# Patient Record
Sex: Female | Born: 1950
Health system: Southern US, Community
[De-identification: ages and names within clinical notes are randomized; demographics above are authoritative.]

## PROBLEM LIST (undated history)

## (undated) DIAGNOSIS — E039 Hypothyroidism, unspecified: Secondary | ICD-10-CM

## (undated) DIAGNOSIS — B9681 Helicobacter pylori [H. pylori] as the cause of diseases classified elsewhere: Secondary | ICD-10-CM

## (undated) DIAGNOSIS — E079 Disorder of thyroid, unspecified: Secondary | ICD-10-CM

## (undated) DIAGNOSIS — E785 Hyperlipidemia, unspecified: Secondary | ICD-10-CM

## (undated) DIAGNOSIS — K219 Gastro-esophageal reflux disease without esophagitis: Secondary | ICD-10-CM

## (undated) DIAGNOSIS — E119 Type 2 diabetes mellitus without complications: Secondary | ICD-10-CM

## (undated) DIAGNOSIS — K279 Peptic ulcer, site unspecified, unspecified as acute or chronic, without hemorrhage or perforation: Secondary | ICD-10-CM

## (undated) DIAGNOSIS — J449 Chronic obstructive pulmonary disease, unspecified: Secondary | ICD-10-CM

## (undated) DIAGNOSIS — G4730 Sleep apnea, unspecified: Secondary | ICD-10-CM

## (undated) DIAGNOSIS — M1990 Unspecified osteoarthritis, unspecified site: Secondary | ICD-10-CM

## (undated) DIAGNOSIS — I10 Essential (primary) hypertension: Secondary | ICD-10-CM

## (undated) HISTORY — PX: REPLACEMENT TOTAL KNEE BILATERAL: SUR1225

## (undated) HISTORY — DX: Type 2 diabetes mellitus without complications: E11.9

## (undated) HISTORY — DX: Disorder of thyroid, unspecified: E07.9

## (undated) HISTORY — DX: Helicobacter pylori (H. pylori) as the cause of diseases classified elsewhere: B96.81

## (undated) HISTORY — DX: Essential (primary) hypertension: I10

## (undated) HISTORY — DX: Peptic ulcer, site unspecified, unspecified as acute or chronic, without hemorrhage or perforation: K27.9

## (undated) HISTORY — DX: Chronic obstructive pulmonary disease, unspecified: J44.9

## (undated) HISTORY — PX: TUBAL LIGATION: SHX77

## (undated) HISTORY — DX: Hyperlipidemia, unspecified: E78.5

## (undated) HISTORY — DX: Unspecified osteoarthritis, unspecified site: M19.90

## (undated) HISTORY — PX: LAPAROSCOPIC GASTRIC BANDING: SHX1100

---

## 2004-02-04 ENCOUNTER — Other Ambulatory Visit: Admission: RE | Admit: 2004-02-04 | Discharge: 2004-02-04 | Payer: Self-pay | Admitting: Dermatology

## 2005-10-06 ENCOUNTER — Ambulatory Visit: Payer: Self-pay | Admitting: Cardiology

## 2005-10-06 ENCOUNTER — Observation Stay (HOSPITAL_COMMUNITY): Admission: EM | Admit: 2005-10-06 | Discharge: 2005-10-07 | Payer: Self-pay | Admitting: Emergency Medicine

## 2005-10-07 ENCOUNTER — Encounter: Payer: Self-pay | Admitting: Cardiology

## 2008-04-22 ENCOUNTER — Ambulatory Visit: Payer: Self-pay | Admitting: Orthopedic Surgery

## 2008-04-22 DIAGNOSIS — M1990 Unspecified osteoarthritis, unspecified site: Secondary | ICD-10-CM

## 2008-04-22 DIAGNOSIS — M545 Low back pain, unspecified: Secondary | ICD-10-CM | POA: Insufficient documentation

## 2008-04-25 ENCOUNTER — Telehealth: Payer: Self-pay | Admitting: Orthopedic Surgery

## 2008-07-22 ENCOUNTER — Encounter (INDEPENDENT_AMBULATORY_CARE_PROVIDER_SITE_OTHER): Payer: Self-pay | Admitting: *Deleted

## 2008-08-01 ENCOUNTER — Ambulatory Visit (HOSPITAL_COMMUNITY): Admission: RE | Admit: 2008-08-01 | Discharge: 2008-08-01 | Payer: Self-pay | Admitting: *Deleted

## 2008-08-11 ENCOUNTER — Ambulatory Visit (HOSPITAL_COMMUNITY): Admission: RE | Admit: 2008-08-11 | Discharge: 2008-08-11 | Payer: Self-pay | Admitting: *Deleted

## 2008-08-11 ENCOUNTER — Encounter: Admission: RE | Admit: 2008-08-11 | Discharge: 2008-08-11 | Payer: Self-pay | Admitting: *Deleted

## 2008-09-23 ENCOUNTER — Encounter: Admission: RE | Admit: 2008-09-23 | Discharge: 2008-09-23 | Payer: Self-pay | Admitting: *Deleted

## 2008-10-29 ENCOUNTER — Ambulatory Visit (HOSPITAL_COMMUNITY): Admission: RE | Admit: 2008-10-29 | Discharge: 2008-10-29 | Payer: Self-pay | Admitting: *Deleted

## 2009-02-26 ENCOUNTER — Encounter: Admission: RE | Admit: 2009-02-26 | Discharge: 2009-05-27 | Payer: Self-pay | Admitting: Surgery

## 2009-03-30 ENCOUNTER — Ambulatory Visit (HOSPITAL_COMMUNITY): Admission: RE | Admit: 2009-03-30 | Discharge: 2009-03-31 | Payer: Self-pay | Admitting: Surgery

## 2009-05-15 ENCOUNTER — Encounter: Admission: RE | Admit: 2009-05-15 | Discharge: 2009-05-15 | Payer: Self-pay | Admitting: Surgery

## 2009-06-24 ENCOUNTER — Encounter: Admission: RE | Admit: 2009-06-24 | Discharge: 2009-07-15 | Payer: Self-pay | Admitting: Surgery

## 2009-09-15 ENCOUNTER — Ambulatory Visit: Payer: Self-pay | Admitting: Vascular Surgery

## 2009-10-05 ENCOUNTER — Ambulatory Visit: Payer: Self-pay | Admitting: Vascular Surgery

## 2009-10-14 ENCOUNTER — Ambulatory Visit: Payer: Self-pay | Admitting: Vascular Surgery

## 2009-11-09 ENCOUNTER — Ambulatory Visit: Payer: Self-pay | Admitting: Vascular Surgery

## 2009-11-17 ENCOUNTER — Ambulatory Visit: Payer: Self-pay | Admitting: Vascular Surgery

## 2010-10-22 LAB — DIFFERENTIAL
Basophils Relative: 0 % (ref 0–1)
Basophils Relative: 0 % (ref 0–1)
Eosinophils Absolute: 0.1 10*3/uL (ref 0.0–0.7)
Eosinophils Absolute: 0.1 10*3/uL (ref 0.0–0.7)
Eosinophils Relative: 1 % (ref 0–5)
Eosinophils Relative: 1 % (ref 0–5)
Lymphs Abs: 2 10*3/uL (ref 0.7–4.0)
Monocytes Absolute: 0.7 10*3/uL (ref 0.1–1.0)
Monocytes Relative: 7 % (ref 3–12)
Neutro Abs: 5.3 10*3/uL (ref 1.7–7.7)
Neutro Abs: 6 10*3/uL (ref 1.7–7.7)

## 2010-10-22 LAB — GLUCOSE, CAPILLARY
Glucose-Capillary: 106 mg/dL — ABNORMAL HIGH (ref 70–99)
Glucose-Capillary: 85 mg/dL (ref 70–99)
Glucose-Capillary: 96 mg/dL (ref 70–99)

## 2010-10-22 LAB — COMPREHENSIVE METABOLIC PANEL
ALT: 23 U/L (ref 0–35)
AST: 22 U/L (ref 0–37)
Albumin: 3.7 g/dL (ref 3.5–5.2)
BUN: 28 mg/dL — ABNORMAL HIGH (ref 6–23)
CO2: 27 mEq/L (ref 19–32)
Calcium: 9.8 mg/dL (ref 8.4–10.5)
Creatinine, Ser: 0.94 mg/dL (ref 0.4–1.2)
GFR calc Af Amer: >60 mL/min (ref >60)
Glucose, Bld: 90 mg/dL (ref 70–99)
Potassium: 4.8 mEq/L (ref 3.5–5.1)
Total Bilirubin: 0.5 mg/dL (ref 0.3–1.2)
Total Protein: 7.4 g/dL (ref 6.0–8.3)

## 2010-10-22 LAB — CBC
Hemoglobin: 12.4 g/dL (ref 12.0–15.0)
MCHC: 32.6 g/dL (ref 30.0–36.0)
MCHC: 33.2 g/dL (ref 30.0–36.0)
MCV: 84.8 fL (ref 78.0–100.0)
RBC: 4.39 MIL/uL (ref 3.87–5.11)
RDW: 14 % (ref 11.5–15.5)

## 2010-10-22 LAB — HEMOGLOBIN AND HEMATOCRIT, BLOOD: Hemoglobin: 12.7 g/dL (ref 12.0–15.0)

## 2010-11-30 NOTE — Assessment & Plan Note (Signed)
OFFICE VISIT   CIIN, BRAZZEL  DOB:  [birth year]                                       11/09/2009  [id number]#:[id number]   This patient had laser ablation of her left great saphenous vein today  for severe venous hypertension with swelling, hyperpigmentation and  history of ulceration in the contralateral right leg which she has  already had treated.  Procedure was done without difficulty today and  she tolerated it well.  We ablated from mid calf to saphenofemoral  junction.  She will return in 1 week for venous duplex exam to confirm  closure.     Quita Skye Hart Rochester, M.D.  Electronically Signed   JDL/MEDQ  D:  11/09/2009  T:  11/10/2009  Job:  [id number]

## 2010-11-30 NOTE — Assessment & Plan Note (Signed)
OFFICE VISIT   TAMBI, Jennifer Rasmussen  DOB:  1951/05/21                                       10/05/2009  [id number]#:[id number]   Patient had laser ablation of her right great saphenous vein performed  today for severe venous insufficiency of the right leg with recurrent  stasis ulcers and severe hyperpigmentation of both legs.  No stab  phlebectomies were performed.  The stasis ulcer today measures about 5 x  2 cm in the pretibial area on the right.   She will return in 1 week for follow-up venous duplex exam and will have  the contralateral left leg done in the near future.     Quita Skye Hart Rochester, M.D.  Electronically Signed   JDL/MEDQ  D:  10/05/2009  T:  10/06/2009  Job:  [id number]

## 2010-11-30 NOTE — Assessment & Plan Note (Signed)
OFFICE VISIT   Jennifer, Rasmussen  DOB:  [birth year]                                       10/14/2009  [id number]#:[id number]   Here for followup today of her laser ablation of right great saphenous  vein by Dr. Hart Rochester on 10/05/2009.  She had severe venous hypertension  bilaterally.  She had the usual amount of soreness in her medial thigh.   On physical exam she has a nice healing of her pretibial wound on the  right ankle.  She has minimal soreness over her medial thigh.  She  underwent venous duplex today and this reveals closure of her great  saphenous vein and no evidence of DVT.  She will undergo left laser  ablation with Dr. Hart Rochester on 04/25 as planned.     Larina Earthly, M.D.  Electronically Signed   TFE/MEDQ  D:  10/14/2009  T:  10/15/2009  Job:  [id number]   cc:   Quita Skye. Hart Rochester, M.D.

## 2010-11-30 NOTE — Assessment & Plan Note (Signed)
OFFICE VISIT   Jennifer, Rasmussen  DOB:  [birth year]                                       11/17/2009  [id number]#:[id number]   The patient had laser ablation of her left great saphenous vein  performed on April 25, one week ago.  She had previously had the right  great saphenous vein closed on March 21.  She has a long history of  recurrent stasis ulcer in the right pretibial region and has had severe  changes of venous insufficiency in the lower third of the left leg.  The  stasis ulcer on the right leg is continuing to improve, is almost  healed, and a new Unna boot was applied today.  The ulcer measured about  0.5 x 1 cm in a triangular pattern.  The left leg is free of stasis  ulcers currently.  She continues to have chronic edema in the left leg.  She has had some mild to moderate tenderness where the laser ablation  was performed.   Venous duplex exam performed today reveals no evidence of deep venous  obstruction with total closure of the great saphenous vein from the knee  to the saphenofemoral junction.  She was reassured regarding these  findings.  She will have her Unna boots changed weekly in [place].  Blood pressure today is 133/78, heart rate 77, respirations 24.   She will return to see [place] on a p.r.n. basis.     Quita Skye Hart Rochester, M.D.  Electronically Signed   JDL/MEDQ  D:  11/17/2009  T:  11/18/2009  Job:  [id number]

## 2010-11-30 NOTE — Procedures (Signed)
LOWER EXTREMITY VENOUS REFLUX EXAM   INDICATION:  Bilateral lower extremity swelling, redness, right lower  extremity ulcer, and discoloration.   EXAM:  Using color-flow imaging and pulse Doppler spectral analysis, the  bilateral common femoral, superficial femoral, popliteal, posterior  tibial, greater and lesser saphenous veins are evaluated.  There is no  evidence suggesting deep venous insufficiency in the bilateral lower  extremities.   The bilateral saphenofemoral junction is not competent with Reflux of  >555milliseconds. The bilateral GSV is not competent with Reflux of  >523milliseconds with the caliber as described below.)   The bilateral proximal short saphenous vein demonstrates competency.   GSV Diameter (used if found to be incompetent only)                                            Right    Left  Proximal Greater Saphenous Vein           1.24 cm  1.39 cm  Proximal-to-mid-thigh                     1.12 cm  0.91 cm  Mid thigh                                 0.87 cm  0.71 cm  Mid-distal thigh                          cm       cm  Distal thigh                              0.93 cm  0.73 cm  Knee                                      0.74 cm  0.77 cm   IMPRESSION:  1. Bilateral greater saphenous vein Reflux with >526milliseconds is      identified with the caliber ranging from 0.74 cm to 1.24 cm knee to      groin on the right and 0.77 to 1.39 cm on the left.  2. The bilateral greater saphenous veins are not aneurysmal.  3. The bilateral greater saphenous veins are not tortuous.  4. The deep venous system is competent.  5. The bilateral lesser saphenous veins are competent.  6. Anterior accessory saphenous veins are identified with reflux >500      milliseconds and measures 0.72 cm at the saphenofemoral junction      and 0.92 cm proximal thigh on the right and 0.67 cm saphenofemoral      junction and 0.34 proximal thigh on the left.         ___________________________________________  Quita Skye. Hart Rochester, M.D.   CJ/MEDQ  D:  09/15/2009  T:  09/15/2009  Job:  [id number]

## 2010-11-30 NOTE — Consult Note (Signed)
NEW PATIENT CONSULTATION   Jennifer Rasmussen, Jennifer Rasmussen  DOB:  1950-07-22                                       09/15/2009  [id number]#:[id number]   The patient is a 60 year old female with severe venous insufficiency of  both lower extremities with a history of recurrent stasis ulcers and  bleeding despite the use of elastic compression stockings.  This  patient, who states that she has had severe swelling and progressive  thickening of the skin of the lower third of both legs as well as  recurrent stasis ulcers over the last 15 years, describes 4 ulcers in  the last 7 months, most recent of which has bled 3 weeks ago.  Right  side has been more affected than the left but she has severe symptoms in  both lower extremities consisting of throbbing, aching and burning  discomfort as well as progressive swelling as the day begins and it  worsens as the day proceeds.  She had no history thrombophlebitis or  deep veinous venous thrombosis but has had recurrent stasis ulcers as  noted.  Wore elastic compression stockings with some improvement as long  as she could but she now has severe degenerative joint disease and  because of her left hip problem, she is unable to flex the hip enough to  get her stockings on.  She does not take pain medications but does  elevate her legs frequently and continues to have problems despite this.   CHRONIC MEDICAL PROBLEMS:  1. Diabetes mellitus.  2. Hypertension.  3. Hyperlipidemia.  4. COPD.  5. Degenerative joint disease, in need of the left hip replacement.  6. She has no history of coronary artery disease or stroke.   FAMILY HISTORY:  Positive for coronary artery disease in her father who  died of myocardial infarction in 2003, positive for diabetes in sister.  Negative for stroke.   SOCIAL HISTORY:  She is married, is disabled and retired.  She smokes  about a pack of cigarettes per week but does not use alcohol.   Other current medical  problem is morbid obesity.  She underwent a  laparoscopic gastric banding procedure several months ago and has lost  46.  Has orthopnea and dyspnea on exertion, is on home oxygen at night,  productive cough, uses CPAP in the evenings; chronic constipation.  Leg  discomfort with walking, history of arthritis, joint pain.  All other  systems review in the review of systems are negative.   PHYSICAL EXAMINATION:  Blood pressure 135/78, heart rate 64,  respirations 14, temperature is 98.  General:  She is an obese female  who is in no apparent distress, alert and oriented x3.  HEENT:  EOMs  intact.  Conjunctivae normal.  Lungs:  Clear with some mild rhonchi.  Cardiovascular:  Regular rhythm.  No murmurs.  Carotid pulses 3+, no  audible bruits.  Abdomen:  Soft, nontender with no masses.  Musculoskeletal:  Exam reveals decreased range of motion left hip; no  other abnormalities noted.  Neurologic:  Normal.  Skin:  Exam of the  lower extremities reveals severe thickening, hyperpigmentation and  dermatosclerosis involving the lower third of the legs in a  circumferential fashion, worse on the right than the left, with an  active ulcer on the right side measuring about 5 mm in diameter.  There  is 2-3+  bilaterally with chronic stigmata of venous insufficiency.   Today I ordered a venous duplex exam of both lower extremities which I  have reviewed and interpreted.  She has no evidence of deep venous  obstruction but has gross reflux in both great saphenous veins from the  mid calf to the saphenofemoral junction including the lateral branches  bilaterally.   This patient has severe venous insufficiency with multiple ulcerations  and severe symptoms and is unable to wear elastic compression stockings  because of her hip pathology and the size of her legs.  She badly needs  laser ablation of both great saphenous veins to be performed in the near  future.  We will proceed with precertification to  hopefully heal the  ulcerations and relieve her symptoms.     Quita Skye Hart Rochester, M.D.  Electronically Signed   JDL/MEDQ  D:  09/15/2009  T:  09/16/2009  Job:  [id number]   cc:   Ernestina Penna, M.D.  Belva Agee, NP

## 2010-12-03 ENCOUNTER — Encounter: Payer: Self-pay | Admitting: Nurse Practitioner

## 2010-12-03 NOTE — H&P (Signed)
NAMESHIZUE, KASEMAN              ACCOUNT NO.:  [id number]   MEDICAL RECORD NO.:  [id number]          PATIENT TYPE:  INP   LOCATION:  2013                         FACILITY:  [facility]   PHYSICIAN:  Rollene Rotunda, M.D.   DATE OF BIRTH:  03/29/1951   DATE OF ADMISSION:  10/06/2005  DATE OF DISCHARGE:                                HISTORY & PHYSICAL   SUMMARY OF HISTORY:  Jennifer Rasmussen is a 60 year old white female who was  referred from her primary MD's office secondary to shortness of breath. Ms.  Beazer states that, since this past Tuesday, she has noticed an increased  dry cough, low grade temperature of approximately 99, chills, shortness of  breath, increased heart rate, and superior and posterior sharp pains with  coughing. She states that on Wednesday all her symptoms gradually became  worse, and she made an appointment to see Paulita Cradle in the office  today. When she got up this morning, her symptoms were particularly worse.  She was seen in the office by Paulita Cradle, nurse practitioner, and  referred to the ER for cardiology evaluation to delineate between  reoccurring bronchitis for pulmonary edema.   Ms. Valls describes chronic orthopnea. This has not changed. She also  describes chronic pedal edema for which she wears compression hose on her  feet. She states that this is not changed. She denies any weight gain, PND.  She does describe some hand swelling which she states is new for her.   She also describes a wellness visit approximately 2-3 weeks ago with Paulita Cradle. Blood work and chest x-ray were obtained at that time. Her  hydrochlorothiazide was changed to Lasix for the swelling, and this is  felt may be due to [facility] which was discontinued, and Glucophage was  started; however, the patient states that she was converted back to  [facility] because she did not like taking the Glucophage. It did not make  her feel good.   PAST MEDICAL HISTORY:  Is  notable for no known drug allergies.   MEDICATIONS:  1.  Aspirin 81 mg daily.  2.  Zocor, unknown dose at bedtime.  3.  Lasix 20 mg daily which started 2 weeks ago.  4.  Zestril 10 mg daily.  5.  Advair 258/50 twice daily.  6.  Mobic 15 mg daily.  7.  Avandaryl pill 4 daily.   She has a history of COPD. PFTs were performed on May 05, 2005, that  showed FEV1 of 70% predicted. She has been reported in the past to see Dr.  Egbert Garibaldi for remote pulmonary evaluation; history of hypertension. She does not  check her blood pressures at home.  Hyperlipidemia, last check was on September 17, 2005, showed total cholesterol 183, triglycerides 173, HDL 48, LDL 100,  obesity, type 2 diabetes diagnosed in September 2006, hemoglobin A1c in  October was 7.2; on September 20, 2005, it was 5.78. States glucose has been  less than 100 at home. She has also been diagnosed with obstructive sleep  apnea for which she is on CPAP and home oxygen at night.  Status post  bilateral knee replacement. She had peripheral vascular evaluation with Dr.  Edilia Bo, with [facility], and it sounds like she has had ABIs in the past  secondary to lower extremity ulcers. Information is currently not available.  However, based on history, it sounds like she has a diagnosis of venous  insufficiency.   SOCIAL HISTORY:  She resides in [place] with her husband. She does not have  any children. She is on disability and retired as a Curator from [employer]  She has not smoked in approximately 1 week; prior to that she smoked 1-1/2  packs per day for 20 years. She denies any alcohol, drugs, herbal  medications; maintains a low-carbohydrate, low-sugar diet. She attends [employer]  three times a week.   FAMILY HISTORY:  Her mother is deceased at the age of 42 from MS, her father  at the age of 59 after several myocardial infarctions and a history of  hypertension. She has one brother alive and well, and she has two sisters,  one of which has  diabetes.   REVIEW OF SYSTEMS:  In addition to above is notable for a headache since the  onset of symptoms, nasal discharge, and sinus drainage, glasses, upper  dental plate with loose teeth on the lower jaw. Chronic dyspnea on exertion  which has been worse lately, chronic orthopnea, negative change in baseline  wheezing, postmenopausal, numbness in the feet and hands, arthralgias in the  bilateral ankles, wrists, diarrhea this morning, some dysphagia with  liquids, and her husband states solids.   PHYSICAL EXAMINATION:  GENERAL:  Well-nourished, well-developed pleasant  white female in no apparent distress.  VITAL SIGNS: Temperature is 99.9, blood pressure is 127/64, pulse 102,  respirations 22, 93% saturation on room air. Her voice sounds very nasally  on interview.  HEENT: Is essentially unremarkable.  NECK: Supple without thyromegaly, adenopathy, carotid bruits. Due to her  obesity, it is difficult to assess JVD.  HEART:  PMI is not displaced, distant heart sounds, regular rate and rhythm.  Normal S1-S2. I do not appreciate S3-S4, murmurs, rubs, clicks or gallops.  All pulses are symmetrical and intact without abdominal bruits.  CHEST: Symmetrical excursion. She has diminished breath sounds throughout,  especially the bases; right is worse than left. She also has dry crackles in  the bases, right worse than the left. She has upper anterior expiratory  wheezing.  SKIN:  Integument is intact. However, she has significant venous stasis  changes on her lower extremities, right worse than the left.  ABDOMEN: Morbidly obese. Bowel sounds present without organomegaly, masses,  tenderness.  EXTREMITIES: No cyanosis, clubbing. She has trace to 1+ chronic nonpitting  edema, right worse than the left, which is not changed for the patient  (compression hose removed).  MUSCULOSKELETAL: Unremarkable.  NEUROLOGIC: Unremarkable.  CHEST X-RAY:  Obtained from [facility] shows  cardiomegaly  and vascular congestion in bilateral bases, right worse than the left;  question of bronchitis versus pulmonary edema.   EKG shows sinus tachycardia, nonspecific ST-T wave changes, diffuse  artifact, PVCs.   Labs are pending.   IMPRESSION:  1.  Shortness of breath of uncertain etiology. Could be acute on top of      chronic lung disease versus pulmonary edema.  2.  Morbid obesity.  3.  Chronic obstructive pulmonary disease with recent tobacco cessation,      history as noted per past medical history.   DISPOSITION:  Dr. Antoine Poche reviewed the patient's history, spoke with  and  examined the patient, and agrees with the above. We will admit the patient  for observation, begin IV antibiotics, her home medications, as well as  Mucinex and albuterol breathing treatments. Dr. Antoine Poche feels that her  shortness of breath and cough are primary lung issues rather than cardiac  issues. We will check a BNP and echocardiogram and gentle IV diuresis. If  she does not improve, will consider a pulmonary consultation. If warranted,  we will pursue ischemic evaluation.      Joellyn Rued, P.A. [facility]    ______________________________  Rollene Rotunda, M.D.    EW/MEDQ  D:  10/06/2005  T:  10/07/2005  Job:  [id number]   cc:   Ernestina Penna, M.D.  Fax: [phone]   Di Kindle. Edilia Bo, M.D.  [address]  [place]  [place] [address]

## 2010-12-03 NOTE — Procedures (Signed)
DUPLEX DEEP VENOUS EXAM - LOWER EXTREMITY   INDICATION:  Status post right greater saphenous vein laser ablation.   HISTORY:  Edema:  Yes.  Trauma/Surgery:  Right greater saphenous vein laser ablation on  10/05/2009 by Dr. Hart Rochester.  Pain:  Right thigh tenderness.  PE:  No.  Previous DVT:  No.  Anticoagulants:  Other:   DUPLEX EXAM:                CFV   SFV   PopV  PTV    GSV                R  L  R  L  R  L  R   L  R  L  Thrombosis    o  o  o     o            +  Spontaneous   +  +  +     +            o  Phasic        +  +  +     +            o  Augmentation  +  +  +     +            o  Compressible  +  +  +     +            o  Competent     o  o  o     o            o   Legend:  + - yes  o - no  p - partial  D - decreased   IMPRESSION:  1. No evidence of right lower extremity deep vein thrombosis.  2. Total occlusion of the right greater saphenous vein extending from      the distal insertion site to the lateral accessory saphenous vein.  3. Reflux is noted in the right saphenofemoral junction and the      lateral accessory saphenous vein.  4. Deep venous reflux is noted, as described above.  5. Unable to evaluate the right calf veins due to bandaging.    _____________________________  Larina Earthly, M.D.   CH/MEDQ  D:  10/15/2009  T:  10/15/2009  Job:  [id number]

## 2010-12-03 NOTE — Discharge Summary (Signed)
NAMEAARTI, MANKOWSKI              ACCOUNT NO.:  [id number]   MEDICAL RECORD NO.:  [id number]          PATIENT TYPE:  INP   LOCATION:  2013                         FACILITY:  [facility]   PHYSICIAN:  Rene Paci, M.D. LHCDATE OF BIRTH:  [birth year]   DATE OF ADMISSION:  10/06/2005  DATE OF DISCHARGE:  10/07/2005                           DISCHARGE SUMMARY - REFERRING   HISTORY OF PRESENT ILLNESS:  Jennifer Rasmussen is a 60 year old white female who  is transferred from her primary care physician's office secondary to  shortness of breath. The patient describes since the preceding Tuesday, she  has had a dry cough, low-grade fever of approximately 99, chills, increased  shortness of breath, increased heart rate, and sharp chest pains in her  chest and back with coughing. She states that on Wednesday and especially  today, her symptoms have progressively worsened. On the day prior to  admission, she had called to arrange an appointment with her physician.  After being seen in the office by Birdena Jubilee, it was felt that she should  be evaluated by cardiology for possible heart failure.   Ms. Bardwell describes a chronic history of orthopnea, which has not changed  recently. Her chronic dyspnea on exertion has worsened recently. She also  states that approximately 2 weeks ago, her Avandaril was discontinued  secondary to swelling, to Glucophage. However, she did not like the  Glucophage and this was changed back to the Avandaril. She denied any edema  or weight changes otherwise.   PAST MEDICAL HISTORY:  1.  Notable for COPD with PFT's on May 05, 2005.  2.  Hypertension, which she does not check at home.  3.  Hyperlipidemia. Last checked on September 17, 2005 showed a total cholesterol      of 183, triglycerides 173, HDL 48, LDL 100.  4.  Morbid obesity.  5.  Type 2 diabetes, diagnosed in September 2006. Hemoglobin A1C has      significantly improved from October, at which time it was 7.2  and in      March, it was 5.7.  6.  Obstructive sleep apnea, for which she is on CPAP and oxygen in the      evening.   LABORATORY DATA:  Chest x-ray provided by her primary care physician's  office showed changes in both bases, right worse than the left. Not specific  pulmonary edema pattern.   EKG showed normal sinus rhythm, normal axis, and was unremarkable. Her  initial EKG showed some PVC's.   CK MB's and troponin's were negative x3. BNP was 36. TSH was 1.728.  Admission sodium was 138, potassium 3.8, BUN 11, creatinine 1.0, glucose 50,  normal LFT's. Hemoglobin and hematocrit was 11.6 and 34.2. Normal indices.  Platelets 201,000. White blood cell count 3.8. PTT 12.9, PT 31. D-dimer was  slightly elevated at 0.82.   HOSPITAL COURSE:  Ms. Griffin was admitted to the Unit 2000 on p.o.  Zithromax, albuterol nebulizers. She did receive IV Lasix in the emergency  room. Overnight, her breathing has improved. Her lungs did show a few  scattered crackles, but her wheezing had  resolved. She remained febrile with  a temperature of 101.3. Enzymes and EKG's were negative for myocardial  infarction. BNP and again, physical exam, did not show any active evidence  of congestive heart failure. Echocardiogram was performed and showed an  ejection fraction of approximately 65% without wall motion abnormality. She  had a trivial pericardial effusion. After review, it was felt that she could  be discharged home with further outpatient followup with her primary care  physician.   DISCHARGE DIAGNOSES:  1.  Dyspnea without any active evidence of congestive heart failure,      probable bronchitis.  2.  History of morbid obesity.  3.  Hypertension.  4.  Diabetes.  5.  Hyperlipidemia.   DISPOSITION:  She is discharged home.   DIET:  Asked to maintain a low-salt, low-fat, low-cholesterol, ADA diet.   ACTIVITY:  Not restricted.   SPECIAL INSTRUCTIONS:  She was asked to bring all medications to  all  appointments. She was advised NO smoking or tobacco products.   MEDICATIONS:  New prescriptions include:  1.  Zithromax 250 mg daily for 4 days.  2.  Mucinex 600 mg b.i.d. for the next 10 days.  She was asked to continue her home medications, which include:  1.  Aspirin 81 mg daily.  2.  Zocor, unknown dosage, q.h.s.  3.  Lasix 20 mg daily.  4.  Zestril 10 mg daily.  5.  Advair 250/50 b.i.d.  6.  Mobic 15 mg daily.  7.  Avandaril 4/4 daily.   FOLLOW UP:  She was asked to call [facility] for a  followup appointment next week.   After reviewing with Dr. Antoine Poche, given her pulmonary symptoms and her  elevated D-dimer, spiral CT scan will be performed prior to her leaving the  hospital. If the CT scan does not show any evidence of pulmonary embolism,  she will be discharged as described above.      Joellyn Rued, P.A. [facility]      Rene Paci, M.D. [facility]  Electronically Signed    EW/MEDQ  D:  10/07/2005  T:  10/09/2005  Job:  [id number]   cc:   [facility]

## 2010-12-03 NOTE — Procedures (Signed)
DUPLEX DEEP VENOUS EXAM - LOWER EXTREMITY   INDICATION:  Left greater saphenous vein ablation.   HISTORY:  Edema:  Yes  Trauma/Surgery:  Left greater saphenous vein ablation.  Pain:  Yes  PE:  No  Previous DVT:  No  Anticoagulants:  No  Other:  No   DUPLEX EXAM:                CFV   SFV   PopV  PTV         GSV                R  L  R  L  R  L  R   L       R  L  Thrombosis    0  0     0     0      Not visualized          +  Spontaneous   +  +     +     +      Not visualized          0  Phasic        +  +     +     +      Not visualized          0  Augmentation  +  +     +     +      Not visualized          0  Compressible  +  +     +     +      Not visualized          0  Competent     +  +     +     +      Not visualized          0   Legend:  + - yes  o - no  p - partial  D - decreased   IMPRESSION:  There does not appear to be any deep venous thrombus noted  in the left leg.  The left greater saphenous vein appears thrombosed  proximally to the level of the knee.  The left posterior tibial vein was  not visualized adequately.    _____________________________  Quita Skye Hart [place], M.D.   CB/MEDQ  D:  11/18/2009  T:  11/18/2009  Job:  [id number]

## 2011-09-20 ENCOUNTER — Telehealth (INDEPENDENT_AMBULATORY_CARE_PROVIDER_SITE_OTHER): Payer: Self-pay | Admitting: Surgery

## 2011-09-20 NOTE — Telephone Encounter (Signed)
09/20/11 mailed recall letter for bariatric surgery follow-up. Advised the patient to call [facility] @ [phone] to schedule an appointment...cef °

## 2011-10-18 DIAGNOSIS — I10 Essential (primary) hypertension: Secondary | ICD-10-CM | POA: Diagnosis not present

## 2011-10-18 DIAGNOSIS — E119 Type 2 diabetes mellitus without complications: Secondary | ICD-10-CM | POA: Diagnosis not present

## 2011-12-29 DIAGNOSIS — I872 Venous insufficiency (chronic) (peripheral): Secondary | ICD-10-CM | POA: Diagnosis not present

## 2012-01-03 DIAGNOSIS — I872 Venous insufficiency (chronic) (peripheral): Secondary | ICD-10-CM | POA: Diagnosis not present

## 2012-01-09 DIAGNOSIS — I872 Venous insufficiency (chronic) (peripheral): Secondary | ICD-10-CM | POA: Diagnosis not present

## 2012-01-13 DIAGNOSIS — I872 Venous insufficiency (chronic) (peripheral): Secondary | ICD-10-CM | POA: Diagnosis not present

## 2012-01-18 DIAGNOSIS — J441 Chronic obstructive pulmonary disease with (acute) exacerbation: Secondary | ICD-10-CM | POA: Diagnosis not present

## 2012-01-18 DIAGNOSIS — I10 Essential (primary) hypertension: Secondary | ICD-10-CM | POA: Diagnosis not present

## 2012-01-23 DIAGNOSIS — I872 Venous insufficiency (chronic) (peripheral): Secondary | ICD-10-CM | POA: Diagnosis not present

## 2012-04-17 DIAGNOSIS — E119 Type 2 diabetes mellitus without complications: Secondary | ICD-10-CM | POA: Diagnosis not present

## 2012-04-17 DIAGNOSIS — I10 Essential (primary) hypertension: Secondary | ICD-10-CM | POA: Diagnosis not present

## 2012-07-23 DIAGNOSIS — J441 Chronic obstructive pulmonary disease with (acute) exacerbation: Secondary | ICD-10-CM | POA: Diagnosis not present

## 2012-07-23 DIAGNOSIS — E119 Type 2 diabetes mellitus without complications: Secondary | ICD-10-CM | POA: Diagnosis not present

## 2012-10-18 ENCOUNTER — Other Ambulatory Visit: Payer: Self-pay | Admitting: *Deleted

## 2012-10-18 NOTE — Telephone Encounter (Signed)
Needs to be seen

## 2012-10-18 NOTE — Telephone Encounter (Signed)
Last seen by [facility] 07/23/12, last filled 08/31/12. You saw her 04/13. If approved have nurse call into [facility] pharmacy

## 2012-10-18 NOTE — Telephone Encounter (Signed)
Pt.notified

## 2012-10-19 ENCOUNTER — Other Ambulatory Visit: Payer: Self-pay | Admitting: Nurse Practitioner

## 2012-10-19 ENCOUNTER — Telehealth: Payer: Self-pay | Admitting: Nurse Practitioner

## 2012-10-19 MED ORDER — TRAMADOL HCL 50 MG PO TABS: 50.0000 mg | ORAL_TABLET | Freq: Two times a day (BID) | ORAL | Status: DC

## 2012-10-19 NOTE — Telephone Encounter (Signed)
Patient aware rx sent  

## 2012-10-19 NOTE — Telephone Encounter (Signed)
RX for tramadol sent to [facility]

## 2012-10-19 NOTE — Telephone Encounter (Signed)
Please advise 

## 2012-10-24 ENCOUNTER — Encounter: Payer: Self-pay | Admitting: Nurse Practitioner

## 2012-10-24 ENCOUNTER — Ambulatory Visit (INDEPENDENT_AMBULATORY_CARE_PROVIDER_SITE_OTHER): Payer: BC Managed Care – PPO | Admitting: Nurse Practitioner

## 2012-10-24 ENCOUNTER — Other Ambulatory Visit: Payer: Self-pay

## 2012-10-24 VITALS — BP 146/72 | HR 79 | Temp 98.2°F | Ht 64.0 in | Wt 330.0 lb

## 2012-10-24 DIAGNOSIS — E119 Type 2 diabetes mellitus without complications: Secondary | ICD-10-CM | POA: Diagnosis not present

## 2012-10-24 DIAGNOSIS — E1165 Type 2 diabetes mellitus with hyperglycemia: Secondary | ICD-10-CM

## 2012-10-24 DIAGNOSIS — I10 Essential (primary) hypertension: Secondary | ICD-10-CM

## 2012-10-24 DIAGNOSIS — R609 Edema, unspecified: Secondary | ICD-10-CM | POA: Insufficient documentation

## 2012-10-24 DIAGNOSIS — E118 Type 2 diabetes mellitus with unspecified complications: Secondary | ICD-10-CM

## 2012-10-24 DIAGNOSIS — E039 Hypothyroidism, unspecified: Secondary | ICD-10-CM | POA: Insufficient documentation

## 2012-10-24 DIAGNOSIS — E1159 Type 2 diabetes mellitus with other circulatory complications: Secondary | ICD-10-CM | POA: Insufficient documentation

## 2012-10-24 DIAGNOSIS — J441 Chronic obstructive pulmonary disease with (acute) exacerbation: Secondary | ICD-10-CM

## 2012-10-24 LAB — BASIC METABOLIC PANEL WITH GFR
Chloride: 102 mEq/L (ref 96–112)
Creat: 0.82 mg/dL (ref 0.50–1.10)
GFR, Est African American: 88 mL/min
GFR, Est Non African American: 76 mL/min

## 2012-10-24 LAB — THYROID PANEL WITH TSH: T4, Total: 11.4 ug/dL (ref 5.0–12.5)

## 2012-10-24 LAB — HEPATIC FUNCTION PANEL
Albumin: 3.7 g/dL (ref 3.5–5.2)
Alkaline Phosphatase: 132 U/L — ABNORMAL HIGH (ref 39–117)
Bilirubin, Direct: 0.1 mg/dL (ref 0.0–0.3)
Total Bilirubin: 0.4 mg/dL (ref 0.3–1.2)

## 2012-10-24 LAB — POCT GLYCOSYLATED HEMOGLOBIN (HGB A1C): Hemoglobin A1C: 7.9

## 2012-10-24 MED ORDER — TRAMADOL HCL 50 MG PO TABS: ORAL_TABLET | ORAL | Status: DC

## 2012-10-24 NOTE — Patient Instructions (Signed)
Diets for Diabetes, Food Labeling Look at food labels to help you decide how much of a product you can eat. You will want to check the amount of total carbohydrate in a serving to see how the food fits into your meal plan. In the list of ingredients, the ingredient present in the largest amount by weight must be listed first, followed by the other ingredients in descending order. STANDARD OF IDENTITY Most products have a list of ingredients. However, foods that the Food and Drug Administration (FDA) has given a standard of identity do not need a list of ingredients. A standard of identity means that a food must contain certain ingredients if it is called a particular name. Examples are mayonnaise, peanut butter, ketchup, jelly, and cheese. LABELING TERMS There are many terms found on food labels. Some of these terms have specific definitions. Some terms are regulated by the FDA, and the FDA has clearly specified how they can be used. Others are not regulated or well-defined and can be misleading and confusing. SPECIFICALLY DEFINED TERMS Nutritive Sweetener.  A sweetener that contains calories,such as table sugar or honey. Nonnutritive Sweetener.  A sweetener with few or no calories,such as saccharin, aspartame, sucralose, and cyclamate. LABELING TERMS REGULATED BY THE FDA Free.  The product contains only a tiny or small amount of fat, cholesterol, sodium, sugar, or calories. For example, a "fat-free" product will contain less than 0.5 g of fat per serving. Low.  A food described as "low" in fat, saturated fat, cholesterol, sodium, or calories could be eaten fairly often without exceeding dietary guidelines. For example, "low in fat" means no more than 3 g of fat per serving. Lean.  "Lean" and "extra lean" are U.S. Department of Agriculture (USDA) terms for use on meat and poultry products. "Lean" means the product contains less than 10 g of fat, 4 g of saturated fat, and 95 mg of cholesterol  per serving. "Lean" is not as low in fat as a product labeled "low." Extra Lean.  "Extra lean" means the product contains less than 5 g of fat, 2 g of saturated fat, and 95 mg of cholesterol per serving. While "extra lean" has less fat than "lean," it is still higher in fat than a product labeled "low." Reduced, Less, Fewer.  A diet product that contains 25% less of a nutrient or calories than the regular version. For example, hot dogs might be labeled "25% less fat than our regular hot dogs." Light/Lite.  A diet product that contains  fewer calories or  the fat of the original. For example, "light in sodium" means a product with  the usual sodium. More.  One serving contains at least 10% more of the daily value of a vitamin, mineral, or fiber than usual. Good Source Of.  One serving contains 10% to 19% of the daily value for a particular vitamin, mineral, or fiber. Excellent Source Of.  One serving contains 20% or more of the daily value for a particular nutrient. Other terms used might be "high in" or "rich in." Enriched or Fortified.  The product contains added vitamins, minerals, or protein. Nutrition labeling must be used on enriched or fortified foods. Imitation.  The product has been altered so that it is lower in protein, vitamins, or minerals than the usual food,such as imitation peanut butter. Total Fat.  The number listed is the total of all fat found in a serving of the product. Under total fat, food labels must list saturated fat and   trans fat, which are associated with raising bad cholesterol and an increased risk of heart blood vessel disease. Saturated Fat.  Mainly fats from animal-based sources. Some examples are red meat, cheese, cream, whole milk, and coconut oil. Trans Fat.  Found in some fried snack foods, packaged foods, and fried restaurant foods. It is recommended you eat as close to 0 g of trans fat as possible, since it raises bad cholesterol and lowers  good cholesterol. Polyunsaturated and Monounsaturated Fats.  More healthful fats. These fats are from plant sources. Total Carbohydrate.  The number of carbohydrate grams in a serving of the product. Under total carbohydrate are listed the other carbohydrate sources, such as dietary fiber and sugars. Dietary Fiber.  A carbohydrate from plant sources. Sugars.  Sugars listed on the label contain all naturally occurring sugars as well as added sugars. LABELING TERMS NOT REGULATED BY THE FDA Sugarless.  Table sugar (sucrose) has not been added. However, the manufacturer may use another form of sugar in place of sucrose to sweeten the product. For example, sugar alcohols are used to sweeten foods. Sugar alcohols are a form of sugar but are not table sugar. If a product contains sugar alcohols in place of sucrose, it can still be labeled "sugarless." Low Salt, Salt-Free, Unsalted, No Salt, No Salt Added, Without Added Salt.  Food that is usually processed with salt has been made without salt. However, the food may contain sodium-containing additives, such as preservatives, leavening agents, or flavorings. Natural.  This term has no legal meaning. Organic.  Foods that are certified as organic have been inspected and approved by the USDA to ensure they are produced without pesticides, fertilizers containing synthetic ingredients, bioengineering, or ionizing radiation. Document Released: 07/07/2003 Document Revised: 09/26/2011 Document Reviewed: 01/22/2009 ExitCare Patient Information 2013 [employer].  

## 2012-10-24 NOTE — Progress Notes (Signed)
Subjective:    Patient ID: Jennifer Rasmussen, female    DOB: 04/12/1951, 62 y.o.   MRN: [id number]  Diabetes She presents for her follow-up diabetic visit. She has type 2 diabetes mellitus. No MedicAlert identification noted. Her disease course has been stable. Hypoglycemia symptoms include sweats. Pertinent negatives for hypoglycemia include no dizziness, headaches, hunger, mood changes or seizures. Pertinent negatives for diabetes include no blurred vision, no chest pain, no foot paresthesias, no visual change and no weakness. Pertinent negatives for hypoglycemia complications include no blackouts and no hospitalization. Symptoms are stable. Pertinent negatives for diabetic complications include no CVA. Risk factors for coronary artery disease include diabetes mellitus, dyslipidemia, family history, obesity, hypertension, post-menopausal and tobacco exposure. Current diabetic treatment includes oral agent (monotherapy). She is compliant with treatment all of the time. Her weight is stable. She has not had a previous visit with a dietician. She rarely participates in exercise. There is no change in her home blood glucose trend. Her breakfast blood glucose is taken between 8-9 am. Her breakfast blood glucose range is generally 90-110 mg/dl. Her dinner blood glucose range is generally 110-130 mg/dl. An ACE inhibitor/angiotensin II receptor blocker is being taken. She sees a podiatrist (Febuary 2014).Eye exam is current [phone]).  Hypertension This is a chronic problem. The current episode started more than 1 year ago. The problem is unchanged. The problem is controlled. Associated symptoms include shortness of breath and sweats. Pertinent negatives include no anxiety, blurred vision, chest pain or headaches. Agents associated with hypertension include thyroid hormones. Risk factors for coronary artery disease include diabetes mellitus, dyslipidemia, family history, obesity, post-menopausal state and sedentary  lifestyle. Past treatments include ACE inhibitors, beta blockers and diuretics. The current treatment provides mild improvement. Hypertensive end-organ damage includes a thyroid problem. There is no history of CVA or heart failure. Identifiable causes of hypertension include sleep apnea.  Hyperlipidemia This is a chronic problem. The current episode started more than 1 year ago. The problem is controlled. Recent lipid tests were reviewed and are high. Exacerbating diseases include diabetes, hypothyroidism and obesity. Factors aggravating her hyperlipidemia include fatty foods. Associated symptoms include shortness of breath. Pertinent negatives include no chest pain. Current antihyperlipidemic treatment includes statins. The current treatment provides mild improvement of lipids. Compliance problems include adherence to diet and adherence to exercise.  Risk factors for coronary artery disease include diabetes mellitus, dyslipidemia, family history, hypertension, obesity and post-menopausal.  COPD Chronic medical problem. Doing good. Gets SOB with exertion but no worse than usual.   - Patient here today for Follow-up of chronic medical problems. Her chronic medical problem list is very long and she is on multiple medications.     Review of Systems  Eyes: Negative for blurred vision.  Respiratory: Positive for shortness of breath.   Cardiovascular: Negative for chest pain.  Musculoskeletal: Positive for joint swelling and gait problem.  Neurological: Negative for dizziness, seizures, weakness and headaches.  Psychiatric/Behavioral: Negative.   All other systems reviewed and are negative.       Objective:   Physical Exam  Constitutional: She appears well-developed and well-nourished.  HENT:  Head: Normocephalic.  Eyes: EOM are normal. Pupils are equal, round, and reactive to light.  Neck: Normal range of motion. Neck supple. No JVD present. Carotid bruit is not present.  Cardiovascular:  Normal rate.   Murmur (2/6 systolic) heard. 1-2(+) pedal pulses bil. Compression hose in place  Pulmonary/Chest: Effort normal and breath sounds normal.  Abdominal: Soft. Bowel sounds are normal.  Musculoskeletal: Normal range of motion.  Skin: Skin is dry.   BP 146/72  Pulse 79  Temp(Src) 98.2 F (36.8 C) (Oral)  Ht 5\' 4"  (1.626 m)  Wt 330 lb (149.687 kg)  BMI 56.62 kg/m2  LMP 10/24/2001 Results for orders placed in visit on 10/24/12  POCT GLYCOSYLATED HEMOGLOBIN (HGB A1C)      Result Value Range   Hemoglobin A1C 7.9            Assessment & Plan:  Diabetes - Plan: POCT glycosylated hemoglobin (Hb A1C)  Unspecified hypothyroidism - Plan: Thyroid Panel With TSH  HTN (hypertension) - Plan: BASIC METABOLIC PANEL WITH GFR, Hepatic function panel, NMR Lipoprofile with Lipids  COPD exacerbation  Essential hypertension, benign  Type II or unspecified type diabetes mellitus with unspecified complication, uncontrolled  Morbid obesity  Peripheral edema  Patient refuses anther diabetic med- Wants to try diet for now Keep diary of blood sugars Continue all current meds Labs pending Mary-Margaret Daphine Deutscher, FNP

## 2012-10-25 LAB — NMR LIPOPROFILE WITH LIPIDS
Cholesterol, Total: 161 mg/dL (ref <200)
HDL Size: 8.8 nm — ABNORMAL LOW (ref >=9.2)
HDL-C: 46 mg/dL (ref >=40)
LDL Particle Number: 1116 nmol/L — ABNORMAL HIGH (ref <1000)
Triglycerides: 97 mg/dL (ref <150)
VLDL Size: 54 nm — ABNORMAL HIGH (ref <=46.6)

## 2012-10-26 ENCOUNTER — Telehealth (INDEPENDENT_AMBULATORY_CARE_PROVIDER_SITE_OTHER): Payer: Self-pay | Admitting: Surgery

## 2012-10-26 NOTE — Telephone Encounter (Signed)
10/26/12 lm and mailed recall letter for pt to schedule a bariatric follow-up appt. Dr. Daphine Deutscher did lap band surgery 03/30/09.

## 2012-10-30 ENCOUNTER — Other Ambulatory Visit: Payer: Self-pay | Admitting: Nurse Practitioner

## 2012-11-06 NOTE — Telephone Encounter (Signed)
Patient called today stating that the script that was sent in the dosage was changed from 2 pills 3 times a day to 2 pills twice daily. The patient states that this does not work to manage her pain. She states that without the dose in the middle of the day her pain is unbareable by 6pm and she is now starting to have difficulty sleeping because of it. Patient would like the dose to be put back up to where it was.

## 2012-11-06 NOTE — Telephone Encounter (Signed)
PLEASE SEE RECENT NOTE

## 2012-11-06 NOTE — Telephone Encounter (Signed)
COrrect rx was sent stating 2 pils BID but she was given 180 pills which will allowher to take 2 TID. SHe should not need anymore pills

## 2012-11-06 NOTE — Telephone Encounter (Signed)
Tramadol was filled 10/24/12

## 2012-11-06 NOTE — Telephone Encounter (Signed)
PT STATES SHE WAS TAKING 2 PILLS 3XDAY AND THE RX SENT ON THE 9TH WAS ONLY FOR 2PILLS TWICE DAILY

## 2012-11-07 NOTE — Telephone Encounter (Signed)
rx sent in 

## 2012-11-08 ENCOUNTER — Telehealth: Payer: Self-pay | Admitting: Nurse Practitioner

## 2012-11-08 NOTE — Telephone Encounter (Signed)
tramodal was lowered to 4 a day was taking 6 a day so this taking 4 a day is not working she wants to go back on 6 a day everything is in more pain can she please go back to what she was on. She uses [facility]

## 2012-11-08 NOTE — Telephone Encounter (Signed)
Rx written incorrectly. Patient takes 2 TID and RX was sent in for 2BID. Patient not out right now. But will run aout early and would liked changed back when runs out.

## 2012-11-29 ENCOUNTER — Other Ambulatory Visit: Payer: Self-pay | Admitting: *Deleted

## 2012-11-29 ENCOUNTER — Telehealth: Payer: Self-pay | Admitting: *Deleted

## 2012-11-29 MED ORDER — TRAMADOL HCL 50 MG PO TABS: ORAL_TABLET | ORAL | Status: DC

## 2012-11-29 NOTE — Telephone Encounter (Signed)
rx printed per mmm- pt aware that it is up front and husband is coming to pick it up

## 2012-12-06 ENCOUNTER — Other Ambulatory Visit: Payer: Self-pay

## 2012-12-06 MED ORDER — CARVEDILOL 12.5 MG PO TABS: 12.5000 mg | ORAL_TABLET | Freq: Every day | ORAL | Status: DC

## 2012-12-06 MED ORDER — GLIMEPIRIDE 4 MG PO TABS: 4.0000 mg | ORAL_TABLET | Freq: Every day | ORAL | Status: DC

## 2012-12-06 MED ORDER — MELOXICAM 15 MG PO TABS: 15.0000 mg | ORAL_TABLET | Freq: Every day | ORAL | Status: DC

## 2012-12-06 MED ORDER — LISINOPRIL 10 MG PO TABS: 10.0000 mg | ORAL_TABLET | Freq: Every day | ORAL | Status: DC

## 2012-12-14 ENCOUNTER — Telehealth: Payer: Self-pay | Admitting: Family Medicine

## 2012-12-14 NOTE — Telephone Encounter (Signed)
Samples of symbicort given- pt aware

## 2012-12-28 ENCOUNTER — Other Ambulatory Visit: Payer: Self-pay | Admitting: Nurse Practitioner

## 2012-12-31 NOTE — Telephone Encounter (Signed)
Last filled for #180 11/29/12, last seen 10/24/12.

## 2013-01-14 ENCOUNTER — Other Ambulatory Visit: Payer: Self-pay | Admitting: *Deleted

## 2013-01-14 MED ORDER — LEVOTHYROXINE SODIUM 50 MCG PO TABS: 50.0000 ug | ORAL_TABLET | Freq: Every day | ORAL | Status: DC

## 2013-01-23 ENCOUNTER — Ambulatory Visit: Payer: BC Managed Care – PPO | Admitting: Nurse Practitioner

## 2013-01-24 ENCOUNTER — Encounter: Payer: Self-pay | Admitting: Family Medicine

## 2013-01-24 ENCOUNTER — Ambulatory Visit (INDEPENDENT_AMBULATORY_CARE_PROVIDER_SITE_OTHER): Payer: BC Managed Care – PPO | Admitting: Family Medicine

## 2013-01-24 VITALS — BP 168/85 | HR 93 | Temp 98.1°F | Ht 64.0 in | Wt 356.0 lb

## 2013-01-24 DIAGNOSIS — I10 Essential (primary) hypertension: Secondary | ICD-10-CM

## 2013-01-24 DIAGNOSIS — E785 Hyperlipidemia, unspecified: Secondary | ICD-10-CM

## 2013-01-24 DIAGNOSIS — G894 Chronic pain syndrome: Secondary | ICD-10-CM | POA: Diagnosis not present

## 2013-01-24 DIAGNOSIS — E1165 Type 2 diabetes mellitus with hyperglycemia: Secondary | ICD-10-CM

## 2013-01-24 LAB — LIPID PANEL
Cholesterol: 166 mg/dL (ref 0–200)
HDL: 48 mg/dL (ref >39)
LDL Cholesterol: 95 mg/dL (ref 0–99)
Total CHOL/HDL Ratio: 3.5 Ratio
Triglycerides: 114 mg/dL (ref <150)
VLDL: 23 mg/dL (ref 0–40)

## 2013-01-24 LAB — POCT GLYCOSYLATED HEMOGLOBIN (HGB A1C): Hemoglobin A1C: 8.2

## 2013-01-24 MED ORDER — GLIMEPIRIDE 4 MG PO TABS: 4.0000 mg | ORAL_TABLET | Freq: Every day | ORAL | Status: DC

## 2013-01-24 MED ORDER — TRAMADOL HCL 50 MG PO TABS: 50.0000 mg | ORAL_TABLET | Freq: Four times a day (QID) | ORAL | Status: DC | PRN

## 2013-01-24 MED ORDER — SIMVASTATIN 20 MG PO TABS: 40.0000 mg | ORAL_TABLET | Freq: Every day | ORAL | Status: DC

## 2013-01-24 NOTE — Patient Instructions (Addendum)

## 2013-01-24 NOTE — Progress Notes (Signed)
  Subjective:    Patient ID: Jennifer Rasmussen, female    DOB: Jan 31, 1951, 62 y.o.   MRN: [id number]  HPI  This 62 y.o. female presents for evaluation of follow up on diabetes, hypertension, hyperlipidemia, and chronic Pain DJD and arthritis.  She states she has not been following her diabetic diet like she should and has been Eating too much cookies.  She has been having some back pain, knee pain, and bilateral hip pain and takes 6  Tramadol a day for pain control.  She has seen orthopedics and states she was offered hip surgery but she is afraid Of having this.  Her mobility is limited from this.  She has hx of Copd and denies any exacerbation.  She is using symbicort and this is helping.  Review of Systems No chest pain, SOB, HA, dizziness, vision change, N/V, diarrhea, constipation, dysuria, urinary urgency or frequency, myalgias, arthralgias or rash.     Objective:   Physical Exam  Vital signs noted  Obese Well developed well nourished female.  HEENT - Head atraumatic Normocephalic                Eyes - PERRLA, Conjuctiva - clear Sclera- Clear EOMI                Throat - oropharanx wnl Respiratory - Lungs bilateral wheezes  Cardiac - RRR S1 and S2 without murmur GI - Abdomen soft Nontender and bowel sounds active x 4 Extremities - Pre tibial edema bilat. Neuro - Grossly intact. MS- TTP bilateral hips and knees.      Assessment & Plan:  Diabetes type 2, uncontrolled - Plan: POCT glycosylated hemoglobin (Hb A1C), glimepiride (AMARYL) 4 MG tablet.   Hypertension - Plan: COMPLETE METABOLIC PANEL WITH GFR, BP is 130/80 bilateral.  Hyperlipidemia - Plan: Lipid panel, simvastatin (ZOCOR) 40mg   Chronic pain syndrome - Plan: traMADol (ULTRAM) 50 MG tablet.  Follow up prn

## 2013-01-25 ENCOUNTER — Telehealth: Payer: Self-pay | Admitting: Family Medicine

## 2013-01-25 LAB — COMPLETE METABOLIC PANEL WITH GFR
ALT: 12 U/L (ref 0–35)
AST: 10 U/L (ref 0–37)
Albumin: 3.7 g/dL (ref 3.5–5.2)
Alkaline Phosphatase: 125 U/L — ABNORMAL HIGH (ref 39–117)
BUN: 13 mg/dL (ref 6–23)
CO2: 29 mEq/L (ref 19–32)
Calcium: 9.5 mg/dL (ref 8.4–10.5)
Chloride: 101 mEq/L (ref 96–112)
Creat: 0.86 mg/dL (ref 0.50–1.10)
GFR, Est African American: 83 mL/min
GFR, Est Non African American: 72 mL/min
Glucose, Bld: 136 mg/dL — ABNORMAL HIGH (ref 70–99)
Potassium: 5 mEq/L (ref 3.5–5.3)
Sodium: 139 mEq/L (ref 135–145)
Total Bilirubin: 0.5 mg/dL (ref 0.3–1.2)
Total Protein: 7.2 g/dL (ref 6.0–8.3)

## 2013-01-28 ENCOUNTER — Telehealth: Payer: Self-pay | Admitting: Family Medicine

## 2013-01-28 NOTE — Telephone Encounter (Signed)
TRAMODAL NUMBER 180 NEEDS TO SAY 1 TO 2 PILLS EVERY 4TO 6 AS NEED FOR PAIN NEEDS TO BEEN SENT TO [facility]

## 2013-01-29 ENCOUNTER — Other Ambulatory Visit: Payer: Self-pay | Admitting: Family Medicine

## 2013-01-29 DIAGNOSIS — G894 Chronic pain syndrome: Secondary | ICD-10-CM

## 2013-01-29 MED ORDER — TRAMADOL HCL 50 MG PO TABS: ORAL_TABLET | ORAL | Status: DC

## 2013-01-29 NOTE — Telephone Encounter (Signed)
Patient was rx'd tramadol 180/3rf so this would be the correct amount.

## 2013-01-29 NOTE — Telephone Encounter (Signed)
Patient is upset the pharmacy is saying that in needs to say this that the one sent in is wrong

## 2013-01-29 NOTE — Telephone Encounter (Signed)
The tramadol rx was changed to 1-2 po q6hours prn #180/3rf

## 2013-02-15 ENCOUNTER — Other Ambulatory Visit: Payer: Self-pay | Admitting: Nurse Practitioner

## 2013-03-01 ENCOUNTER — Ambulatory Visit: Payer: Self-pay | Admitting: Nurse Practitioner

## 2013-03-11 ENCOUNTER — Other Ambulatory Visit: Payer: Self-pay | Admitting: Nurse Practitioner

## 2013-03-14 ENCOUNTER — Other Ambulatory Visit: Payer: Self-pay | Admitting: Nurse Practitioner

## 2013-04-15 ENCOUNTER — Other Ambulatory Visit: Payer: Self-pay

## 2013-04-15 DIAGNOSIS — E785 Hyperlipidemia, unspecified: Secondary | ICD-10-CM

## 2013-04-15 MED ORDER — MELOXICAM 15 MG PO TABS: 15.0000 mg | ORAL_TABLET | Freq: Every day | ORAL | Status: DC

## 2013-04-15 MED ORDER — FUROSEMIDE 20 MG PO TABS: 20.0000 mg | ORAL_TABLET | Freq: Every day | ORAL | Status: DC

## 2013-04-15 MED ORDER — SIMVASTATIN 20 MG PO TABS: 40.0000 mg | ORAL_TABLET | Freq: Every day | ORAL | Status: DC

## 2013-04-15 MED ORDER — LEVOTHYROXINE SODIUM 50 MCG PO TABS: 50.0000 ug | ORAL_TABLET | Freq: Every day | ORAL | Status: DC

## 2013-04-16 ENCOUNTER — Encounter (INDEPENDENT_AMBULATORY_CARE_PROVIDER_SITE_OTHER): Payer: Self-pay

## 2013-04-29 ENCOUNTER — Encounter: Payer: Self-pay | Admitting: Family Medicine

## 2013-04-29 ENCOUNTER — Encounter (INDEPENDENT_AMBULATORY_CARE_PROVIDER_SITE_OTHER): Payer: Self-pay

## 2013-04-29 ENCOUNTER — Ambulatory Visit (INDEPENDENT_AMBULATORY_CARE_PROVIDER_SITE_OTHER): Payer: BC Managed Care – PPO | Admitting: Family Medicine

## 2013-04-29 VITALS — BP 124/74 | HR 84 | Temp 98.3°F

## 2013-04-29 DIAGNOSIS — I10 Essential (primary) hypertension: Secondary | ICD-10-CM | POA: Diagnosis not present

## 2013-04-29 DIAGNOSIS — E1165 Type 2 diabetes mellitus with hyperglycemia: Secondary | ICD-10-CM

## 2013-04-29 DIAGNOSIS — E785 Hyperlipidemia, unspecified: Secondary | ICD-10-CM

## 2013-04-29 LAB — POCT GLYCOSYLATED HEMOGLOBIN (HGB A1C): Hemoglobin A1C: 7.8

## 2013-04-29 NOTE — Patient Instructions (Signed)
Calorie Counting Diet A calorie counting diet requires you to eat the number of calories that are right for you in a day. Calories are the measurement of how much energy you get from the food you eat. Eating the right amount of calories is important for staying at a healthy weight. If you eat too many calories, your body will store them as fat and you may gain weight. If you eat too few calories, you may lose weight. Counting the number of calories you eat during a day will help you know if you are eating the right amount. A Registered Dietitian can determine how many calories you need in a day. The amount of calories needed varies from person to person. If your goal is to lose weight, you will need to eat fewer calories. Losing weight can benefit you if you are overweight or have health problems such as heart disease, high blood pressure, or diabetes. If your goal is to gain weight, you will need to eat more calories. Gaining weight may be necessary if you have a certain health problem that causes your body to need more energy. TIPS Whether you are increasing or decreasing the number of calories you eat during a day, it may be hard to get used to changes in what you eat and drink. The following are tips to help you keep track of the number of calories you eat.  Measure foods at home with measuring cups. This helps you know the amount of food and number of calories you are eating.  Restaurants often serve food in amounts that are larger than 1 serving. While eating out, estimate how many servings of a food you are given. For example, a serving of cooked rice is  cup or about the size of half of a fist. Knowing serving sizes will help you be aware of how much food you are eating at restaurants.  Ask for smaller portion sizes or child-size portions at restaurants.  Plan to eat half of a meal at a restaurant. Take the rest home or share the other half with a friend.  Read the Nutrition Facts panel on  food labels for calorie content and serving size. You can find out how many servings are in a package, the size of a serving, and the number of calories each serving has.  For example, a package might contain 3 cookies. The Nutrition Facts panel on that package says that 1 serving is 1 cookie. Below that, it will say there are 3 servings in the container. The calories section of the Nutrition Facts label says there are 90 calories. This means there are 90 calories in 1 cookie (1 serving). If you eat 1 cookie you have eaten 90 calories. If you eat all 3 cookies, you have eaten 270 calories (3 servings x 90 calories = 270 calories). The list below tells you how big or small some common portion sizes are.  1 oz.........4 stacked dice.  3 oz.........Deck of cards.  1 tsp........Tip of little finger.  1 tbs........Thumb.  2 tbs........Golf ball.   cup.......Half of a fist.  1 cup........A fist. KEEP A FOOD LOG Write down every food item you eat, the amount you eat, and the number of calories in each food you eat during the day. At the end of the day, you can add up the total number of calories you have eaten. It may help to keep a list like the one below. Find out the calorie information by reading the   Nutrition Facts panel on food labels. Breakfast  Bran cereal (1 cup, 110 calories).  Fat-free milk ( cup, 45 calories). Snack  Apple (1 medium, 80 calories). Lunch  Spinach (1 cup, 20 calories).  Tomato ( medium, 20 calories).  Chicken breast strips (3 oz, 165 calories).  Shredded cheddar cheese ( cup, 110 calories).  Light Italian dressing (2 tbs, 60 calories).  Whole-wheat bread (1 slice, 80 calories).  Tub margarine (1 tsp, 35 calories).  Vegetable soup (1 cup, 160 calories). Dinner  Pork chop (3 oz, 190 calories).  Brown rice (1 cup, 215 calories).  Steamed broccoli ( cup, 20 calories).  Strawberries (1  cup, 65 calories).  Whipped cream (1 tbs, 50  calories). Daily Calorie Total: 1425 Document Released: 07/04/2005 Document Revised: 09/26/2011 Document Reviewed: 12/29/2006 ExitCare Patient Information 2014 [employer].  

## 2013-04-29 NOTE — Progress Notes (Signed)
  Subjective:    Patient ID: Jennifer Rasmussen, female    DOB: 1950/09/23, 62 y.o.   MRN: [id number]  HPI  This 62 y.o. female presents for evaluation of follow up on diabetes.  She has hx Of diabetes and COPD.  She denies any recent exacerbation.  She does have severe Pain in her right hip and she sleeps in her recliner.  She has OSA and is not using CPAP. She has problems with morbid obesity and she has mobility problems.  Review of Systems C/o arthralgias.    No chest pain, SOB, HA, dizziness, vision change, N/V, diarrhea, constipation, dysuria, urinary urgency or frequency, or rash.  Objective:   Physical Exam  Vital signs noted  Well developed well nourished morbidly obese female.  HEENT - Head atraumatic Normocephalic                Eyes - PERRLA, Conjuctiva - clear Sclera- Clear EOMI                Ears - EAC's Wnl TM's Wnl Gross Hearing WNL                Nose - Nares patent                 Throat - oropharanx wnl Respiratory - Lungs CTA bilateral Cardiac - RRR S1 and S2 without murmur GI - Abdomen obese soft Nontender and bowel sounds active x 4 Extremities - No edema. Neuro - Grossly intact.  Results for orders placed in visit on 04/29/13  POCT GLYCOSYLATED HEMOGLOBIN (HGB A1C)      Result Value Range   Hemoglobin A1C 7.8        Assessment & Plan:  Essential hypertension, benign - Plan: CMP14+EGFR.  Her repeat bp with large cuff shows normal bp reading so continue current  Type II or unspecified type diabetes mellitus with unspecified complication, uncontrolled - Plan: POCT glycosylated hemoglobin (Hb A1C)  Hyperlipidemia - Plan: Lipid panel  Deatra Canter FNP

## 2013-04-30 ENCOUNTER — Other Ambulatory Visit: Payer: Self-pay | Admitting: Family Medicine

## 2013-04-30 DIAGNOSIS — E119 Type 2 diabetes mellitus without complications: Secondary | ICD-10-CM

## 2013-04-30 LAB — CMP14+EGFR
ALT: 9 IU/L (ref 0–32)
AST: 9 IU/L (ref 0–40)
Albumin/Globulin Ratio: 1.4 (ref 1.1–2.5)
Albumin: 4 g/dL (ref 3.6–4.8)
Alkaline Phosphatase: 146 IU/L — ABNORMAL HIGH (ref 39–117)
BUN/Creatinine Ratio: 20 (ref 11–26)
BUN: 15 mg/dL (ref 8–27)
CO2: 28 mmol/L (ref 18–29)
Calcium: 9.6 mg/dL (ref 8.6–10.2)
Chloride: 98 mmol/L (ref 97–108)
Creatinine, Ser: 0.75 mg/dL (ref 0.57–1.00)
GFR calc Af Amer: 98 mL/min/{1.73_m2} (ref >59)
GFR calc non Af Amer: 85 mL/min/{1.73_m2} (ref >59)
Globulin, Total: 2.8 g/dL (ref 1.5–4.5)
Glucose: 129 mg/dL — ABNORMAL HIGH (ref 65–99)
Potassium: 4.8 mmol/L (ref 3.5–5.2)
Sodium: 142 mmol/L (ref 134–144)
Total Bilirubin: 0.4 mg/dL (ref 0.0–1.2)
Total Protein: 6.8 g/dL (ref 6.0–8.5)

## 2013-04-30 LAB — LIPID PANEL
Chol/HDL Ratio: 2.9 ratio units (ref 0.0–4.4)
Cholesterol, Total: 153 mg/dL (ref 100–199)
HDL: 52 mg/dL (ref >39)
LDL Calculated: 84 mg/dL (ref 0–99)
Triglycerides: 86 mg/dL (ref 0–149)
VLDL Cholesterol Cal: 17 mg/dL (ref 5–40)

## 2013-04-30 MED ORDER — METFORMIN HCL 500 MG PO TABS: 500.0000 mg | ORAL_TABLET | Freq: Two times a day (BID) | ORAL | Status: DC

## 2013-05-08 ENCOUNTER — Other Ambulatory Visit: Payer: Self-pay | Admitting: Family Medicine

## 2013-05-08 DIAGNOSIS — E119 Type 2 diabetes mellitus without complications: Secondary | ICD-10-CM

## 2013-05-08 MED ORDER — SITAGLIPTIN PHOSPHATE 100 MG PO TABS: 100.0000 mg | ORAL_TABLET | Freq: Every day | ORAL | Status: DC

## 2013-06-06 ENCOUNTER — Other Ambulatory Visit: Payer: Self-pay | Admitting: Family Medicine

## 2013-06-07 ENCOUNTER — Telehealth: Payer: Self-pay

## 2013-06-07 NOTE — Telephone Encounter (Signed)
Pt aware to pick up rx tradamol . rx at front desk.

## 2013-06-07 NOTE — Telephone Encounter (Signed)
Patient last seen in office on 04-19-13. Rx last filled on 7-15 for #180 with 3 rfs. Please advise.  If approved please print and route to Pool A so nurse can call patient to pick up

## 2013-06-11 NOTE — Telephone Encounter (Signed)
CALLED INTO [facility]

## 2013-07-03 ENCOUNTER — Other Ambulatory Visit: Payer: Self-pay | Admitting: Family Medicine

## 2013-07-04 NOTE — Telephone Encounter (Signed)
Last seen 04/29/13  [facility] [place]  If approved print for mail order and route to nurse

## 2013-07-05 ENCOUNTER — Telehealth: Payer: Self-pay | Admitting: *Deleted

## 2013-07-05 NOTE — Telephone Encounter (Signed)
Pt notified RX for Tramadol ready for pt pick up Rx to front

## 2013-07-23 ENCOUNTER — Other Ambulatory Visit: Payer: Self-pay | Admitting: Family Medicine

## 2013-07-25 NOTE — Telephone Encounter (Signed)
Last seen and last glucose 04/29/13  Patient requesting a 90 day supply

## 2013-08-05 ENCOUNTER — Other Ambulatory Visit: Payer: Self-pay | Admitting: *Deleted

## 2013-08-05 ENCOUNTER — Other Ambulatory Visit: Payer: Self-pay | Admitting: Family Medicine

## 2013-08-05 DIAGNOSIS — E119 Type 2 diabetes mellitus without complications: Secondary | ICD-10-CM

## 2013-08-05 MED ORDER — SITAGLIPTIN PHOSPHATE 100 MG PO TABS: 100.0000 mg | ORAL_TABLET | Freq: Every day | ORAL | Status: DC

## 2013-08-05 NOTE — Telephone Encounter (Signed)
Patietn aware

## 2013-08-05 NOTE — Telephone Encounter (Signed)
Called to inform patient that Tramadol prescription is ready for pickup. She reported that Januvia is over $300 and would like to switch to something else. She has an appt mid February. Gave her enough samples to last until appt and a prescription savings card. She will discuss changing meds at the appt.

## 2013-08-21 ENCOUNTER — Other Ambulatory Visit: Payer: Self-pay | Admitting: *Deleted

## 2013-08-21 MED ORDER — BUDESONIDE-FORMOTEROL FUMARATE 160-4.5 MCG/ACT IN AERO: 2.0000 | INHALATION_SPRAY | Freq: Two times a day (BID) | RESPIRATORY_TRACT | Status: DC

## 2013-08-30 ENCOUNTER — Ambulatory Visit (INDEPENDENT_AMBULATORY_CARE_PROVIDER_SITE_OTHER): Payer: BC Managed Care – PPO | Admitting: Family Medicine

## 2013-08-30 ENCOUNTER — Encounter: Payer: Self-pay | Admitting: Family Medicine

## 2013-08-30 VITALS — BP 124/74 | HR 84 | Temp 97.8°F | Ht 64.0 in | Wt 319.0 lb

## 2013-08-30 DIAGNOSIS — E119 Type 2 diabetes mellitus without complications: Secondary | ICD-10-CM | POA: Diagnosis not present

## 2013-08-30 MED ORDER — GLIMEPIRIDE 4 MG PO TABS: 8.0000 mg | ORAL_TABLET | Freq: Every day | ORAL | Status: DC

## 2013-08-30 MED ORDER — CARVEDILOL 12.5 MG PO TABS: ORAL_TABLET | ORAL | Status: DC

## 2013-08-30 NOTE — Progress Notes (Signed)
   Subjective:    Patient ID: Jennifer Rasmussen, female    DOB: 1950/10/27, 63 y.o.   MRN: [id number]  HPI  This 63 y.o. female presents for evaluation of diabetes.  She cannot afford her [place] and  Wants another medicine.  She has hx of hypertension.  She needs refills on her coreg.  Review of Systems    No chest pain, SOB, HA, dizziness, vision change, N/V, diarrhea, constipation, dysuria, urinary urgency or frequency, myalgias, arthralgias or rash.  Objective:   Physical Exam  Vital signs noted  Well developed well nourished female.  HEENT - Head atraumatic Normocephalic                Eyes - PERRLA, Conjuctiva - clear Sclera- Clear EOMI                Ears - EAC's Wnl TM's Wnl Gross Hearing WN                Throat - oropharanx wnl Respiratory - Lungs CTA bilateral Cardiac - RRR S1 and S2 without murmur GI - Abdomen soft Nontender and bowel sounds active x 4       Assessment & Plan:  Diabetes - Plan: carvedilol (COREG) 12.5 MG tablet, glimepiride (AMARYL) 4 MG tablet DC januvia and follow up in 3-4 months  Deatra CanterWilliam J Marja Adderley FNP

## 2013-09-12 ENCOUNTER — Other Ambulatory Visit: Payer: Self-pay | Admitting: Family Medicine

## 2013-09-23 ENCOUNTER — Other Ambulatory Visit: Payer: Self-pay

## 2013-09-23 NOTE — Telephone Encounter (Signed)
[facility] 08[facility]30/13  [facility]  Last thyroid 10/24/12  This is mail order for 90 day supply

## 2013-09-24 MED ORDER — LEVOTHYROXINE SODIUM 50 MCG PO TABS: 50.0000 ug | ORAL_TABLET | Freq: Every day | ORAL | Status: DC

## 2013-09-25 ENCOUNTER — Other Ambulatory Visit: Payer: Self-pay | Admitting: Family Medicine

## 2013-10-17 ENCOUNTER — Telehealth: Payer: Self-pay | Admitting: Family Medicine

## 2013-10-21 ENCOUNTER — Other Ambulatory Visit: Payer: Self-pay | Admitting: Family Medicine

## 2013-10-22 ENCOUNTER — Other Ambulatory Visit: Payer: Self-pay | Admitting: Family Medicine

## 2013-10-22 NOTE — Telephone Encounter (Signed)
Joyce GrossKay do you know what she needs?

## 2013-11-21 ENCOUNTER — Other Ambulatory Visit: Payer: Self-pay | Admitting: Family Medicine

## 2013-11-22 NOTE — Telephone Encounter (Signed)
Last seen 08/30/13  B Oxford  Last glucose 04/29/13

## 2013-12-02 ENCOUNTER — Other Ambulatory Visit: Payer: Self-pay | Admitting: Family Medicine

## 2013-12-04 ENCOUNTER — Ambulatory Visit (INDEPENDENT_AMBULATORY_CARE_PROVIDER_SITE_OTHER): Payer: BC Managed Care – PPO | Admitting: Family Medicine

## 2013-12-04 ENCOUNTER — Other Ambulatory Visit: Payer: Self-pay | Admitting: *Deleted

## 2013-12-04 ENCOUNTER — Encounter: Payer: Self-pay | Admitting: Family Medicine

## 2013-12-04 VITALS — BP 114/68 | HR 81 | Temp 98.4°F | Wt 314.4 lb

## 2013-12-04 DIAGNOSIS — I10 Essential (primary) hypertension: Secondary | ICD-10-CM

## 2013-12-04 DIAGNOSIS — E119 Type 2 diabetes mellitus without complications: Secondary | ICD-10-CM

## 2013-12-04 DIAGNOSIS — R5383 Other fatigue: Secondary | ICD-10-CM

## 2013-12-04 DIAGNOSIS — E039 Hypothyroidism, unspecified: Secondary | ICD-10-CM | POA: Diagnosis not present

## 2013-12-04 DIAGNOSIS — R5381 Other malaise: Secondary | ICD-10-CM | POA: Diagnosis not present

## 2013-12-04 DIAGNOSIS — E785 Hyperlipidemia, unspecified: Secondary | ICD-10-CM | POA: Diagnosis not present

## 2013-12-04 LAB — POCT CBC
Granulocyte percent: 73 %G (ref 37–80)
HCT, POC: 41.2 % (ref 37.7–47.9)
Hemoglobin: 13 g/dL (ref 12.2–16.2)
Lymph, poc: 2.1 (ref 0.6–3.4)
MCH, POC: 24.8 pg — AB (ref 27–31.2)
MCHC: 31.5 g/dL — AB (ref 31.8–35.4)
MCV: 78.9 fL — AB (ref 80–97)
MPV: 8.9 fL (ref 0–99.8)
POC Granulocyte: 6.6 (ref 2–6.9)
POC LYMPH PERCENT: 22.8 %L (ref 10–50)
Platelet Count, POC: 271 10*3/uL (ref 142–424)
RBC: 5.2 M/uL (ref 4.04–5.48)
RDW, POC: 15.2 %
WBC: 9.1 10*3/uL (ref 4.6–10.2)

## 2013-12-04 LAB — POCT GLYCOSYLATED HEMOGLOBIN (HGB A1C): Hemoglobin A1C: 7.8

## 2013-12-04 MED ORDER — TRAMADOL HCL 50 MG PO TABS: 50.0000 mg | ORAL_TABLET | Freq: Four times a day (QID) | ORAL | Status: DC | PRN

## 2013-12-04 MED ORDER — TRAMADOL HCL 50 MG PO TABS: 100.0000 mg | ORAL_TABLET | Freq: Four times a day (QID) | ORAL | Status: DC | PRN

## 2013-12-04 MED ORDER — LEVOTHYROXINE SODIUM 50 MCG PO TABS: 50.0000 ug | ORAL_TABLET | Freq: Every day | ORAL | Status: DC

## 2013-12-04 NOTE — Progress Notes (Signed)
   Subjective:    Patient ID: Jennifer Rasmussen, female    DOB: [birth year], 63 y.o.   MRN: [id number]  HPI This 63 y.o. female presents for evaluation of routine follow up.  She has hx of hypertension, DM, hypothyroidism, and hyperlipidemia.   Review of Systems    No chest pain, SOB, HA, dizziness, vision change, N/V, diarrhea, constipation, dysuria, urinary urgency or frequency, myalgias, arthralgias or rash.  Objective:   Physical Exam  Vital signs noted  Well developed well nourished obese female.  HEENT - Head atraumatic Normocephalic                Eyes - PERRLA, Conjuctiva - clear Sclera- Clear EOMI                Ears - EAC's Wnl TM's Wnl Gross Hearing WNL                Nose - Nares patent                 Throat - oropharanx wnl Respiratory - Lungs CTA bilateral Cardiac - RRR S1 and S2 without murmur GI - Abdomen soft Nontender and bowel sounds active x 4 Extremities - No edema. Neuro - Grossly intact.      Assessment & Plan:  Hyperlipemia - Plan: Lipid panel, CMP14+EGFR  Hypertension - Plan: CMP14+EGFR  Hypothyroid - Plan: levothyroxine (SYNTHROID, LEVOTHROID) 50 MCG tablet, Thyroid Panel With TSH  Fatigue - Plan: POCT CBC, Thyroid Panel With TSH, Vit D  25 hydroxy (rtn osteoporosis monitoring)  Diabetes mellitus, type 2 - Plan: POCT glycosylated hemoglobin (Hb A1C), Lipid panel, CMP14+EGFR  Follow up in 3 months  Lysbeth Penner FNP

## 2013-12-05 LAB — CMP14+EGFR
ALT: 8 IU/L (ref 0–32)
AST: 12 IU/L (ref 0–40)
Albumin/Globulin Ratio: 1.3 (ref 1.1–2.5)
Albumin: 4 g/dL (ref 3.6–4.8)
Alkaline Phosphatase: 154 IU/L — ABNORMAL HIGH (ref 39–117)
BUN/Creatinine Ratio: 15 (ref 11–26)
BUN: 12 mg/dL (ref 8–27)
CO2: 24 mmol/L (ref 18–29)
Calcium: 9.3 mg/dL (ref 8.7–10.3)
Chloride: 96 mmol/L — ABNORMAL LOW (ref 97–108)
Creatinine, Ser: 0.82 mg/dL (ref 0.57–1.00)
GFR calc Af Amer: 87 mL/min/{1.73_m2} (ref >59)
GFR calc non Af Amer: 76 mL/min/{1.73_m2} (ref >59)
Globulin, Total: 3 g/dL (ref 1.5–4.5)
Glucose: 154 mg/dL — ABNORMAL HIGH (ref 65–99)
Potassium: 4.6 mmol/L (ref 3.5–5.2)
Sodium: 137 mmol/L (ref 134–144)
Total Bilirubin: 0.3 mg/dL (ref 0.0–1.2)
Total Protein: 7 g/dL (ref 6.0–8.5)

## 2013-12-05 LAB — THYROID PANEL WITH TSH
Free Thyroxine Index: 2.6 (ref 1.2–4.9)
T3 Uptake Ratio: 25 % (ref 24–39)
T4, Total: 10.2 ug/dL (ref 4.5–12.0)
TSH: 2.65 u[IU]/mL (ref 0.450–4.500)

## 2013-12-05 LAB — LIPID PANEL
Chol/HDL Ratio: 3.2 ratio units (ref 0.0–4.4)
Cholesterol, Total: 161 mg/dL (ref 100–199)
HDL: 51 mg/dL (ref >39)
LDL Calculated: 85 mg/dL (ref 0–99)
Triglycerides: 126 mg/dL (ref 0–149)
VLDL Cholesterol Cal: 25 mg/dL (ref 5–40)

## 2013-12-05 LAB — VITAMIN D 25 HYDROXY (VIT D DEFICIENCY, FRACTURES): Vit D, 25-Hydroxy: 27 ng/mL — ABNORMAL LOW (ref 30.0–100.0)

## 2013-12-10 ENCOUNTER — Other Ambulatory Visit: Payer: Self-pay | Admitting: Family Medicine

## 2013-12-10 MED ORDER — VITAMIN D (ERGOCALCIFEROL) 1.25 MG (50000 UNIT) PO CAPS: 50000.0000 [IU] | ORAL_CAPSULE | ORAL | Status: DC

## 2014-01-09 ENCOUNTER — Other Ambulatory Visit: Payer: Self-pay | Admitting: *Deleted

## 2014-01-09 MED ORDER — LEVOTHYROXINE SODIUM 50 MCG PO TABS: 50.0000 ug | ORAL_TABLET | Freq: Every day | ORAL | Status: DC

## 2014-01-20 ENCOUNTER — Other Ambulatory Visit: Payer: Self-pay | Admitting: Family Medicine

## 2014-01-25 ENCOUNTER — Other Ambulatory Visit: Payer: Self-pay | Admitting: Family Medicine

## 2014-03-11 ENCOUNTER — Encounter: Payer: Self-pay | Admitting: Family Medicine

## 2014-03-11 ENCOUNTER — Ambulatory Visit (INDEPENDENT_AMBULATORY_CARE_PROVIDER_SITE_OTHER): Payer: BC Managed Care – PPO | Admitting: Family Medicine

## 2014-03-11 VITALS — BP 141/84 | HR 90 | Temp 99.2°F | Ht 64.0 in | Wt 327.0 lb

## 2014-03-11 DIAGNOSIS — E119 Type 2 diabetes mellitus without complications: Secondary | ICD-10-CM

## 2014-03-11 DIAGNOSIS — M129 Arthropathy, unspecified: Secondary | ICD-10-CM | POA: Diagnosis not present

## 2014-03-11 DIAGNOSIS — I10 Essential (primary) hypertension: Secondary | ICD-10-CM

## 2014-03-11 DIAGNOSIS — E785 Hyperlipidemia, unspecified: Secondary | ICD-10-CM

## 2014-03-11 DIAGNOSIS — M1990 Unspecified osteoarthritis, unspecified site: Secondary | ICD-10-CM

## 2014-03-11 LAB — POCT CBC
Granulocyte percent: 80.6 %G — AB (ref 37–80)
HCT, POC: 40.2 % (ref 37.7–47.9)
Hemoglobin: 13.4 g/dL (ref 12.2–16.2)
Lymph, poc: 2.1 (ref 0.6–3.4)
MCH, POC: 26.5 pg — AB (ref 27–31.2)
MCHC: 33.4 g/dL (ref 31.8–35.4)
MCV: 79.5 fL — AB (ref 80–97)
MPV: 8.4 fL (ref 0–99.8)
POC Granulocyte: 9.3 — AB (ref 2–6.9)
POC LYMPH PERCENT: 18.4 %L (ref 10–50)
Platelet Count, POC: 243 10*3/uL (ref 142–424)
RBC: 5.1 M/uL (ref 4.04–5.48)
RDW, POC: 16.1 %
WBC: 11.6 10*3/uL — AB (ref 4.6–10.2)

## 2014-03-11 LAB — POCT GLYCOSYLATED HEMOGLOBIN (HGB A1C): Hemoglobin A1C: 7.8

## 2014-03-11 MED ORDER — TRAMADOL HCL 50 MG PO TABS: 100.0000 mg | ORAL_TABLET | Freq: Four times a day (QID) | ORAL | Status: DC | PRN

## 2014-03-11 MED ORDER — LISINOPRIL 10 MG PO TABS: 10.0000 mg | ORAL_TABLET | Freq: Every day | ORAL | Status: DC

## 2014-03-11 MED ORDER — SIMVASTATIN 20 MG PO TABS: 20.0000 mg | ORAL_TABLET | Freq: Every day | ORAL | Status: DC

## 2014-03-11 MED ORDER — MELOXICAM 15 MG PO TABS: 15.0000 mg | ORAL_TABLET | Freq: Every day | ORAL | Status: DC

## 2014-03-11 MED ORDER — CANAGLIFLOZIN 300 MG PO TABS: 300.0000 mg | ORAL_TABLET | ORAL | Status: DC

## 2014-03-11 NOTE — Progress Notes (Signed)
   Subjective:    Patient ID: Jennifer Rasmussen, female    DOB: 03/27/1951, 63 y.o.   MRN: [id number]  HPI  This 63 y.o. female presents for evaluation of htn, hyperlipidemia, DM, and obesity.  She has no acute complaints.  Her hgbaic is elevated at 7.8 and she cannot tolerate metformin.  Review of Systems    No chest pain, SOB, HA, dizziness, vision change, N/V, diarrhea, constipation, dysuria, urinary urgency or frequency, myalgias, arthralgias or rash.  Objective:   Physical Exam Vital signs noted  Well developed well nourished female.  HEENT - Head atraumatic Normocephalic                Eyes - PERRLA, Conjuctiva - clear Sclera- Clear EOMI                Ears - EAC's Wnl TM's Wnl Gross Hearing WNL                Nose - Nares patent                 Throat - oropharanx wnl Respiratory - Lungs CTA bilateral Cardiac - RRR S1 and S2 without murmur GI - Abdomen soft Nontender and bowel sounds active x 4 Extremities - No edema. Neuro - Grossly intact.   Results for orders placed in visit on 03/11/14  POCT CBC      Result Value Ref Range   WBC 11.6 (*) 4.6 - 10.2 K/uL   Lymph, poc 2.1  0.6 - 3.4   POC LYMPH PERCENT 18.4  10 - 50 %L   POC Granulocyte 9.3 (*) 2 - 6.9   Granulocyte percent 80.6 (*) 37 - 80 %G   RBC 5.1  4.04 - 5.48 M/uL   Hemoglobin 13.4  12.2 - 16.2 g/dL   HCT, POC 40.2  37.7 - 47.9 %   MCV 79.5 (*) 80 - 97 fL   MCH, POC 26.5 (*) 27 - 31.2 pg   MCHC 33.4  31.8 - 35.4 g/dL   RDW, POC 16.1     Platelet Count, POC 243.0  142 - 424 K/uL   MPV 8.4  0 - 99.8 fL  POCT GLYCOSYLATED HEMOGLOBIN (HGB A1C)      Result Value Ref Range   Hemoglobin A1C 7.8        Assessment & Plan:  Type 2 diabetes mellitus without complication - Plan: POCT glycosylated hemoglobin (Hb A1C), CMP14+EGFR, Canagliflozin (INVOKANA) 300 MG TABS. Add invokana since diabetes not controlled and follow up in 3 months.  Discussed if fsbs drops down would consider reducing amaryl  first.  Hyperlipemia - Plan: Lipid panel, simvastatin (ZOCOR) 20 MG tablet  Essential hypertension, benign - Plan: POCT CBC, lisinopril (PRINIVIL,ZESTRIL) 10 MG tablet  Arthritis - Plan: traMADol (ULTRAM) 50 MG tablet, meloxicam (MOBIC) 15 MG tablet  Lysbeth Penner FNP

## 2014-03-12 LAB — CMP14+EGFR
ALT: 11 IU/L (ref 0–32)
AST: 7 IU/L (ref 0–40)
Albumin/Globulin Ratio: 1.2 (ref 1.1–2.5)
Albumin: 4 g/dL (ref 3.6–4.8)
Alkaline Phosphatase: 149 IU/L — ABNORMAL HIGH (ref 39–117)
BUN/Creatinine Ratio: 18 (ref 11–26)
BUN: 15 mg/dL (ref 8–27)
CO2: 27 mmol/L (ref 18–29)
Calcium: 9.8 mg/dL (ref 8.7–10.3)
Chloride: 95 mmol/L — ABNORMAL LOW (ref 97–108)
Creatinine, Ser: 0.84 mg/dL (ref 0.57–1.00)
GFR calc Af Amer: 85 mL/min/{1.73_m2} (ref >59)
GFR calc non Af Amer: 74 mL/min/{1.73_m2} (ref >59)
Globulin, Total: 3.3 g/dL (ref 1.5–4.5)
Glucose: 148 mg/dL — ABNORMAL HIGH (ref 65–99)
Potassium: 4.8 mmol/L (ref 3.5–5.2)
Sodium: 140 mmol/L (ref 134–144)
Total Bilirubin: 0.4 mg/dL (ref 0.0–1.2)
Total Protein: 7.3 g/dL (ref 6.0–8.5)

## 2014-03-12 LAB — LIPID PANEL
Chol/HDL Ratio: 2.9 ratio units (ref 0.0–4.4)
Cholesterol, Total: 168 mg/dL (ref 100–199)
HDL: 57 mg/dL (ref >39)
LDL Calculated: 89 mg/dL (ref 0–99)
Triglycerides: 112 mg/dL (ref 0–149)
VLDL Cholesterol Cal: 22 mg/dL (ref 5–40)

## 2014-06-18 ENCOUNTER — Telehealth: Payer: Self-pay | Admitting: Family Medicine

## 2014-06-18 ENCOUNTER — Ambulatory Visit (INDEPENDENT_AMBULATORY_CARE_PROVIDER_SITE_OTHER): Payer: BC Managed Care – PPO | Admitting: Family Medicine

## 2014-06-18 ENCOUNTER — Encounter: Payer: Self-pay | Admitting: Family Medicine

## 2014-06-18 ENCOUNTER — Other Ambulatory Visit: Payer: Self-pay | Admitting: Family Medicine

## 2014-06-18 ENCOUNTER — Ambulatory Visit (INDEPENDENT_AMBULATORY_CARE_PROVIDER_SITE_OTHER): Payer: BC Managed Care – PPO

## 2014-06-18 VITALS — BP 147/66 | HR 91 | Temp 98.0°F | Ht 63.0 in | Wt 317.0 lb

## 2014-06-18 DIAGNOSIS — L03119 Cellulitis of unspecified part of limb: Secondary | ICD-10-CM

## 2014-06-18 DIAGNOSIS — L02419 Cutaneous abscess of limb, unspecified: Secondary | ICD-10-CM | POA: Diagnosis not present

## 2014-06-18 DIAGNOSIS — L0291 Cutaneous abscess, unspecified: Secondary | ICD-10-CM

## 2014-06-18 DIAGNOSIS — M1990 Unspecified osteoarthritis, unspecified site: Secondary | ICD-10-CM

## 2014-06-18 DIAGNOSIS — E1122 Type 2 diabetes mellitus with diabetic chronic kidney disease: Secondary | ICD-10-CM

## 2014-06-18 DIAGNOSIS — N189 Chronic kidney disease, unspecified: Secondary | ICD-10-CM

## 2014-06-18 LAB — POCT CBC
Granulocyte percent: 72 %G (ref 37–80)
HCT, POC: 41.6 % (ref 37.7–47.9)
Hemoglobin: 13.1 g/dL (ref 12.2–16.2)
Lymph, poc: 2.3 (ref 0.6–3.4)
MCH, POC: 25.6 pg — AB (ref 27–31.2)
MCHC: 31.5 g/dL — AB (ref 31.8–35.4)
MCV: 81.3 fL (ref 80–97)
MPV: 8.1 fL (ref 0–99.8)
POC Granulocyte: 7.6 — AB (ref 2–6.9)
POC LYMPH PERCENT: 22 %L (ref 10–50)
Platelet Count, POC: 273 10*3/uL (ref 142–424)
RBC: 5.1 M/uL (ref 4.04–5.48)
RDW, POC: 15.1 %
WBC: 10.6 10*3/uL — AB (ref 4.6–10.2)

## 2014-06-18 LAB — POCT GLYCOSYLATED HEMOGLOBIN (HGB A1C): Hemoglobin A1C: 8

## 2014-06-18 LAB — POCT UA - MICROALBUMIN: Microalbumin Ur, POC: 100 mg/L

## 2014-06-18 MED ORDER — MELOXICAM 15 MG PO TABS: 15.0000 mg | ORAL_TABLET | Freq: Every day | ORAL | Status: DC

## 2014-06-18 MED ORDER — CEFTRIAXONE SODIUM 1 G IJ SOLR
1.0000 g | Freq: Once | INTRAMUSCULAR | Status: AC
  Administered 2014-06-18: 1 g via INTRAMUSCULAR

## 2014-06-18 MED ORDER — TRAMADOL HCL 50 MG PO TABS: 100.0000 mg | ORAL_TABLET | Freq: Four times a day (QID) | ORAL | Status: DC | PRN

## 2014-06-18 MED ORDER — METFORMIN HCL 500 MG PO TABS: 500.0000 mg | ORAL_TABLET | Freq: Two times a day (BID) | ORAL | Status: DC

## 2014-06-18 NOTE — Progress Notes (Signed)
Subjective:    Patient ID: Jennifer Rasmussen, female    DOB: 12/18/1950, 63 y.o.   MRN: [id number]  HPI Patient is here for diabetes follow up.  She has hx of venous stasis dermatitis.  She is having a wound on her right leg that is draining and is getting worse.  She has pain in her right knee and right leg.    Review of Systems  Constitutional: Positive for fatigue.  Skin: Positive for rash and wound.  All other systems reviewed and are negative.      Objective:    BP 147/66 mmHg  Pulse 91  Temp(Src) 98 F (36.7 C) (Oral)  Ht 5' 3" (1.6 m)  Wt 317 lb (143.79 kg)  BMI 56.17 kg/m2  LMP 10/24/2001 Physical Exam  Constitutional:  Chronically ill appearing female  Eyes: Conjunctivae are normal. Pupils are equal, round, and reactive to light.  Neck: Normal range of motion. Neck supple.  Cardiovascular: Normal rate and regular rhythm.   Pulmonary/Chest: Effort normal and breath sounds normal.  Skin:  Right lower extremity with cellulitis swelling and drainage          Assessment & Plan:     ICD-9-CM ICD-10-CM   1. Arthritis 716.90 M19.90 meloxicam (MOBIC) 15 MG tablet     traMADol (ULTRAM) 50 MG tablet  2. Abscess 682.9 L02.91 AMB referral to wound care center     cefTRIAXone (ROCEPHIN) injection 1 g  3. Type 2 diabetes mellitus with diabetic chronic kidney disease 250.40 E11.22 POCT CBC   585.9 N18.9 POCT glycosylated hemoglobin (Hb A1C)     CMP14+EGFR     Lipid panel     POCT UA - Microalbumin  4. Cellulitis and abscess of leg 682.6 L02.419 DG Knee 1-2 Views Right    L03.119      No Follow-up on file.  William J Oxford FNP  

## 2014-06-18 NOTE — Telephone Encounter (Signed)
Stp she states today at visit she spoke with Annette StableBill regarding starting the Metformin again as it only caused upset stomach and she's not truly allergic to it. She says the Invokana she can't take. I didn't see in chart where he put her back on Metformin. Please advise.

## 2014-06-18 NOTE — Telephone Encounter (Signed)
Metformin sent to pharmacy

## 2014-06-19 DIAGNOSIS — M1990 Unspecified osteoarthritis, unspecified site: Secondary | ICD-10-CM | POA: Diagnosis not present

## 2014-06-19 DIAGNOSIS — Z833 Family history of diabetes mellitus: Secondary | ICD-10-CM | POA: Diagnosis not present

## 2014-06-19 DIAGNOSIS — I872 Venous insufficiency (chronic) (peripheral): Secondary | ICD-10-CM | POA: Diagnosis not present

## 2014-06-19 DIAGNOSIS — I890 Lymphedema, not elsewhere classified: Secondary | ICD-10-CM | POA: Diagnosis not present

## 2014-06-19 DIAGNOSIS — E119 Type 2 diabetes mellitus without complications: Secondary | ICD-10-CM | POA: Diagnosis not present

## 2014-06-19 DIAGNOSIS — F17210 Nicotine dependence, cigarettes, uncomplicated: Secondary | ICD-10-CM | POA: Diagnosis not present

## 2014-06-19 DIAGNOSIS — L97812 Non-pressure chronic ulcer of other part of right lower leg with fat layer exposed: Secondary | ICD-10-CM | POA: Diagnosis not present

## 2014-06-19 DIAGNOSIS — E669 Obesity, unspecified: Secondary | ICD-10-CM | POA: Diagnosis not present

## 2014-06-19 DIAGNOSIS — Z79899 Other long term (current) drug therapy: Secondary | ICD-10-CM | POA: Diagnosis not present

## 2014-06-19 DIAGNOSIS — I83018 Varicose veins of right lower extremity with ulcer other part of lower leg: Secondary | ICD-10-CM | POA: Diagnosis not present

## 2014-06-19 DIAGNOSIS — L97819 Non-pressure chronic ulcer of other part of right lower leg with unspecified severity: Secondary | ICD-10-CM | POA: Diagnosis not present

## 2014-06-19 DIAGNOSIS — Z7951 Long term (current) use of inhaled steroids: Secondary | ICD-10-CM | POA: Diagnosis not present

## 2014-06-19 LAB — LIPID PANEL
Chol/HDL Ratio: 3.4 ratio units (ref 0.0–4.4)
Cholesterol, Total: 164 mg/dL (ref 100–199)
HDL: 48 mg/dL (ref >39)
LDL Calculated: 87 mg/dL (ref 0–99)
Triglycerides: 145 mg/dL (ref 0–149)
VLDL Cholesterol Cal: 29 mg/dL (ref 5–40)

## 2014-06-19 LAB — CMP14+EGFR
ALT: 9 IU/L (ref 0–32)
AST: 11 IU/L (ref 0–40)
Albumin/Globulin Ratio: 1.2 (ref 1.1–2.5)
Albumin: 3.7 g/dL (ref 3.6–4.8)
Alkaline Phosphatase: 161 IU/L — ABNORMAL HIGH (ref 39–117)
BUN/Creatinine Ratio: 15 (ref 11–26)
BUN: 14 mg/dL (ref 8–27)
CO2: 26 mmol/L (ref 18–29)
Calcium: 9.5 mg/dL (ref 8.7–10.3)
Chloride: 96 mmol/L — ABNORMAL LOW (ref 97–108)
Creatinine, Ser: 0.94 mg/dL (ref 0.57–1.00)
GFR calc Af Amer: 74 mL/min/{1.73_m2} (ref >59)
GFR calc non Af Amer: 64 mL/min/{1.73_m2} (ref >59)
Globulin, Total: 3.2 g/dL (ref 1.5–4.5)
Glucose: 147 mg/dL — ABNORMAL HIGH (ref 65–99)
Potassium: 4.8 mmol/L (ref 3.5–5.2)
Sodium: 141 mmol/L (ref 134–144)
Total Bilirubin: 0.4 mg/dL (ref 0.0–1.2)
Total Protein: 6.9 g/dL (ref 6.0–8.5)

## 2014-06-19 NOTE — Telephone Encounter (Signed)
Pt aware.

## 2014-06-26 DIAGNOSIS — M1990 Unspecified osteoarthritis, unspecified site: Secondary | ICD-10-CM | POA: Diagnosis not present

## 2014-06-26 DIAGNOSIS — Z79899 Other long term (current) drug therapy: Secondary | ICD-10-CM | POA: Diagnosis not present

## 2014-06-26 DIAGNOSIS — I83018 Varicose veins of right lower extremity with ulcer other part of lower leg: Secondary | ICD-10-CM | POA: Diagnosis not present

## 2014-06-26 DIAGNOSIS — I872 Venous insufficiency (chronic) (peripheral): Secondary | ICD-10-CM | POA: Diagnosis not present

## 2014-06-26 DIAGNOSIS — L97919 Non-pressure chronic ulcer of unspecified part of right lower leg with unspecified severity: Secondary | ICD-10-CM | POA: Diagnosis not present

## 2014-06-26 DIAGNOSIS — L97819 Non-pressure chronic ulcer of other part of right lower leg with unspecified severity: Secondary | ICD-10-CM | POA: Diagnosis not present

## 2014-06-26 DIAGNOSIS — L97812 Non-pressure chronic ulcer of other part of right lower leg with fat layer exposed: Secondary | ICD-10-CM | POA: Diagnosis not present

## 2014-06-26 DIAGNOSIS — L97519 Non-pressure chronic ulcer of other part of right foot with unspecified severity: Secondary | ICD-10-CM | POA: Diagnosis not present

## 2014-06-26 DIAGNOSIS — I890 Lymphedema, not elsewhere classified: Secondary | ICD-10-CM | POA: Diagnosis not present

## 2014-06-26 DIAGNOSIS — E119 Type 2 diabetes mellitus without complications: Secondary | ICD-10-CM | POA: Diagnosis not present

## 2014-06-27 ENCOUNTER — Telehealth: Payer: Self-pay | Admitting: Family Medicine

## 2014-06-27 NOTE — Telephone Encounter (Signed)
Labs printed and mailed,pt aware.

## 2014-07-01 ENCOUNTER — Telehealth: Payer: Self-pay | Admitting: Family Medicine

## 2014-07-01 NOTE — Telephone Encounter (Signed)
Jennifer Rasmussen at the Wound Care center needs some verification that we used wraps or uni-boot on this patient.  It  Wasn't mentioned in the office visit but patient says we did.  Phone number is [phone]

## 2014-07-01 NOTE — Telephone Encounter (Signed)
Jennifer Rasmussen can you call the wound center at your convenience

## 2014-07-03 DIAGNOSIS — E119 Type 2 diabetes mellitus without complications: Secondary | ICD-10-CM | POA: Diagnosis not present

## 2014-07-03 DIAGNOSIS — I872 Venous insufficiency (chronic) (peripheral): Secondary | ICD-10-CM | POA: Diagnosis not present

## 2014-07-03 DIAGNOSIS — M1990 Unspecified osteoarthritis, unspecified site: Secondary | ICD-10-CM | POA: Diagnosis not present

## 2014-07-03 DIAGNOSIS — I83018 Varicose veins of right lower extremity with ulcer other part of lower leg: Secondary | ICD-10-CM | POA: Diagnosis not present

## 2014-07-03 DIAGNOSIS — L97812 Non-pressure chronic ulcer of other part of right lower leg with fat layer exposed: Secondary | ICD-10-CM | POA: Diagnosis not present

## 2014-07-03 DIAGNOSIS — L97819 Non-pressure chronic ulcer of other part of right lower leg with unspecified severity: Secondary | ICD-10-CM | POA: Diagnosis not present

## 2014-07-03 DIAGNOSIS — Z79899 Other long term (current) drug therapy: Secondary | ICD-10-CM | POA: Diagnosis not present

## 2014-07-03 DIAGNOSIS — I890 Lymphedema, not elsewhere classified: Secondary | ICD-10-CM | POA: Diagnosis not present

## 2014-07-08 DIAGNOSIS — I890 Lymphedema, not elsewhere classified: Secondary | ICD-10-CM | POA: Diagnosis not present

## 2014-07-08 DIAGNOSIS — M1990 Unspecified osteoarthritis, unspecified site: Secondary | ICD-10-CM | POA: Diagnosis not present

## 2014-07-08 DIAGNOSIS — I872 Venous insufficiency (chronic) (peripheral): Secondary | ICD-10-CM | POA: Diagnosis not present

## 2014-07-08 DIAGNOSIS — L97819 Non-pressure chronic ulcer of other part of right lower leg with unspecified severity: Secondary | ICD-10-CM | POA: Diagnosis not present

## 2014-07-08 DIAGNOSIS — Z79899 Other long term (current) drug therapy: Secondary | ICD-10-CM | POA: Diagnosis not present

## 2014-07-08 DIAGNOSIS — E119 Type 2 diabetes mellitus without complications: Secondary | ICD-10-CM | POA: Diagnosis not present

## 2014-07-16 DIAGNOSIS — Z79899 Other long term (current) drug therapy: Secondary | ICD-10-CM | POA: Diagnosis not present

## 2014-07-16 DIAGNOSIS — I872 Venous insufficiency (chronic) (peripheral): Secondary | ICD-10-CM | POA: Diagnosis not present

## 2014-07-16 DIAGNOSIS — E119 Type 2 diabetes mellitus without complications: Secondary | ICD-10-CM | POA: Diagnosis not present

## 2014-07-16 DIAGNOSIS — I890 Lymphedema, not elsewhere classified: Secondary | ICD-10-CM | POA: Diagnosis not present

## 2014-07-16 DIAGNOSIS — L97819 Non-pressure chronic ulcer of other part of right lower leg with unspecified severity: Secondary | ICD-10-CM | POA: Diagnosis not present

## 2014-07-16 DIAGNOSIS — M1990 Unspecified osteoarthritis, unspecified site: Secondary | ICD-10-CM | POA: Diagnosis not present

## 2014-07-23 ENCOUNTER — Other Ambulatory Visit: Payer: Self-pay

## 2014-07-23 MED ORDER — GLUCOSE BLOOD VI STRP: ORAL_STRIP | Status: DC

## 2014-07-23 MED ORDER — GLIMEPIRIDE 4 MG PO TABS: 8.0000 mg | ORAL_TABLET | Freq: Every day | ORAL | Status: DC

## 2014-07-26 ENCOUNTER — Other Ambulatory Visit: Payer: Self-pay | Admitting: Family Medicine

## 2014-08-04 ENCOUNTER — Other Ambulatory Visit: Payer: Self-pay | Admitting: Family Medicine

## 2014-08-06 ENCOUNTER — Other Ambulatory Visit: Payer: Self-pay | Admitting: *Deleted

## 2014-08-06 MED ORDER — METFORMIN HCL 500 MG PO TABS: 500.0000 mg | ORAL_TABLET | Freq: Two times a day (BID) | ORAL | Status: DC

## 2014-08-13 DIAGNOSIS — I872 Venous insufficiency (chronic) (peripheral): Secondary | ICD-10-CM | POA: Diagnosis not present

## 2014-08-13 DIAGNOSIS — I890 Lymphedema, not elsewhere classified: Secondary | ICD-10-CM | POA: Diagnosis not present

## 2014-08-13 DIAGNOSIS — I8390 Asymptomatic varicose veins of unspecified lower extremity: Secondary | ICD-10-CM | POA: Diagnosis not present

## 2014-08-13 DIAGNOSIS — M1990 Unspecified osteoarthritis, unspecified site: Secondary | ICD-10-CM | POA: Diagnosis not present

## 2014-08-19 ENCOUNTER — Other Ambulatory Visit: Payer: Self-pay | Admitting: Family Medicine

## 2014-09-10 DIAGNOSIS — Z872 Personal history of diseases of the skin and subcutaneous tissue: Secondary | ICD-10-CM | POA: Diagnosis not present

## 2014-09-10 DIAGNOSIS — I83018 Varicose veins of right lower extremity with ulcer other part of lower leg: Secondary | ICD-10-CM | POA: Diagnosis not present

## 2014-09-10 DIAGNOSIS — L97812 Non-pressure chronic ulcer of other part of right lower leg with fat layer exposed: Secondary | ICD-10-CM | POA: Diagnosis not present

## 2014-09-10 DIAGNOSIS — Z09 Encounter for follow-up examination after completed treatment for conditions other than malignant neoplasm: Secondary | ICD-10-CM | POA: Diagnosis not present

## 2014-09-22 ENCOUNTER — Ambulatory Visit (INDEPENDENT_AMBULATORY_CARE_PROVIDER_SITE_OTHER): Payer: BLUE CROSS/BLUE SHIELD | Admitting: Family

## 2014-09-22 ENCOUNTER — Encounter: Payer: Self-pay | Admitting: Family

## 2014-09-22 VITALS — BP 126/78 | HR 73 | Temp 97.4°F | Ht 63.0 in | Wt 305.4 lb

## 2014-09-22 DIAGNOSIS — I10 Essential (primary) hypertension: Secondary | ICD-10-CM

## 2014-09-22 DIAGNOSIS — E039 Hypothyroidism, unspecified: Secondary | ICD-10-CM

## 2014-09-22 DIAGNOSIS — M1990 Unspecified osteoarthritis, unspecified site: Secondary | ICD-10-CM | POA: Diagnosis not present

## 2014-09-22 DIAGNOSIS — E785 Hyperlipidemia, unspecified: Secondary | ICD-10-CM | POA: Diagnosis not present

## 2014-09-22 DIAGNOSIS — E1165 Type 2 diabetes mellitus with hyperglycemia: Secondary | ICD-10-CM | POA: Diagnosis not present

## 2014-09-22 DIAGNOSIS — K219 Gastro-esophageal reflux disease without esophagitis: Secondary | ICD-10-CM | POA: Insufficient documentation

## 2014-09-22 DIAGNOSIS — J441 Chronic obstructive pulmonary disease with (acute) exacerbation: Secondary | ICD-10-CM | POA: Diagnosis not present

## 2014-09-22 DIAGNOSIS — E1169 Type 2 diabetes mellitus with other specified complication: Secondary | ICD-10-CM | POA: Insufficient documentation

## 2014-09-22 DIAGNOSIS — E559 Vitamin D deficiency, unspecified: Secondary | ICD-10-CM | POA: Insufficient documentation

## 2014-09-22 DIAGNOSIS — R609 Edema, unspecified: Secondary | ICD-10-CM

## 2014-09-22 LAB — POCT GLYCOSYLATED HEMOGLOBIN (HGB A1C): HEMOGLOBIN A1C: 7

## 2014-09-22 MED ORDER — LISINOPRIL 10 MG PO TABS: 10.0000 mg | ORAL_TABLET | Freq: Every day | ORAL | Status: DC

## 2014-09-22 MED ORDER — FUROSEMIDE 20 MG PO TABS: 20.0000 mg | ORAL_TABLET | Freq: Every day | ORAL | Status: DC

## 2014-09-22 MED ORDER — TRAMADOL HCL 50 MG PO TABS: 100.0000 mg | ORAL_TABLET | Freq: Four times a day (QID) | ORAL | Status: DC | PRN

## 2014-09-22 MED ORDER — GLIMEPIRIDE 4 MG PO TABS: 8.0000 mg | ORAL_TABLET | Freq: Every day | ORAL | Status: DC

## 2014-09-22 MED ORDER — MELOXICAM 15 MG PO TABS: 15.0000 mg | ORAL_TABLET | Freq: Every day | ORAL | Status: DC

## 2014-09-22 NOTE — Progress Notes (Signed)
Subjective:    Patient ID: Jennifer Rasmussen, female    DOB: 12/28/1950, 63 y.o.   MRN: [id number]  Diabetes She presents for her follow-up diabetic visit. She has type 2 diabetes mellitus. Her disease course has been stable. Pertinent negatives for hypoglycemia include no confusion, dizziness, headaches, mood changes or nervousness/anxiousness. Associated symptoms include foot ulcerations (leg). Pertinent negatives for diabetes include no blurred vision, no chest pain, no foot paresthesias and no visual change. Pertinent negatives for hypoglycemia complications include no blackouts, no hospitalization and no nocturnal hypoglycemia. Symptoms are worsening. Diabetic complications include peripheral neuropathy. Pertinent negatives for diabetic complications include no CVA, heart disease, nephropathy or PVD. Risk factors for coronary artery disease include diabetes mellitus, dyslipidemia, hypertension, obesity, post-menopausal, family history, stress and tobacco exposure. Current diabetic treatment includes oral agent (dual therapy). She is compliant with treatment most of the time. She is following a generally unhealthy diet. Her breakfast blood glucose range is generally 110-130 mg/dl. An ACE inhibitor/angiotensin II receptor blocker is being taken. Eye exam is current.  Hyperlipidemia This is a chronic problem. The current episode started more than 1 year ago. The problem is controlled. Recent lipid tests were reviewed and are normal. Exacerbating diseases include diabetes and hypothyroidism. Factors aggravating her hyperlipidemia include smoking and fatty foods. Pertinent negatives include no chest pain, leg pain, myalgias or shortness of breath. Current antihyperlipidemic treatment includes statins. Risk factors for coronary artery disease include diabetes mellitus, dyslipidemia, hypertension, obesity, post-menopausal and family history.  Thyroid Problem Presents for follow-up visit. Symptoms include  constipation, diarrhea and hair loss. Patient reports no anxiety, depressed mood, dry skin, hoarse voice, leg swelling, nail problem, palpitations or visual change. The symptoms have been stable. Past treatments include levothyroxine. The treatment provided significant relief. Her past medical history is significant for diabetes and hyperlipidemia. There is no history of heart failure.  Hypertension This is a chronic problem. The current episode started more than 1 year ago. The problem has been resolved since onset. The problem is controlled. Associated symptoms include peripheral edema. Pertinent negatives include no blurred vision, chest pain, headaches, palpitations or shortness of breath. Risk factors for coronary artery disease include dyslipidemia, diabetes mellitus, family history, obesity, sedentary lifestyle and post-menopausal state. Past treatments include ACE inhibitors and beta blockers. The current treatment provides moderate improvement. Hypertensive end-organ damage includes a thyroid problem. There is no history of kidney disease, CAD/MI, CVA, heart failure or PVD.  Gastrophageal Reflux She reports no belching, no chest pain, no coughing, no globus sensation, no hoarse voice or no sore throat. This is a chronic problem. The current episode started more than 1 year ago. The problem occurs rarely. The problem has been resolved. The symptoms are aggravated by lying down and certain foods. Risk factors include obesity. She has tried a PPI for the symptoms.  COPD Pt currently taking symicort BID daily. Pt states this seems to help. Peripheral Edema Pt currently taking lasix 20 mg daily and is using "leg pumps" daily. Pt states this is the best her legs have "been in awhile"    Review of Systems  Constitutional: Negative.   HENT: Negative.  Negative for hoarse voice and sore throat.   Eyes: Negative.  Negative for blurred vision.  Respiratory: Negative.  Negative for cough and shortness  of breath.   Cardiovascular: Negative.  Negative for chest pain and palpitations.  Gastrointestinal: Positive for diarrhea and constipation.  Endocrine: Negative.   Genitourinary: Negative.   Musculoskeletal: Negative.  Negative for  myalgias.  Neurological: Negative.  Negative for dizziness and headaches.  Hematological: Negative.   Psychiatric/Behavioral: Negative.  Negative for confusion. The patient is not nervous/anxious.   All other systems reviewed and are negative.      Objective:   Physical Exam  Constitutional: She is oriented to person, place, and time. She appears well-developed and well-nourished. No distress.  HENT:  Head: Normocephalic and atraumatic.  Right Ear: External ear normal.  Left Ear: External ear normal.  Nose: Nose normal.  Mouth/Throat: Oropharynx is clear and moist.  Eyes: Pupils are equal, round, and reactive to light.  Neck: Normal range of motion. Neck supple. No thyromegaly present.  Cardiovascular: Normal rate, regular rhythm, normal heart sounds and intact distal pulses.   No murmur heard. Pulmonary/Chest: Effort normal and breath sounds normal. No respiratory distress. She has no wheezes.  Abdominal: Soft. Bowel sounds are normal. She exhibits no distension. There is no tenderness.  Musculoskeletal: Normal range of motion. She exhibits no edema or tenderness.  Neurological: She is alert and oriented to person, place, and time. She has normal reflexes. No cranial nerve deficit.  Skin: Skin is warm and dry.  Psychiatric: She has a normal mood and affect. Her behavior is normal. Judgment and thought content normal.  Vitals reviewed.   BP 126/78 mmHg  Pulse 73  Temp(Src) 97.4 F (36.3 C) (Oral)  Ht $R'5\' 3"'af$  (1.6 m)  Wt 305 lb 6.4 oz (138.529 kg)  BMI 54.11 kg/m2  LMP 10/24/2001       Assessment & Plan:  1. Essential hypertension, benign - CMP14+EGFR - lisinopril (PRINIVIL,ZESTRIL) 10 MG tablet; Take 1 tablet (10 mg total) by mouth daily.   Dispense: 90 tablet; Refill: 3  2. COPD exacerbation - CMP14+EGFR  3. Hypothyroidism, unspecified hypothyroidism type - CMP14+EGFR - Thyroid Panel With TSH  4. Type 2 diabetes mellitus with hyperglycemia -Pt states that metformin is causing her stomach pains and diarrhea at times- She would like to go off of it or start something new- Her husband is on onglyza and would like to try that- Pt has been on Invokana and pt states she "peed too much and could  Not make it to the bathroom"  CMP14+EGFR - POCT glycosylated hemoglobin (Hb A1C) - glimepiride (AMARYL) 4 MG tablet; Take 2 tablets (8 mg total) by mouth daily before breakfast.  Dispense: 180 tablet; Refill: 4  5. Peripheral edema - CMP14+EGFR - furosemide (LASIX) 20 MG tablet; Take 1 tablet (20 mg total) by mouth daily.  Dispense: 90 tablet; Refill: 4  6. Morbid obesity - CMP14+EGFR  7. Arthritis - CMP14+EGFR - meloxicam (MOBIC) 15 MG tablet; Take 1 tablet (15 mg total) by mouth daily.  Dispense: 90 tablet; Refill: 3 - traMADol (ULTRAM) 50 MG tablet; Take 2 tablets (100 mg total) by mouth every 6 (six) hours as needed.  Dispense: 180 tablet; Refill: 3  8. Gastroesophageal reflux disease, esophagitis presence not specified - CMP14+EGFR  9. Vitamin D deficiency - CMP14+EGFR - Vit D  25 hydroxy (rtn osteoporosis monitoring)  10. Hyperlipidemia - CMP14+EGFR - Lipid panel   Continue all meds Labs pending Health Maintenance reviewed-hemoccult cards given to patient with directions Diet and exercise encouraged RTO 3 months  Evelina Dun, FNP

## 2014-09-22 NOTE — Patient Instructions (Signed)

## 2014-09-23 LAB — THYROID PANEL WITH TSH
Free Thyroxine Index: 3 (ref 1.2–4.9)
T3 Uptake Ratio: 26 % (ref 24–39)
T4 TOTAL: 11.4 ug/dL (ref 4.5–12.0)
TSH: 2.38 u[IU]/mL (ref 0.450–4.500)

## 2014-09-23 LAB — CMP14+EGFR
ALT: 6 IU/L (ref 0–32)
AST: 9 IU/L (ref 0–40)
Albumin/Globulin Ratio: 1.2 (ref 1.1–2.5)
Albumin: 3.7 g/dL (ref 3.6–4.8)
Alkaline Phosphatase: 133 IU/L — ABNORMAL HIGH (ref 39–117)
BILIRUBIN TOTAL: 0.3 mg/dL (ref 0.0–1.2)
BUN/Creatinine Ratio: 18 (ref 11–26)
BUN: 15 mg/dL (ref 8–27)
CALCIUM: 9.3 mg/dL (ref 8.7–10.3)
CHLORIDE: 96 mmol/L — AB (ref 97–108)
CO2: 29 mmol/L (ref 18–29)
Creatinine, Ser: 0.84 mg/dL (ref 0.57–1.00)
GFR, EST AFRICAN AMERICAN: 84 mL/min/{1.73_m2} (ref >59)
GFR, EST NON AFRICAN AMERICAN: 73 mL/min/{1.73_m2} (ref >59)
Globulin, Total: 3.1 g/dL (ref 1.5–4.5)
Glucose: 133 mg/dL — ABNORMAL HIGH (ref 65–99)
POTASSIUM: 4.9 mmol/L (ref 3.5–5.2)
Sodium: 139 mmol/L (ref 134–144)
TOTAL PROTEIN: 6.8 g/dL (ref 6.0–8.5)

## 2014-09-23 LAB — VITAMIN D 25 HYDROXY (VIT D DEFICIENCY, FRACTURES): Vit D, 25-Hydroxy: 33.7 ng/mL (ref 30.0–100.0)

## 2014-09-23 LAB — LIPID PANEL
Chol/HDL Ratio: 3.1 ratio units (ref 0.0–4.4)
Cholesterol, Total: 171 mg/dL (ref 100–199)
HDL: 55 mg/dL (ref >39)
LDL CALC: 89 mg/dL (ref 0–99)
Triglycerides: 136 mg/dL (ref 0–149)
VLDL CHOLESTEROL CAL: 27 mg/dL (ref 5–40)

## 2014-09-24 ENCOUNTER — Other Ambulatory Visit: Payer: Self-pay | Admitting: Family

## 2014-09-29 ENCOUNTER — Telehealth: Payer: Self-pay | Admitting: Family

## 2014-09-29 MED ORDER — PREGABALIN 50 MG PO CAPS: 50.0000 mg | ORAL_CAPSULE | Freq: Three times a day (TID) | ORAL | Status: DC

## 2014-09-29 NOTE — Telephone Encounter (Signed)
Please review and advise.

## 2014-09-29 NOTE — Telephone Encounter (Signed)
Rx ready for pick up. 

## 2014-09-29 NOTE — Telephone Encounter (Signed)
Patient aware rx ready to be picked up 

## 2014-09-29 NOTE — Telephone Encounter (Signed)
Prescription sent to pharmacy.

## 2014-10-02 ENCOUNTER — Telehealth: Payer: Self-pay

## 2014-10-02 MED ORDER — GABAPENTIN 300 MG PO CAPS: 300.0000 mg | ORAL_CAPSULE | Freq: Three times a day (TID) | ORAL | Status: DC

## 2014-10-02 NOTE — Telephone Encounter (Signed)
Insurance will not cover Lyrica without prior authorization   Patient would like to try gabapentin

## 2014-10-02 NOTE — Telephone Encounter (Signed)
Prescription sent to pharmacy.

## 2014-10-14 ENCOUNTER — Other Ambulatory Visit: Payer: Self-pay | Admitting: Family

## 2014-11-03 ENCOUNTER — Telehealth: Payer: Self-pay | Admitting: *Deleted

## 2014-11-03 MED ORDER — GABAPENTIN 400 MG PO CAPS: 400.0000 mg | ORAL_CAPSULE | Freq: Three times a day (TID) | ORAL | Status: DC

## 2014-11-03 NOTE — Telephone Encounter (Signed)
Pt has been taking gabapentin about a month, no help. It is time to refill it, could we increase dosage? Please have nurse let pt know. [phone]

## 2014-11-04 ENCOUNTER — Other Ambulatory Visit: Payer: Self-pay

## 2014-11-04 MED ORDER — TRIAMCINOLONE ACETONIDE 0.1 % EX CREA: 1.0000 "application " | TOPICAL_CREAM | Freq: Two times a day (BID) | CUTANEOUS | Status: DC

## 2014-11-27 ENCOUNTER — Other Ambulatory Visit: Payer: Self-pay | Admitting: Family Medicine

## 2014-12-23 ENCOUNTER — Other Ambulatory Visit: Payer: Self-pay | Admitting: Family Medicine

## 2014-12-24 ENCOUNTER — Ambulatory Visit: Payer: Medicare Other | Admitting: Family

## 2015-01-07 ENCOUNTER — Encounter: Payer: Self-pay | Admitting: Family

## 2015-01-07 ENCOUNTER — Ambulatory Visit (INDEPENDENT_AMBULATORY_CARE_PROVIDER_SITE_OTHER): Payer: BLUE CROSS/BLUE SHIELD | Admitting: Family

## 2015-01-07 VITALS — BP 130/73 | HR 78 | Temp 99.3°F | Ht 63.0 in | Wt 313.4 lb

## 2015-01-07 DIAGNOSIS — R609 Edema, unspecified: Secondary | ICD-10-CM

## 2015-01-07 DIAGNOSIS — Z23 Encounter for immunization: Secondary | ICD-10-CM | POA: Diagnosis not present

## 2015-01-07 DIAGNOSIS — M1990 Unspecified osteoarthritis, unspecified site: Secondary | ICD-10-CM | POA: Diagnosis not present

## 2015-01-07 DIAGNOSIS — K219 Gastro-esophageal reflux disease without esophagitis: Secondary | ICD-10-CM | POA: Diagnosis not present

## 2015-01-07 DIAGNOSIS — J441 Chronic obstructive pulmonary disease with (acute) exacerbation: Secondary | ICD-10-CM | POA: Diagnosis not present

## 2015-01-07 DIAGNOSIS — E559 Vitamin D deficiency, unspecified: Secondary | ICD-10-CM

## 2015-01-07 DIAGNOSIS — E1165 Type 2 diabetes mellitus with hyperglycemia: Secondary | ICD-10-CM

## 2015-01-07 DIAGNOSIS — I10 Essential (primary) hypertension: Secondary | ICD-10-CM

## 2015-01-07 DIAGNOSIS — E785 Hyperlipidemia, unspecified: Secondary | ICD-10-CM | POA: Diagnosis not present

## 2015-01-07 DIAGNOSIS — E039 Hypothyroidism, unspecified: Secondary | ICD-10-CM | POA: Diagnosis not present

## 2015-01-07 LAB — POCT GLYCOSYLATED HEMOGLOBIN (HGB A1C): Hemoglobin A1C: 7.1

## 2015-01-07 MED ORDER — LISINOPRIL 10 MG PO TABS: 10.0000 mg | ORAL_TABLET | Freq: Every day | ORAL | Status: DC

## 2015-01-07 MED ORDER — FUROSEMIDE 20 MG PO TABS: 20.0000 mg | ORAL_TABLET | Freq: Every day | ORAL | Status: DC

## 2015-01-07 MED ORDER — ALBUTEROL SULFATE (2.5 MG/3ML) 0.083% IN NEBU: 2.5000 mg | INHALATION_SOLUTION | Freq: Four times a day (QID) | RESPIRATORY_TRACT | Status: DC | PRN

## 2015-01-07 MED ORDER — GLIMEPIRIDE 4 MG PO TABS: ORAL_TABLET | ORAL | Status: DC

## 2015-01-07 MED ORDER — MELOXICAM 15 MG PO TABS: 15.0000 mg | ORAL_TABLET | Freq: Every day | ORAL | Status: DC

## 2015-01-07 MED ORDER — LEVOTHYROXINE SODIUM 50 MCG PO TABS: 50.0000 ug | ORAL_TABLET | Freq: Every day | ORAL | Status: DC

## 2015-01-07 MED ORDER — SIMVASTATIN 20 MG PO TABS: 20.0000 mg | ORAL_TABLET | Freq: Every day | ORAL | Status: DC

## 2015-01-07 NOTE — Progress Notes (Signed)
Subjective:    Patient ID: Jennifer Rasmussen, female    DOB: 1951-05-01, 64 y.o.   MRN: [id number]  Hypertension This is a chronic problem. The current episode started more than 1 year ago. The problem has been resolved since onset. The problem is controlled. Associated symptoms include peripheral edema. Pertinent negatives include no blurred vision, chest pain, headaches, palpitations or shortness of breath. Risk factors for coronary artery disease include dyslipidemia, diabetes mellitus, family history, obesity, sedentary lifestyle and post-menopausal state. Past treatments include ACE inhibitors and beta blockers. The current treatment provides moderate improvement. Hypertensive end-organ damage includes a thyroid problem. There is no history of kidney disease, CAD/MI, CVA, heart failure or PVD.  Hyperlipidemia This is a chronic problem. The current episode started more than 1 year ago. The problem is controlled. Recent lipid tests were reviewed and are normal. Exacerbating diseases include diabetes and hypothyroidism. Factors aggravating her hyperlipidemia include smoking and fatty foods. Pertinent negatives include no chest pain, leg pain, myalgias or shortness of breath. Current antihyperlipidemic treatment includes statins. Risk factors for coronary artery disease include diabetes mellitus, dyslipidemia, hypertension, obesity, post-menopausal and family history.  Diabetes She presents for her follow-up diabetic visit. She has type 2 diabetes mellitus. Her disease course has been stable. Pertinent negatives for hypoglycemia include no confusion, dizziness, headaches, mood changes or nervousness/anxiousness. Pertinent negatives for diabetes include no blurred vision, no chest pain, no foot paresthesias, no foot ulcerations and no visual change. Pertinent negatives for hypoglycemia complications include no blackouts, no hospitalization and no nocturnal hypoglycemia. Symptoms are worsening. Diabetic  complications include peripheral neuropathy. Pertinent negatives for diabetic complications include no CVA, heart disease, nephropathy or PVD. Risk factors for coronary artery disease include diabetes mellitus, dyslipidemia, hypertension, obesity, post-menopausal, family history, stress and tobacco exposure. Current diabetic treatment includes oral agent (dual therapy). She is compliant with treatment most of the time. She is following a generally unhealthy diet. Her breakfast blood glucose range is generally 110-130 mg/dl. An ACE inhibitor/angiotensin II receptor blocker is being taken. Eye exam is current.  Thyroid Problem Presents for follow-up visit. Symptoms include constipation, diarrhea and hair loss. Patient reports no anxiety, depressed mood, dry skin, hoarse voice, leg swelling, nail problem, palpitations or visual change. The symptoms have been stable. Past treatments include levothyroxine. The treatment provided significant relief. Her past medical history is significant for diabetes and hyperlipidemia. There is no history of heart failure.  Gastrophageal Reflux She reports no belching, no chest pain, no coughing, no globus sensation, no hoarse voice or no sore throat. This is a chronic problem. The current episode started more than 1 year ago. The problem occurs rarely. The problem has been resolved. The symptoms are aggravated by lying down and certain foods. Risk factors include obesity. She has tried a PPI for the symptoms. The treatment provided moderate relief.  COPD Pt take Symbicort BID and pt states it seems to be working well with no complaints.     Review of Systems  Constitutional: Negative.   HENT: Negative.  Negative for hoarse voice and sore throat.   Eyes: Negative.  Negative for blurred vision.  Respiratory: Negative.  Negative for cough and shortness of breath.   Cardiovascular: Negative.  Negative for chest pain and palpitations.  Gastrointestinal: Positive for diarrhea  and constipation.  Endocrine: Negative.   Genitourinary: Negative.   Musculoskeletal: Negative.  Negative for myalgias.  Neurological: Negative.  Negative for dizziness and headaches.  Hematological: Negative.   Psychiatric/Behavioral: Negative.  Negative for  confusion. The patient is not nervous/anxious.   All other systems reviewed and are negative.      Objective:   Physical Exam  Constitutional: She is oriented to person, place, and time. She appears well-developed and well-nourished. No distress.  HENT:  Head: Normocephalic and atraumatic.  Right Ear: External ear normal.  Left Ear: External ear normal.  Nose: Nose normal.  Mouth/Throat: Oropharynx is clear and moist.  Eyes: Pupils are equal, round, and reactive to light.  Neck: Normal range of motion. Neck supple. No thyromegaly present.  Cardiovascular: Normal rate, regular rhythm, normal heart sounds and intact distal pulses.   No murmur heard. Pulmonary/Chest: Effort normal and breath sounds normal. No respiratory distress. She has no wheezes.  Abdominal: Soft. Bowel sounds are normal. She exhibits no distension. There is no tenderness.  Musculoskeletal: Normal range of motion. She exhibits edema (3+ in BLE). She exhibits no tenderness.  Neurological: She is alert and oriented to person, place, and time. She has normal reflexes. No cranial nerve deficit.  Skin: Skin is warm and dry.  Psychiatric: She has a normal mood and affect. Her behavior is normal. Judgment and thought content normal.  Vitals reviewed.   BP 130/73 mmHg  Pulse 78  Temp(Src) 99.3 F (37.4 C) (Oral)  Ht 5' 3" (1.6 m)  Wt 313 lb 6.4 oz (142.157 kg)  BMI 55.53 kg/m2  LMP 10/24/2001       Assessment & Plan:  1. Essential hypertension, benign - lisinopril (PRINIVIL,ZESTRIL) 10 MG tablet; Take 1 tablet (10 mg total) by mouth daily.  Dispense: 90 tablet; Refill: 3 - CMP14+EGFR  2. COPD exacerbation - CMP14+EGFR  3. Gastroesophageal reflux  disease, esophagitis presence not specified - CMP14+EGFR  4. Type 2 diabetes mellitus with hyperglycemia - glimepiride (AMARYL) 4 MG tablet; TAKE 2 TABLETS DAILY BEFORE BREAKFAST  Dispense: 180 tablet; Refill: 2 - POCT glycosylated hemoglobin (Hb A1C) - CMP14+EGFR  5. Hypothyroidism, unspecified hypothyroidism type - levothyroxine (SYNTHROID, LEVOTHROID) 50 MCG tablet; Take 1 tablet (50 mcg total) by mouth daily.  Dispense: 90 tablet; Refill: 4 - CMP14+EGFR - Thyroid Panel With TSH  6. Arthritis - meloxicam (MOBIC) 15 MG tablet; Take 1 tablet (15 mg total) by mouth daily.  Dispense: 90 tablet; Refill: 3 - CMP14+EGFR  7. Hyperlipidemia - CMP14+EGFR - Lipid panel  8. Morbid obesity - CMP14+EGFR  9. Vitamin D deficiency - CMP14+EGFR - Vit D  25 hydroxy (rtn osteoporosis monitoring)  10. Peripheral edema - furosemide (LASIX) 20 MG tablet; Take 1 tablet (20 mg total) by mouth daily.  Dispense: 90 tablet; Refill: 4 - CMP14+EGFR  11. Hyperlipemia - simvastatin (ZOCOR) 20 MG tablet; Take 1 tablet (20 mg total) by mouth daily at 6 PM.  Dispense: 90 tablet; Refill: 3 - CMP14+EGFR   Continue all meds Labs pending Health Maintenance reviewed-hemoccult cards given to patient with directions Diet and exercise encouraged RTO 3 months  Evelina Dun, FNP

## 2015-01-07 NOTE — Addendum Note (Signed)
Addended by: Almeta Monas on: 01/07/2015 09:23 AM   Modules accepted: Orders

## 2015-01-07 NOTE — Patient Instructions (Signed)

## 2015-01-08 LAB — CMP14+EGFR
A/G RATIO: 1.2 (ref 1.1–2.5)
ALT: 7 IU/L (ref 0–32)
AST: 8 IU/L (ref 0–40)
Albumin: 3.5 g/dL — ABNORMAL LOW (ref 3.6–4.8)
Alkaline Phosphatase: 124 IU/L — ABNORMAL HIGH (ref 39–117)
BUN/Creatinine Ratio: 15 (ref 11–26)
BUN: 13 mg/dL (ref 8–27)
Bilirubin Total: 0.3 mg/dL (ref 0.0–1.2)
CO2: 31 mmol/L — ABNORMAL HIGH (ref 18–29)
Calcium: 9 mg/dL (ref 8.7–10.3)
Chloride: 96 mmol/L — ABNORMAL LOW (ref 97–108)
Creatinine, Ser: 0.87 mg/dL (ref 0.57–1.00)
GFR, EST AFRICAN AMERICAN: 81 mL/min/{1.73_m2} (ref >59)
GFR, EST NON AFRICAN AMERICAN: 70 mL/min/{1.73_m2} (ref >59)
GLOBULIN, TOTAL: 3 g/dL (ref 1.5–4.5)
Glucose: 109 mg/dL — ABNORMAL HIGH (ref 65–99)
Potassium: 4.7 mmol/L (ref 3.5–5.2)
SODIUM: 140 mmol/L (ref 134–144)
Total Protein: 6.5 g/dL (ref 6.0–8.5)

## 2015-01-08 LAB — VITAMIN D 25 HYDROXY (VIT D DEFICIENCY, FRACTURES): VIT D 25 HYDROXY: 38.2 ng/mL (ref 30.0–100.0)

## 2015-01-08 LAB — LIPID PANEL
CHOLESTEROL TOTAL: 144 mg/dL (ref 100–199)
Chol/HDL Ratio: 3 ratio units (ref 0.0–4.4)
HDL: 48 mg/dL (ref >39)
LDL CALC: 72 mg/dL (ref 0–99)
Triglycerides: 118 mg/dL (ref 0–149)
VLDL CHOLESTEROL CAL: 24 mg/dL (ref 5–40)

## 2015-01-08 LAB — THYROID PANEL WITH TSH
FREE THYROXINE INDEX: 2.7 (ref 1.2–4.9)
T3 UPTAKE RATIO: 27 % (ref 24–39)
T4 TOTAL: 9.9 ug/dL (ref 4.5–12.0)
TSH: 4.43 u[IU]/mL (ref 0.450–4.500)

## 2015-01-09 ENCOUNTER — Encounter: Payer: Self-pay | Admitting: *Deleted

## 2015-01-12 ENCOUNTER — Other Ambulatory Visit: Payer: Self-pay

## 2015-01-12 DIAGNOSIS — R609 Edema, unspecified: Secondary | ICD-10-CM

## 2015-01-12 MED ORDER — GABAPENTIN 400 MG PO CAPS: 400.0000 mg | ORAL_CAPSULE | Freq: Three times a day (TID) | ORAL | Status: DC

## 2015-01-14 ENCOUNTER — Other Ambulatory Visit: Payer: Self-pay | Admitting: Family Medicine

## 2015-02-10 ENCOUNTER — Telehealth: Payer: Self-pay | Admitting: Family

## 2015-02-10 ENCOUNTER — Encounter: Payer: Self-pay | Admitting: *Deleted

## 2015-02-10 ENCOUNTER — Other Ambulatory Visit: Payer: Self-pay | Admitting: Family

## 2015-02-10 NOTE — Telephone Encounter (Signed)
Mail order was wanting patient to get a 3 month refill on tramadol.  Patient was advised that this was not likely to happen.

## 2015-03-06 ENCOUNTER — Other Ambulatory Visit: Payer: Self-pay | Admitting: Family

## 2015-03-09 NOTE — Telephone Encounter (Signed)
Last seen 01/07/15  Neysa Bonito  If approved print

## 2015-03-09 NOTE — Telephone Encounter (Signed)
Pt aware written Rx is at front desk ready for pickup  

## 2015-03-09 NOTE — Telephone Encounter (Signed)
RX ready for pick up 

## 2015-04-04 ENCOUNTER — Other Ambulatory Visit: Payer: Self-pay | Admitting: Family

## 2015-04-07 ENCOUNTER — Encounter: Payer: Self-pay | Admitting: Family

## 2015-04-07 ENCOUNTER — Ambulatory Visit (INDEPENDENT_AMBULATORY_CARE_PROVIDER_SITE_OTHER): Payer: BLUE CROSS/BLUE SHIELD | Admitting: Family

## 2015-04-07 VITALS — BP 139/69 | HR 83 | Temp 97.6°F | Ht 63.0 in | Wt 308.0 lb

## 2015-04-07 DIAGNOSIS — J441 Chronic obstructive pulmonary disease with (acute) exacerbation: Secondary | ICD-10-CM | POA: Diagnosis not present

## 2015-04-07 DIAGNOSIS — E039 Hypothyroidism, unspecified: Secondary | ICD-10-CM | POA: Diagnosis not present

## 2015-04-07 DIAGNOSIS — E559 Vitamin D deficiency, unspecified: Secondary | ICD-10-CM

## 2015-04-07 DIAGNOSIS — Z1211 Encounter for screening for malignant neoplasm of colon: Secondary | ICD-10-CM | POA: Diagnosis not present

## 2015-04-07 DIAGNOSIS — R609 Edema, unspecified: Secondary | ICD-10-CM | POA: Diagnosis not present

## 2015-04-07 DIAGNOSIS — E1165 Type 2 diabetes mellitus with hyperglycemia: Secondary | ICD-10-CM | POA: Diagnosis not present

## 2015-04-07 DIAGNOSIS — E785 Hyperlipidemia, unspecified: Secondary | ICD-10-CM | POA: Diagnosis not present

## 2015-04-07 DIAGNOSIS — K219 Gastro-esophageal reflux disease without esophagitis: Secondary | ICD-10-CM

## 2015-04-07 DIAGNOSIS — Z1159 Encounter for screening for other viral diseases: Secondary | ICD-10-CM

## 2015-04-07 DIAGNOSIS — I10 Essential (primary) hypertension: Secondary | ICD-10-CM

## 2015-04-07 DIAGNOSIS — M1990 Unspecified osteoarthritis, unspecified site: Secondary | ICD-10-CM

## 2015-04-07 LAB — POCT GLYCOSYLATED HEMOGLOBIN (HGB A1C): Hemoglobin A1C: 7.2

## 2015-04-07 LAB — POCT UA - MICROALBUMIN: Microalbumin Ur, POC: 20 mg/L

## 2015-04-07 NOTE — Addendum Note (Signed)
Addended by: Orma Render F on: 04/07/2015 11:39 AM   Modules accepted: Orders

## 2015-04-07 NOTE — Addendum Note (Signed)
Addended by: Jannifer Rodney A on: 04/07/2015 11:18 AM   Modules accepted: Orders

## 2015-04-07 NOTE — Progress Notes (Signed)
Subjective:    Patient ID: Jennifer Rasmussen, female    DOB: [birth year], 64 y.o.   MRN: [id number]  Pt presents to the office today for chronic follow up.  Diabetes She presents for her follow-up diabetic visit. She has type 2 diabetes mellitus. Her disease course has been stable. Pertinent negatives for hypoglycemia include no confusion, dizziness, headaches, mood changes or nervousness/anxiousness. Pertinent negatives for diabetes include no blurred vision, no chest pain, no foot paresthesias, no foot ulcerations and no visual change. Pertinent negatives for hypoglycemia complications include no blackouts, no hospitalization and no nocturnal hypoglycemia. Symptoms are worsening. Diabetic complications include peripheral neuropathy (in "hands"). Pertinent negatives for diabetic complications include no CVA, heart disease, nephropathy or PVD. Risk factors for coronary artery disease include diabetes mellitus, dyslipidemia, hypertension, obesity, post-menopausal, family history, stress and tobacco exposure. Current diabetic treatment includes oral agent (dual therapy). She is compliant with treatment most of the time. She is following a generally unhealthy diet. Her breakfast blood glucose range is generally 110-130 mg/dl. An ACE inhibitor/angiotensin II receptor blocker is being taken. Eye exam is current.  Hypertension This is a chronic problem. The current episode started more than 1 year ago. The problem is unchanged. The problem is uncontrolled. Associated symptoms include peripheral edema and shortness of breath ("when walking"). Pertinent negatives include no blurred vision, chest pain, headaches or palpitations. Risk factors for coronary artery disease include dyslipidemia, diabetes mellitus, family history, obesity, sedentary lifestyle and post-menopausal state. Past treatments include ACE inhibitors and beta blockers. The current treatment provides moderate improvement. Hypertensive end-organ damage  includes a thyroid problem. There is no history of kidney disease, CAD/MI, CVA, heart failure or PVD.  Hyperlipidemia This is a chronic problem. The current episode started more than 1 year ago. The problem is controlled. Recent lipid tests were reviewed and are normal. Exacerbating diseases include diabetes and hypothyroidism. Factors aggravating her hyperlipidemia include smoking and fatty foods. Associated symptoms include shortness of breath ("when walking"). Pertinent negatives include no chest pain, leg pain or myalgias. Current antihyperlipidemic treatment includes statins. Risk factors for coronary artery disease include diabetes mellitus, dyslipidemia, hypertension, obesity, post-menopausal and family history.  Thyroid Problem Presents for follow-up visit. Symptoms include diarrhea and hair loss. Patient reports no anxiety, constipation, depressed mood, dry skin, hoarse voice, leg swelling, nail problem, palpitations or visual change. The symptoms have been stable. Past treatments include levothyroxine. The treatment provided significant relief. Her past medical history is significant for diabetes and hyperlipidemia. There is no history of heart failure.  Gastrophageal Reflux She reports no belching, no chest pain, no coughing, no globus sensation, no heartburn, no hoarse voice or no sore throat. This is a chronic problem. The current episode started more than 1 year ago. The problem occurs rarely. The problem has been resolved. The symptoms are aggravated by lying down and certain foods. Risk factors include obesity. She has tried a PPI for the symptoms. The treatment provided moderate relief.      Review of Systems  Constitutional: Negative.   HENT: Negative.  Negative for hoarse voice and sore throat.   Eyes: Negative.  Negative for blurred vision.  Respiratory: Positive for shortness of breath ("when walking"). Negative for cough.   Cardiovascular: Negative.  Negative for chest pain and  palpitations.  Gastrointestinal: Positive for diarrhea. Negative for heartburn and constipation.  Endocrine: Negative.   Genitourinary: Negative.   Musculoskeletal: Negative.  Negative for myalgias.  Neurological: Negative.  Negative for dizziness and headaches.  Hematological: Negative.  Psychiatric/Behavioral: Negative.  Negative for confusion. The patient is not nervous/anxious.   All other systems reviewed and are negative.      Objective:   Physical Exam  Constitutional: She is oriented to person, place, and time. She appears well-developed and well-nourished. No distress.  HENT:  Head: Normocephalic and atraumatic.  Right Ear: External ear normal.  Left Ear: External ear normal.  Nose: Nose normal.  Mouth/Throat: Oropharynx is clear and moist.  Eyes: Pupils are equal, round, and reactive to light.  Neck: Normal range of motion. Neck supple. No thyromegaly present.  Cardiovascular: Normal rate, regular rhythm, normal heart sounds and intact distal pulses.   No murmur heard. Pulmonary/Chest: Effort normal and breath sounds normal. No respiratory distress. She has no wheezes.  Abdominal: Soft. Bowel sounds are normal. She exhibits no distension. There is no tenderness.  Musculoskeletal: Normal range of motion. She exhibits edema (2+ in BLE, unna boots present). She exhibits no tenderness.  Neurological: She is alert and oriented to person, place, and time. She has normal reflexes. No cranial nerve deficit.  Skin: Skin is warm and dry.  Psychiatric: She has a normal mood and affect. Her behavior is normal. Judgment and thought content normal.  Vitals reviewed.     BP 164/82 mmHg  Pulse 84  Temp(Src) 97.6 F (36.4 C) (Oral)  Ht $R'5\' 3"'rl$  (1.6 m)  Wt 308 lb (139.708 kg)  BMI 54.57 kg/m2  LMP 10/24/2001     Assessment & Plan:  1. Essential hypertension, benign - CMP14+EGFR  2. COPD exacerbation - CMP14+EGFR  3. Gastroesophageal reflux disease, esophagitis presence not  specified - CMP14+EGFR  4. Type 2 diabetes mellitus with hyperglycemia - POCT glycosylated hemoglobin (Hb A1C) - POCT UA - Microalbumin - CMP14+EGFR  5. Hypothyroidism, unspecified hypothyroidism type - CMP14+EGFR - Thyroid Panel With TSH  6. Arthritis - CMP14+EGFR  7. Peripheral edema - CMP14+EGFR  8. Morbid obesity - CMP14+EGFR  9. Vitamin D deficiency - CMP14+EGFR - Vit D  25 hydroxy (rtn osteoporosis monitoring)  10. Hyperlipidemia - CMP14+EGFR - Lipid panel  11. Colon cancer screening - Fecal occult blood, imunochemical; Future   Continue all meds Labs pending Health Maintenance reviewed Diet and exercise encouraged RTO 3 months   Evelina Dun, FNP

## 2015-04-07 NOTE — Patient Instructions (Signed)

## 2015-04-08 ENCOUNTER — Other Ambulatory Visit: Payer: Self-pay | Admitting: Family

## 2015-04-08 LAB — CMP14+EGFR
ALBUMIN: 3.8 g/dL (ref 3.6–4.8)
ALK PHOS: 139 IU/L — AB (ref 39–117)
ALT: 8 IU/L (ref 0–32)
AST: 11 IU/L (ref 0–40)
Albumin/Globulin Ratio: 1.1 (ref 1.1–2.5)
BILIRUBIN TOTAL: 0.4 mg/dL (ref 0.0–1.2)
BUN / CREAT RATIO: 17 (ref 11–26)
BUN: 16 mg/dL (ref 8–27)
CHLORIDE: 96 mmol/L — AB (ref 97–108)
CO2: 28 mmol/L (ref 18–29)
CREATININE: 0.94 mg/dL (ref 0.57–1.00)
Calcium: 9.7 mg/dL (ref 8.7–10.3)
GFR calc Af Amer: 74 mL/min/{1.73_m2} (ref >59)
GFR calc non Af Amer: 64 mL/min/{1.73_m2} (ref >59)
GLOBULIN, TOTAL: 3.4 g/dL (ref 1.5–4.5)
GLUCOSE: 103 mg/dL — AB (ref 65–99)
Potassium: 4.4 mmol/L (ref 3.5–5.2)
SODIUM: 141 mmol/L (ref 134–144)
Total Protein: 7.2 g/dL (ref 6.0–8.5)

## 2015-04-08 LAB — HEPATITIS C ANTIBODY: Hep C Virus Ab: 0.1 s/co ratio (ref 0.0–0.9)

## 2015-04-08 LAB — LIPID PANEL
CHOLESTEROL TOTAL: 169 mg/dL (ref 100–199)
Chol/HDL Ratio: 3.1 ratio units (ref 0.0–4.4)
HDL: 55 mg/dL (ref >39)
LDL CALC: 87 mg/dL (ref 0–99)
TRIGLYCERIDES: 137 mg/dL (ref 0–149)
VLDL Cholesterol Cal: 27 mg/dL (ref 5–40)

## 2015-04-08 LAB — THYROID PANEL WITH TSH
Free Thyroxine Index: 2.4 (ref 1.2–4.9)
T3 Uptake Ratio: 22 % — ABNORMAL LOW (ref 24–39)
T4, Total: 11 ug/dL (ref 4.5–12.0)
TSH: 3.29 u[IU]/mL (ref 0.450–4.500)

## 2015-04-08 LAB — MICROALBUMIN, URINE: Microalbumin, Urine: 20.8 ug/mL

## 2015-04-08 LAB — VITAMIN D 25 HYDROXY (VIT D DEFICIENCY, FRACTURES): Vit D, 25-Hydroxy: 38.1 ng/mL (ref 30.0–100.0)

## 2015-04-08 MED ORDER — METFORMIN HCL 1000 MG PO TABS: 1000.0000 mg | ORAL_TABLET | Freq: Two times a day (BID) | ORAL | Status: DC

## 2015-04-08 NOTE — Telephone Encounter (Signed)
Pt aware written Rx is at front desk ready for pickup  

## 2015-04-08 NOTE — Telephone Encounter (Signed)
RX ready for pick up 

## 2015-04-08 NOTE — Telephone Encounter (Signed)
Last filled 03/09/15, last seen 04/07/15. Rx will print

## 2015-04-09 ENCOUNTER — Other Ambulatory Visit: Payer: BLUE CROSS/BLUE SHIELD

## 2015-04-09 DIAGNOSIS — Z1211 Encounter for screening for malignant neoplasm of colon: Secondary | ICD-10-CM

## 2015-04-09 DIAGNOSIS — Z1212 Encounter for screening for malignant neoplasm of rectum: Secondary | ICD-10-CM

## 2015-04-09 NOTE — Progress Notes (Signed)
Lab only 

## 2015-04-10 ENCOUNTER — Ambulatory Visit: Payer: BLUE CROSS/BLUE SHIELD | Admitting: Family

## 2015-04-10 LAB — FECAL OCCULT BLOOD, IMMUNOCHEMICAL: Fecal Occult Bld: NEGATIVE

## 2015-04-21 DIAGNOSIS — J449 Chronic obstructive pulmonary disease, unspecified: Secondary | ICD-10-CM | POA: Diagnosis not present

## 2015-04-21 DIAGNOSIS — E669 Obesity, unspecified: Secondary | ICD-10-CM | POA: Diagnosis not present

## 2015-04-21 DIAGNOSIS — M160 Bilateral primary osteoarthritis of hip: Secondary | ICD-10-CM | POA: Diagnosis not present

## 2015-04-21 DIAGNOSIS — Z96653 Presence of artificial knee joint, bilateral: Secondary | ICD-10-CM | POA: Diagnosis not present

## 2015-05-04 ENCOUNTER — Other Ambulatory Visit: Payer: Self-pay | Admitting: Family

## 2015-05-05 ENCOUNTER — Other Ambulatory Visit: Payer: Self-pay

## 2015-05-05 MED ORDER — GABAPENTIN 400 MG PO CAPS: 400.0000 mg | ORAL_CAPSULE | Freq: Three times a day (TID) | ORAL | Status: DC

## 2015-05-06 DIAGNOSIS — M160 Bilateral primary osteoarthritis of hip: Secondary | ICD-10-CM | POA: Diagnosis not present

## 2015-05-07 NOTE — Telephone Encounter (Signed)
RX ready for pick up 

## 2015-05-07 NOTE — Telephone Encounter (Signed)
Patient aware that rx is ready to be picked up.  

## 2015-05-07 NOTE — Telephone Encounter (Signed)
Last seen 029/20/16. Last filled 04/08/15. Rx will print

## 2015-05-31 ENCOUNTER — Other Ambulatory Visit: Payer: Self-pay | Admitting: Family

## 2015-06-15 ENCOUNTER — Other Ambulatory Visit: Payer: Self-pay | Admitting: Family

## 2015-07-08 ENCOUNTER — Ambulatory Visit (INDEPENDENT_AMBULATORY_CARE_PROVIDER_SITE_OTHER): Payer: BLUE CROSS/BLUE SHIELD | Admitting: Family

## 2015-07-08 ENCOUNTER — Encounter: Payer: Self-pay | Admitting: Family

## 2015-07-08 VITALS — BP 121/74 | HR 74 | Temp 97.9°F | Ht 63.0 in | Wt 299.0 lb

## 2015-07-08 DIAGNOSIS — K219 Gastro-esophageal reflux disease without esophagitis: Secondary | ICD-10-CM

## 2015-07-08 DIAGNOSIS — R609 Edema, unspecified: Secondary | ICD-10-CM | POA: Diagnosis not present

## 2015-07-08 DIAGNOSIS — R600 Localized edema: Secondary | ICD-10-CM

## 2015-07-08 DIAGNOSIS — I10 Essential (primary) hypertension: Secondary | ICD-10-CM

## 2015-07-08 DIAGNOSIS — E1142 Type 2 diabetes mellitus with diabetic polyneuropathy: Secondary | ICD-10-CM | POA: Diagnosis not present

## 2015-07-08 DIAGNOSIS — M1990 Unspecified osteoarthritis, unspecified site: Secondary | ICD-10-CM | POA: Diagnosis not present

## 2015-07-08 DIAGNOSIS — E1165 Type 2 diabetes mellitus with hyperglycemia: Secondary | ICD-10-CM | POA: Diagnosis not present

## 2015-07-08 DIAGNOSIS — J441 Chronic obstructive pulmonary disease with (acute) exacerbation: Secondary | ICD-10-CM | POA: Diagnosis not present

## 2015-07-08 DIAGNOSIS — E559 Vitamin D deficiency, unspecified: Secondary | ICD-10-CM | POA: Diagnosis not present

## 2015-07-08 DIAGNOSIS — E039 Hypothyroidism, unspecified: Secondary | ICD-10-CM

## 2015-07-08 DIAGNOSIS — E785 Hyperlipidemia, unspecified: Secondary | ICD-10-CM

## 2015-07-08 LAB — POCT GLYCOSYLATED HEMOGLOBIN (HGB A1C): Hemoglobin A1C: 6.6

## 2015-07-08 MED ORDER — TRAMADOL HCL 50 MG PO TABS: ORAL_TABLET | ORAL | Status: DC

## 2015-07-08 MED ORDER — TRAMADOL HCL 50 MG PO TABS: 100.0000 mg | ORAL_TABLET | Freq: Three times a day (TID) | ORAL | Status: DC | PRN

## 2015-07-08 MED ORDER — GABAPENTIN 400 MG PO CAPS: 400.0000 mg | ORAL_CAPSULE | Freq: Three times a day (TID) | ORAL | Status: DC

## 2015-07-08 NOTE — Progress Notes (Addendum)
 Subjective:    Patient ID: Jennifer Rasmussen, female    DOB: 06/15/1951, 64 y.o.   MRN: [id number]  PT presents to the office today for chronic follow up.  Diabetes She presents for her follow-up diabetic visit. She has type 2 diabetes mellitus. Her disease course has been stable. Pertinent negatives for hypoglycemia include no confusion, dizziness, headaches, mood changes or nervousness/anxiousness. Pertinent negatives for diabetes include no blurred vision, no chest pain, no foot paresthesias, no foot ulcerations and no visual change. Pertinent negatives for hypoglycemia complications include no blackouts, no hospitalization and no nocturnal hypoglycemia. Symptoms are worsening. Diabetic complications include peripheral neuropathy (in "hands"). Pertinent negatives for diabetic complications include no CVA, heart disease, nephropathy or PVD. Risk factors for coronary artery disease include diabetes mellitus, dyslipidemia, hypertension, obesity, post-menopausal, family history, stress and tobacco exposure. Current diabetic treatment includes oral agent (dual therapy). She is compliant with treatment most of the time. She is following a generally unhealthy diet. Her breakfast blood glucose range is generally 110-130 mg/dl. An ACE inhibitor/angiotensin II receptor blocker is being taken. Eye exam is not current (04/2014).  Hypertension This is a chronic problem. The current episode started more than 1 year ago. The problem has been resolved since onset. The problem is controlled. Associated symptoms include peripheral edema and shortness of breath ("when walking"). Pertinent negatives include no blurred vision, chest pain, headaches or palpitations. Risk factors for coronary artery disease include dyslipidemia, diabetes mellitus, family history, obesity, sedentary lifestyle and post-menopausal state. Past treatments include ACE inhibitors and beta blockers. The current treatment provides moderate improvement.  Hypertensive end-organ damage includes a thyroid problem. There is no history of kidney disease, CAD/MI, CVA, heart failure or PVD.  Hyperlipidemia This is a chronic problem. The current episode started more than 1 year ago. The problem is controlled. Recent lipid tests were reviewed and are normal. Exacerbating diseases include diabetes and hypothyroidism. Factors aggravating her hyperlipidemia include smoking and fatty foods. Associated symptoms include shortness of breath ("when walking"). Pertinent negatives include no chest pain, leg pain or myalgias. Current antihyperlipidemic treatment includes statins. Risk factors for coronary artery disease include diabetes mellitus, dyslipidemia, hypertension, obesity, post-menopausal and family history.  Thyroid Problem Presents for follow-up visit. Symptoms include diarrhea and hair loss. Patient reports no anxiety, constipation, depressed mood, dry skin, hoarse voice, leg swelling, nail problem, palpitations or visual change. The symptoms have been stable. Past treatments include levothyroxine. The treatment provided significant relief. Her past medical history is significant for diabetes and hyperlipidemia. There is no history of heart failure.  Gastroesophageal Reflux She reports no belching, no chest pain, no coughing, no globus sensation, no heartburn, no hoarse voice or no sore throat. This is a chronic problem. The current episode started more than 1 year ago. The problem occurs rarely. The problem has been resolved. The symptoms are aggravated by lying down and certain foods. Risk factors include obesity. She has tried a PPI for the symptoms. The treatment provided moderate relief.  Peripheral Edema PT currently talking lasix 20 mg daily, wears compression hose daily, use full SCD's for one hour day. PT states her edema is improved since starting these.     Review of Systems  Constitutional: Negative.   HENT: Negative.  Negative for hoarse voice  and sore throat.   Eyes: Negative.  Negative for blurred vision.  Respiratory: Positive for shortness of breath ("when walking"). Negative for cough.   Cardiovascular: Negative.  Negative for chest pain and palpitations.  Gastrointestinal: Positive   for diarrhea. Negative for heartburn and constipation.  Endocrine: Negative.   Genitourinary: Negative.   Musculoskeletal: Negative.  Negative for myalgias.  Neurological: Negative.  Negative for dizziness and headaches.  Hematological: Negative.   Psychiatric/Behavioral: Negative.  Negative for confusion. The patient is not nervous/anxious.   All other systems reviewed and are negative.      Objective:   Physical Exam  Constitutional: She is oriented to person, place, and time. She appears well-developed and well-nourished. No distress.  Morbid obese  HENT:  Head: Normocephalic and atraumatic.  Right Ear: External ear normal.  Left Ear: External ear normal.  Nose: Nose normal.  Mouth/Throat: Oropharynx is clear and moist.  Eyes: Pupils are equal, round, and reactive to light.  Neck: Normal range of motion. Neck supple. No thyromegaly present.  Cardiovascular: Normal rate, regular rhythm, normal heart sounds and intact distal pulses.   No murmur heard. Pulmonary/Chest: Effort normal and breath sounds normal. No respiratory distress. She has no wheezes.  Abdominal: Soft. Bowel sounds are normal. She exhibits no distension. There is no tenderness.  Musculoskeletal: Normal range of motion. She exhibits no edema or tenderness.  Bilateral unna boots present, pt present in wheelchair  Neurological: She is alert and oriented to person, place, and time. She has normal reflexes. No cranial nerve deficit.  Skin: Skin is warm and dry.  Psychiatric: She has a normal mood and affect. Her behavior is normal. Judgment and thought content normal.  Vitals reviewed.     BP 121/74 mmHg  Pulse 74  Temp(Src) 97.9 F (36.6 C) (Oral)  Ht 5' 3" (1.6  m)  Wt 299 lb (135.626 kg)  BMI 52.98 kg/m2  LMP 10/24/2001     Assessment & Plan:  1. Essential hypertension, benign - CMP14+EGFR  2. COPD exacerbation ([place]) - CMP14+EGFR  3. Gastroesophageal reflux disease, esophagitis presence not specified - CMP14+EGFR  4. Hyperlipidemia - CMP14+EGFR - Lipid panel  5. Morbid obesity, unspecified obesity type ([place]) - CMP14+EGFR  6. Arthritis - CMP14+EGFR - traMADol (ULTRAM) 50 MG tablet; TAKE (2) TABLETS EVERY SIX HOURS AS NEEDED.  Dispense: 180 tablet; Refill: 0 - traMADol (ULTRAM) 50 MG tablet; Take 2 tablets (100 mg total) by mouth every 8 (eight) hours as needed. Do not fill unitl 08/08/15  Dispense: 180 tablet; Refill: 0 - traMADol (ULTRAM) 50 MG tablet; Take 2 tablets (100 mg total) by mouth every 8 (eight) hours as needed.  Dispense: 180 tablet; Refill: 0  7. Type 2 diabetes mellitus with hyperglycemia, without long-term current use of insulin (HCC) - POCT glycosylated hemoglobin (Hb A1C) - CMP14+EGFR - gabapentin (NEURONTIN) 400 MG capsule; Take 1 capsule (400 mg total) by mouth 3 (three) times daily.  Dispense: 270 capsule; Refill: 0  8. Hypothyroidism, unspecified hypothyroidism type - CMP14+EGFR - Thyroid Panel With TSH  9. Vitamin D deficiency - CMP14+EGFR - VITAMIN D 25 Hydroxy (Vit-D Deficiency, Fractures)  10. Peripheral edema - CMP14+EGFR  11. Diabetic peripheral neuropathy (HCC) - gabapentin (NEURONTIN) 400 MG capsule; Take 1 capsule (400 mg total) by mouth 3 (three) times daily.  Dispense: 270 capsule; Refill: 0 - traMADol (ULTRAM) 50 MG tablet; TAKE (2) TABLETS EVERY SIX HOURS AS NEEDED.  Dispense: 180 tablet; Refill: 0 - traMADol (ULTRAM) 50 MG tablet; Take 2 tablets (100 mg total) by mouth every 8 (eight) hours as needed. Do not fill unitl 08/08/15  Dispense: 180 tablet; Refill: 0 - traMADol (ULTRAM) 50 MG tablet; Take 2 tablets (100 mg total) by mouth every 8 (  eight) hours as needed.  Dispense: 180 tablet;  Refill: 0   Continue all meds Labs pending Health Maintenance reviewed Diet and exercise encouraged RTO 3 months  Evelina Dun, FNP

## 2015-07-08 NOTE — Patient Instructions (Signed)
Health Maintenance, Female Adopting a healthy lifestyle and getting preventive care can go a long way to promote health and wellness. Talk with your health care provider about what schedule of regular examinations is right for you. This is a good chance for you to check in with your provider about disease prevention and staying healthy. In between checkups, there are plenty of things you can do on your own. Experts have done a lot of research about which lifestyle changes and preventive measures are most likely to keep you healthy. Ask your health care provider for more information. WEIGHT AND DIET  Eat a healthy diet  Be sure to include plenty of vegetables, fruits, low-fat dairy products, and lean protein.  Do not eat a lot of foods high in solid fats, added sugars, or salt.  Get regular exercise. This is one of the most important things you can do for your health.  Most adults should exercise for at least 150 minutes each week. The exercise should increase your heart rate and make you sweat (moderate-intensity exercise).  Most adults should also do strengthening exercises at least twice a week. This is in addition to the moderate-intensity exercise.  Maintain a healthy weight  Body mass index (BMI) is a measurement that can be used to identify possible weight problems. It estimates body fat based on height and weight. Your health care provider can help determine your BMI and help you achieve or maintain a healthy weight.  For females 20 years of age and older:   A BMI below 18.5 is considered underweight.  A BMI of 18.5 to 24.9 is normal.  A BMI of 25 to 29.9 is considered overweight.  A BMI of 30 and above is considered obese.  Watch levels of cholesterol and blood lipids  You should start having your blood tested for lipids and cholesterol at 65 years of age, then have this test every 5 years.  You may need to have your cholesterol levels checked more often if:  Your lipid  or cholesterol levels are high.  You are older than 65 years of age.  You are at high risk for heart disease.  CANCER SCREENING   Lung Cancer  Lung cancer screening is recommended for adults 55-80 years old who are at high risk for lung cancer because of a history of smoking.  A yearly low-dose CT scan of the lungs is recommended for people who:  Currently smoke.  Have quit within the past 15 years.  Have at least a 30-pack-year history of smoking. A pack year is smoking an average of one pack of cigarettes a day for 1 year.  Yearly screening should continue until it has been 15 years since you quit.  Yearly screening should stop if you develop a health problem that would prevent you from having lung cancer treatment.  Breast Cancer  Practice breast self-awareness. This means understanding how your breasts normally appear and feel.  It also means doing regular breast self-exams. Let your health care provider know about any changes, no matter how small.  If you are in your 20s or 30s, you should have a clinical breast exam (CBE) by a health care provider every 1-3 years as part of a regular health exam.  If you are 40 or older, have a CBE every year. Also consider having a breast X-ray (mammogram) every year.  If you have a family history of breast cancer, talk to your health care provider about genetic screening.  If you   are at high risk for breast cancer, talk to your health care provider about having an MRI and a mammogram every year.  Breast cancer gene (BRCA) assessment is recommended for women who have family members with BRCA-related cancers. BRCA-related cancers include:  Breast.  Ovarian.  Tubal.  Peritoneal cancers.  Results of the assessment will determine the need for genetic counseling and BRCA1 and BRCA2 testing. Cervical Cancer Your health care provider may recommend that you be screened regularly for cancer of the pelvic organs (ovaries, uterus, and  vagina). This screening involves a pelvic examination, including checking for microscopic changes to the surface of your cervix (Pap test). You may be encouraged to have this screening done every 3 years, beginning at age 21.  For women ages 30-65, health care providers may recommend pelvic exams and Pap testing every 3 years, or they may recommend the Pap and pelvic exam, combined with testing for human papilloma virus (HPV), every 5 years. Some types of HPV increase your risk of cervical cancer. Testing for HPV may also be done on women of any age with unclear Pap test results.  Other health care providers may not recommend any screening for nonpregnant women who are considered low risk for pelvic cancer and who do not have symptoms. Ask your health care provider if a screening pelvic exam is right for you.  If you have had past treatment for cervical cancer or a condition that could lead to cancer, you need Pap tests and screening for cancer for at least 20 years after your treatment. If Pap tests have been discontinued, your risk factors (such as having a new sexual partner) need to be reassessed to determine if screening should resume. Some women have medical problems that increase the chance of getting cervical cancer. In these cases, your health care provider may recommend more frequent screening and Pap tests. Colorectal Cancer  This type of cancer can be detected and often prevented.  Routine colorectal cancer screening usually begins at 65 years of age and continues through 65 years of age.  Your health care provider may recommend screening at an earlier age if you have risk factors for colon cancer.  Your health care provider may also recommend using home test kits to check for hidden blood in the stool.  A small camera at the end of a tube can be used to examine your colon directly (sigmoidoscopy or colonoscopy). This is done to check for the earliest forms of colorectal  cancer.  Routine screening usually begins at age 50.  Direct examination of the colon should be repeated every 5-10 years through 65 years of age. However, you may need to be screened more often if early forms of precancerous polyps or small growths are found. Skin Cancer  Check your skin from head to toe regularly.  Tell your health care provider about any new moles or changes in moles, especially if there is a change in a mole's shape or color.  Also tell your health care provider if you have a mole that is larger than the size of a pencil eraser.  Always use sunscreen. Apply sunscreen liberally and repeatedly throughout the day.  Protect yourself by wearing long sleeves, pants, a wide-brimmed hat, and sunglasses whenever you are outside. HEART DISEASE, DIABETES, AND HIGH BLOOD PRESSURE   High blood pressure causes heart disease and increases the risk of stroke. High blood pressure is more likely to develop in:  People who have blood pressure in the high end   of the normal range (130-139/85-89 mm Hg).  People who are overweight or obese.  People who are African American.  If you are 38-23 years of age, have your blood pressure checked every 3-5 years. If you are 61 years of age or older, have your blood pressure checked every year. You should have your blood pressure measured twice--once when you are at a hospital or clinic, and once when you are not at a hospital or clinic. Record the average of the two measurements. To check your blood pressure when you are not at a hospital or clinic, you can use:  An automated blood pressure machine at a pharmacy.  A home blood pressure monitor.  If you are between 45 years and 39 years old, ask your health care provider if you should take aspirin to prevent strokes.  Have regular diabetes screenings. This involves taking a blood sample to check your fasting blood sugar level.  If you are at a normal weight and have a low risk for diabetes,  have this test once every three years after 65 years of age.  If you are overweight and have a high risk for diabetes, consider being tested at a younger age or more often. PREVENTING INFECTION  Hepatitis B  If you have a higher risk for hepatitis B, you should be screened for this virus. You are considered at high risk for hepatitis B if:  You were born in a country where hepatitis B is common. Ask your health care provider which countries are considered high risk.  Your parents were born in a high-risk country, and you have not been immunized against hepatitis B (hepatitis B vaccine).  You have HIV or AIDS.  You use needles to inject street drugs.  You live with someone who has hepatitis B.  You have had sex with someone who has hepatitis B.  You get hemodialysis treatment.  You take certain medicines for conditions, including cancer, organ transplantation, and autoimmune conditions. Hepatitis C  Blood testing is recommended for:  Everyone born from [birth year] through 1966.  Anyone with known risk factors for hepatitis C. Sexually transmitted infections (STIs)  You should be screened for sexually transmitted infections (STIs) including gonorrhea and chlamydia if:  You are sexually active and are younger than 65 years of age.  You are older than 65 years of age and your health care provider tells you that you are at risk for this type of infection.  Your sexual activity has changed since you were last screened and you are at an increased risk for chlamydia or gonorrhea. Ask your health care provider if you are at risk.  If you do not have HIV, but are at risk, it may be recommended that you take a prescription medicine daily to prevent HIV infection. This is called pre-exposure prophylaxis (PrEP). You are considered at risk if:  You are sexually active and do not regularly use condoms or know the HIV status of your partner(s).  You take drugs by injection.  You are sexually  active with a partner who has HIV. Talk with your health care provider about whether you are at high risk of being infected with HIV. If you choose to begin PrEP, you should first be tested for HIV. You should then be tested every 3 months for as long as you are taking PrEP.  PREGNANCY   If you are premenopausal and you may become pregnant, ask your health care provider about preconception counseling.  If you may  become pregnant, take 400 to 800 micrograms (mcg) of folic acid every day.  If you want to prevent pregnancy, talk to your health care provider about birth control (contraception). OSTEOPOROSIS AND MENOPAUSE   Osteoporosis is a disease in which the bones lose minerals and strength with aging. This can result in serious bone fractures. Your risk for osteoporosis can be identified using a bone density scan.  If you are 84 years of age or older, or if you are at risk for osteoporosis and fractures, ask your health care provider if you should be screened.  Ask your health care provider whether you should take a calcium or vitamin D supplement to lower your risk for osteoporosis.  Menopause may have certain physical symptoms and risks.  Hormone replacement therapy may reduce some of these symptoms and risks. Talk to your health care provider about whether hormone replacement therapy is right for you.  HOME CARE INSTRUCTIONS   Schedule regular health, dental, and eye exams.  Stay current with your immunizations.   Do not use any tobacco products including cigarettes, chewing tobacco, or electronic cigarettes.  If you are pregnant, do not drink alcohol.  If you are breastfeeding, limit how much and how often you drink alcohol.  Limit alcohol intake to no more than 1 drink per day for nonpregnant women. One drink equals 12 ounces of beer, 5 ounces of wine, or 1 ounces of hard liquor.  Do not use street drugs.  Do not share needles.  Ask your health care provider for help if  you need support or information about quitting drugs.  Tell your health care provider if you often feel depressed.  Tell your health care provider if you have ever been abused or do not feel safe at home.   This information is not intended to replace advice given to you by your health care provider. Make sure you discuss any questions you have with your health care provider.   Document Released: 01/17/2011 Document Revised: 07/25/2014 Document Reviewed: 06/05/2013 Elsevier Interactive Patient Education 2016 [employer] Peripheral Neuropathy Peripheral neuropathy is a type of nerve damage. It affects nerves that carry signals between the spinal cord and other parts of the body. These are called peripheral nerves. With peripheral neuropathy, one nerve or a group of nerves may be damaged.  CAUSES  Many things can damage peripheral nerves. For some people with peripheral neuropathy, the cause is unknown. Some causes include:  Diabetes. This is the most common cause of peripheral neuropathy.  Injury to a nerve.  Pressure or stress on a nerve that lasts a long time.  Too little vitamin B. Alcoholism can lead to this.  Infections.  Autoimmune diseases, such as multiple sclerosis and systemic lupus erythematosus.  Inherited nerve diseases.  Some medicines, such as cancer drugs.  Toxic substances, such as lead and mercury.  Too little blood flowing to the legs.  Kidney disease.  Thyroid disease. SIGNS AND SYMPTOMS  Different people have different symptoms. The symptoms you have will depend on which of your nerves is damaged. Common symptoms include:  Loss of feeling (numbness) in the feet and hands.  Tingling in the feet and hands.  Pain that burns.  Very sensitive skin.  Weakness.  Not being able to move a part of the body (paralysis).  Muscle twitching.  Clumsiness or poor coordination.  Loss of balance.  Not being able to control your bladder.  Feeling  dizzy.  Sexual problems. DIAGNOSIS  Peripheral neuropathy is a  symptom, not a disease. Finding the cause of peripheral neuropathy can be hard. To figure that out, your health care provider will take a medical history and do a physical exam. A neurological exam will also be done. This involves checking things affected by your brain, spinal cord, and nerves (nervous system). For example, your health care provider will check your reflexes, how you move, and what you can feel.  Other types of tests may also be ordered, such as:  Blood tests.  A test of the fluid in your spinal cord.  Imaging tests, such as CT scans or an MRI.  Electromyography (EMG). This test checks the nerves that control muscles.  Nerve conduction velocity tests. These tests check how fast messages pass through your nerves.  Nerve biopsy. A small piece of nerve is removed. It is then checked under a microscope. TREATMENT   Medicine is often used to treat peripheral neuropathy. Medicines may include:  Pain-relieving medicines. Prescription or over-the-counter medicine may be suggested.  Antiseizure medicine. This may be used for pain.  Antidepressants. These also may help ease pain from neuropathy.  Lidocaine. This is a numbing medicine. You might wear a patch or be given a shot.  Mexiletine. This medicine is typically used to help control irregular heart rhythms.  Surgery. Surgery may be needed to relieve pressure on a nerve or to destroy a nerve that is causing pain.  Physical therapy to help movement.  Assistive devices to help movement. HOME CARE INSTRUCTIONS   Only take over-the-counter or prescription medicines as directed by your health care provider. Follow the instructions carefully for any given medicines. Do not take any other medicines without first getting approval from your health care provider.  If you have diabetes, work closely with your health care provider to keep your blood sugar under  control.  If you have numbness in your feet:  Check every day for signs of injury or infection. Watch for redness, warmth, and swelling.  Wear padded socks and comfortable shoes. These help protect your feet.  Do not do things that put pressure on your damaged nerve.  Do not smoke. Smoking keeps blood from getting to damaged nerves.  Avoid or limit alcohol. Too much alcohol can cause a lack of B vitamins. These vitamins are needed for healthy nerves.  Develop a good support system. Coping with peripheral neuropathy can be stressful. Talk to a mental health specialist or join a support group if you are struggling.  Follow up with your health care provider as directed. SEEK MEDICAL CARE IF:   You have new signs or symptoms of peripheral neuropathy.  You are struggling emotionally from dealing with peripheral neuropathy.  You have a fever. SEEK IMMEDIATE MEDICAL CARE IF:   You have an injury or infection that is not healing.  You feel very dizzy or begin vomiting.  You have chest pain.  You have trouble breathing.   This information is not intended to replace advice given to you by your health care provider. Make sure you discuss any questions you have with your health care provider.   Document Released: 06/24/2002 Document Revised: 03/16/2011 Document Reviewed: 03/11/2013 Elsevier Interactive Patient Education [employer].

## 2015-07-08 NOTE — Addendum Note (Signed)
Addended by: Orma RenderHODGES, Ambers Iyengar F on: 07/08/2015 11:36 AM   Modules accepted: K[employer] BroodSmartSet

## 2015-07-08 NOTE — Addendum Note (Signed)
Addended by: Jannifer RodneyHAWKS, Aleya Durnell A on: 07/08/2015 11:27 AM   Modules accepted: Orders

## 2015-07-09 LAB — CMP14+EGFR
ALT: 6 IU/L (ref 0–32)
AST: 6 IU/L (ref 0–40)
Albumin/Globulin Ratio: 1 — ABNORMAL LOW (ref 1.1–2.5)
Albumin: 3.4 g/dL — ABNORMAL LOW (ref 3.6–4.8)
Alkaline Phosphatase: 132 IU/L — ABNORMAL HIGH (ref 39–117)
BUN/Creatinine Ratio: 16 (ref 11–26)
BUN: 13 mg/dL (ref 8–27)
Bilirubin Total: 0.3 mg/dL (ref 0.0–1.2)
CALCIUM: 9.3 mg/dL (ref 8.7–10.3)
CO2: 29 mmol/L (ref 18–29)
CREATININE: 0.8 mg/dL (ref 0.57–1.00)
Chloride: 97 mmol/L (ref 96–106)
GFR, EST AFRICAN AMERICAN: 89 mL/min/{1.73_m2} (ref >59)
GFR, EST NON AFRICAN AMERICAN: 78 mL/min/{1.73_m2} (ref >59)
Globulin, Total: 3.5 g/dL (ref 1.5–4.5)
Glucose: 83 mg/dL (ref 65–99)
Potassium: 4.8 mmol/L (ref 3.5–5.2)
Sodium: 140 mmol/L (ref 134–144)
TOTAL PROTEIN: 6.9 g/dL (ref 6.0–8.5)

## 2015-07-09 LAB — THYROID PANEL WITH TSH
FREE THYROXINE INDEX: 2.4 (ref 1.2–4.9)
T3 UPTAKE RATIO: 24 % (ref 24–39)
T4 TOTAL: 9.8 ug/dL (ref 4.5–12.0)
TSH: 3.28 u[IU]/mL (ref 0.450–4.500)

## 2015-07-09 LAB — LIPID PANEL
CHOL/HDL RATIO: 2.8 ratio (ref 0.0–4.4)
Cholesterol, Total: 131 mg/dL (ref 100–199)
HDL: 47 mg/dL (ref >39)
LDL CALC: 51 mg/dL (ref 0–99)
TRIGLYCERIDES: 164 mg/dL — AB (ref 0–149)
VLDL CHOLESTEROL CAL: 33 mg/dL (ref 5–40)

## 2015-07-09 LAB — VITAMIN D 25 HYDROXY (VIT D DEFICIENCY, FRACTURES): Vit D, 25-Hydroxy: 33.9 ng/mL (ref 30.0–100.0)

## 2015-08-05 ENCOUNTER — Telehealth: Payer: Self-pay | Admitting: Family

## 2015-08-05 NOTE — Telephone Encounter (Signed)
Labs mailed to patient. DOS 04-07-15-,04-09-15,07-08-15

## 2015-08-25 ENCOUNTER — Encounter: Payer: Self-pay | Admitting: Family

## 2015-08-25 ENCOUNTER — Other Ambulatory Visit: Payer: Self-pay

## 2015-08-25 DIAGNOSIS — E1165 Type 2 diabetes mellitus with hyperglycemia: Secondary | ICD-10-CM

## 2015-08-25 DIAGNOSIS — E1142 Type 2 diabetes mellitus with diabetic polyneuropathy: Secondary | ICD-10-CM

## 2015-08-25 MED ORDER — GABAPENTIN 400 MG PO CAPS: 400.0000 mg | ORAL_CAPSULE | Freq: Three times a day (TID) | ORAL | Status: DC

## 2015-08-25 MED ORDER — GLIMEPIRIDE 4 MG PO TABS: ORAL_TABLET | ORAL | Status: DC

## 2015-08-28 ENCOUNTER — Other Ambulatory Visit: Payer: Self-pay | Admitting: *Deleted

## 2015-08-28 DIAGNOSIS — E1165 Type 2 diabetes mellitus with hyperglycemia: Secondary | ICD-10-CM

## 2015-08-28 DIAGNOSIS — E1142 Type 2 diabetes mellitus with diabetic polyneuropathy: Secondary | ICD-10-CM

## 2015-08-28 MED ORDER — GABAPENTIN 400 MG PO CAPS: 400.0000 mg | ORAL_CAPSULE | Freq: Three times a day (TID) | ORAL | Status: DC

## 2015-09-17 ENCOUNTER — Other Ambulatory Visit: Payer: Self-pay | Admitting: Family

## 2015-09-21 ENCOUNTER — Other Ambulatory Visit: Payer: Self-pay | Admitting: *Deleted

## 2015-09-21 DIAGNOSIS — E039 Hypothyroidism, unspecified: Secondary | ICD-10-CM

## 2015-09-21 MED ORDER — GLIMEPIRIDE 4 MG PO TABS: ORAL_TABLET | ORAL | Status: DC

## 2015-09-21 MED ORDER — LEVOTHYROXINE SODIUM 50 MCG PO TABS: 50.0000 ug | ORAL_TABLET | Freq: Every day | ORAL | Status: DC

## 2015-10-06 ENCOUNTER — Other Ambulatory Visit: Payer: Self-pay | Admitting: Family

## 2015-10-06 NOTE — Telephone Encounter (Signed)
RX ready for pick up 

## 2015-10-06 NOTE — Telephone Encounter (Signed)
Patient aware that prescription placed at front desk for pickup

## 2015-10-12 ENCOUNTER — Ambulatory Visit (INDEPENDENT_AMBULATORY_CARE_PROVIDER_SITE_OTHER): Payer: BLUE CROSS/BLUE SHIELD | Admitting: Family

## 2015-10-12 ENCOUNTER — Encounter: Payer: Self-pay | Admitting: Family

## 2015-10-12 VITALS — BP 128/67 | HR 71 | Temp 98.1°F | Ht 63.0 in | Wt 305.2 lb

## 2015-10-12 DIAGNOSIS — E1142 Type 2 diabetes mellitus with diabetic polyneuropathy: Secondary | ICD-10-CM | POA: Diagnosis not present

## 2015-10-12 DIAGNOSIS — J441 Chronic obstructive pulmonary disease with (acute) exacerbation: Secondary | ICD-10-CM | POA: Diagnosis not present

## 2015-10-12 DIAGNOSIS — R609 Edema, unspecified: Secondary | ICD-10-CM

## 2015-10-12 DIAGNOSIS — E039 Hypothyroidism, unspecified: Secondary | ICD-10-CM

## 2015-10-12 DIAGNOSIS — E559 Vitamin D deficiency, unspecified: Secondary | ICD-10-CM

## 2015-10-12 DIAGNOSIS — I10 Essential (primary) hypertension: Secondary | ICD-10-CM

## 2015-10-12 DIAGNOSIS — M1990 Unspecified osteoarthritis, unspecified site: Secondary | ICD-10-CM | POA: Diagnosis not present

## 2015-10-12 DIAGNOSIS — K219 Gastro-esophageal reflux disease without esophagitis: Secondary | ICD-10-CM | POA: Diagnosis not present

## 2015-10-12 DIAGNOSIS — E785 Hyperlipidemia, unspecified: Secondary | ICD-10-CM

## 2015-10-12 DIAGNOSIS — E1165 Type 2 diabetes mellitus with hyperglycemia: Secondary | ICD-10-CM

## 2015-10-12 LAB — BAYER DCA HB A1C WAIVED: HB A1C: 7 % — AB (ref <7.0)

## 2015-10-12 MED ORDER — TRAMADOL HCL 50 MG PO TABS: 100.0000 mg | ORAL_TABLET | Freq: Three times a day (TID) | ORAL | Status: DC | PRN

## 2015-10-12 MED ORDER — CARVEDILOL 12.5 MG PO TABS: 12.5000 mg | ORAL_TABLET | Freq: Every day | ORAL | Status: DC

## 2015-10-12 MED ORDER — TRAMADOL HCL 50 MG PO TABS: ORAL_TABLET | ORAL | Status: DC

## 2015-10-12 NOTE — Patient Instructions (Signed)
Health Maintenance, Female Adopting a healthy lifestyle and getting preventive care can go a long way to promote health and wellness. Talk with your health care provider about what schedule of regular examinations is right for you. This is a good chance for you to check in with your provider about disease prevention and staying healthy. In between checkups, there are plenty of things you can do on your own. Experts have done a lot of research about which lifestyle changes and preventive measures are most likely to keep you healthy. Ask your health care provider for more information. WEIGHT AND DIET  Eat a healthy diet  Be sure to include plenty of vegetables, fruits, low-fat dairy products, and lean protein.  Do not eat a lot of foods high in solid fats, added sugars, or salt.  Get regular exercise. This is one of the most important things you can do for your health.  Most adults should exercise for at least 150 minutes each week. The exercise should increase your heart rate and make you sweat (moderate-intensity exercise).  Most adults should also do strengthening exercises at least twice a week. This is in addition to the moderate-intensity exercise.  Maintain a healthy weight  Body mass index (BMI) is a measurement that can be used to identify possible weight problems. It estimates body fat based on height and weight. Your health care provider can help determine your BMI and help you achieve or maintain a healthy weight.  For females 20 years of age and older:   A BMI below 18.5 is considered underweight.  A BMI of 18.5 to 24.9 is normal.  A BMI of 25 to 29.9 is considered overweight.  A BMI of 30 and above is considered obese.  Watch levels of cholesterol and blood lipids  You should start having your blood tested for lipids and cholesterol at 66 years of age, then have this test every 5 years.  You may need to have your cholesterol levels checked more often if:  Your lipid  or cholesterol levels are high.  You are older than 66 years of age.  You are at high risk for heart disease.  CANCER SCREENING   Lung Cancer  Lung cancer screening is recommended for adults 55-80 years old who are at high risk for lung cancer because of a history of smoking.  A yearly low-dose CT scan of the lungs is recommended for people who:  Currently smoke.  Have quit within the past 15 years.  Have at least a 30-pack-year history of smoking. A pack year is smoking an average of one pack of cigarettes a day for 1 year.  Yearly screening should continue until it has been 15 years since you quit.  Yearly screening should stop if you develop a health problem that would prevent you from having lung cancer treatment.  Breast Cancer  Practice breast self-awareness. This means understanding how your breasts normally appear and feel.  It also means doing regular breast self-exams. Let your health care provider know about any changes, no matter how small.  If you are in your 20s or 30s, you should have a clinical breast exam (CBE) by a health care provider every 1-3 years as part of a regular health exam.  If you are 40 or older, have a CBE every year. Also consider having a breast X-ray (mammogram) every year.  If you have a family history of breast cancer, talk to your health care provider about genetic screening.  If you   are at high risk for breast cancer, talk to your health care provider about having an MRI and a mammogram every year.  Breast cancer gene (BRCA) assessment is recommended for women who have family members with BRCA-related cancers. BRCA-related cancers include:  Breast.  Ovarian.  Tubal.  Peritoneal cancers.  Results of the assessment will determine the need for genetic counseling and BRCA1 and BRCA2 testing. Cervical Cancer Your health care provider may recommend that you be screened regularly for cancer of the pelvic organs (ovaries, uterus, and  vagina). This screening involves a pelvic examination, including checking for microscopic changes to the surface of your cervix (Pap test). You may be encouraged to have this screening done every 3 years, beginning at age 21.  For women ages 30-65, health care providers may recommend pelvic exams and Pap testing every 3 years, or they may recommend the Pap and pelvic exam, combined with testing for human papilloma virus (HPV), every 5 years. Some types of HPV increase your risk of cervical cancer. Testing for HPV may also be done on women of any age with unclear Pap test results.  Other health care providers may not recommend any screening for nonpregnant women who are considered low risk for pelvic cancer and who do not have symptoms. Ask your health care provider if a screening pelvic exam is right for you.  If you have had past treatment for cervical cancer or a condition that could lead to cancer, you need Pap tests and screening for cancer for at least 20 years after your treatment. If Pap tests have been discontinued, your risk factors (such as having a new sexual partner) need to be reassessed to determine if screening should resume. Some women have medical problems that increase the chance of getting cervical cancer. In these cases, your health care provider may recommend more frequent screening and Pap tests. Colorectal Cancer  This type of cancer can be detected and often prevented.  Routine colorectal cancer screening usually begins at 66 years of age and continues through 66 years of age.  Your health care provider may recommend screening at an earlier age if you have risk factors for colon cancer.  Your health care provider may also recommend using home test kits to check for hidden blood in the stool.  A small camera at the end of a tube can be used to examine your colon directly (sigmoidoscopy or colonoscopy). This is done to check for the earliest forms of colorectal  cancer.  Routine screening usually begins at age 50.  Direct examination of the colon should be repeated every 5-10 years through 66 years of age. However, you may need to be screened more often if early forms of precancerous polyps or small growths are found. Skin Cancer  Check your skin from head to toe regularly.  Tell your health care provider about any new moles or changes in moles, especially if there is a change in a mole's shape or color.  Also tell your health care provider if you have a mole that is larger than the size of a pencil eraser.  Always use sunscreen. Apply sunscreen liberally and repeatedly throughout the day.  Protect yourself by wearing long sleeves, pants, a wide-brimmed hat, and sunglasses whenever you are outside. HEART DISEASE, DIABETES, AND HIGH BLOOD PRESSURE   High blood pressure causes heart disease and increases the risk of stroke. High blood pressure is more likely to develop in:  People who have blood pressure in the high end   of the normal range (130-139/85-89 mm Hg).  People who are overweight or obese.  People who are African American.  If you are 38-23 years of age, have your blood pressure checked every 3-5 years. If you are 61 years of age or older, have your blood pressure checked every year. You should have your blood pressure measured twice--once when you are at a hospital or clinic, and once when you are not at a hospital or clinic. Record the average of the two measurements. To check your blood pressure when you are not at a hospital or clinic, you can use:  An automated blood pressure machine at a pharmacy.  A home blood pressure monitor.  If you are between 45 years and 39 years old, ask your health care provider if you should take aspirin to prevent strokes.  Have regular diabetes screenings. This involves taking a blood sample to check your fasting blood sugar level.  If you are at a normal weight and have a low risk for diabetes,  have this test once every three years after 66 years of age.  If you are overweight and have a high risk for diabetes, consider being tested at a younger age or more often. PREVENTING INFECTION  Hepatitis B  If you have a higher risk for hepatitis B, you should be screened for this virus. You are considered at high risk for hepatitis B if:  You were born in a country where hepatitis B is common. Ask your health care provider which countries are considered high risk.  Your parents were born in a high-risk country, and you have not been immunized against hepatitis B (hepatitis B vaccine).  You have HIV or AIDS.  You use needles to inject street drugs.  You live with someone who has hepatitis B.  You have had sex with someone who has hepatitis B.  You get hemodialysis treatment.  You take certain medicines for conditions, including cancer, organ transplantation, and autoimmune conditions. Hepatitis C  Blood testing is recommended for:  Everyone born from [birth year] through 1966.  Anyone with known risk factors for hepatitis C. Sexually transmitted infections (STIs)  You should be screened for sexually transmitted infections (STIs) including gonorrhea and chlamydia if:  You are sexually active and are younger than 66 years of age.  You are older than 66 years of age and your health care provider tells you that you are at risk for this type of infection.  Your sexual activity has changed since you were last screened and you are at an increased risk for chlamydia or gonorrhea. Ask your health care provider if you are at risk.  If you do not have HIV, but are at risk, it may be recommended that you take a prescription medicine daily to prevent HIV infection. This is called pre-exposure prophylaxis (PrEP). You are considered at risk if:  You are sexually active and do not regularly use condoms or know the HIV status of your partner(s).  You take drugs by injection.  You are sexually  active with a partner who has HIV. Talk with your health care provider about whether you are at high risk of being infected with HIV. If you choose to begin PrEP, you should first be tested for HIV. You should then be tested every 3 months for as long as you are taking PrEP.  PREGNANCY   If you are premenopausal and you may become pregnant, ask your health care provider about preconception counseling.  If you may  become pregnant, take 400 to 800 micrograms (mcg) of folic acid every day.  If you want to prevent pregnancy, talk to your health care provider about birth control (contraception). OSTEOPOROSIS AND MENOPAUSE   Osteoporosis is a disease in which the bones lose minerals and strength with aging. This can result in serious bone fractures. Your risk for osteoporosis can be identified using a bone density scan.  If you are 61 years of age or older, or if you are at risk for osteoporosis and fractures, ask your health care provider if you should be screened.  Ask your health care provider whether you should take a calcium or vitamin D supplement to lower your risk for osteoporosis.  Menopause may have certain physical symptoms and risks.  Hormone replacement therapy may reduce some of these symptoms and risks. Talk to your health care provider about whether hormone replacement therapy is right for you.  HOME CARE INSTRUCTIONS   Schedule regular health, dental, and eye exams.  Stay current with your immunizations.   Do not use any tobacco products including cigarettes, chewing tobacco, or electronic cigarettes.  If you are pregnant, do not drink alcohol.  If you are breastfeeding, limit how much and how often you drink alcohol.  Limit alcohol intake to no more than 1 drink per day for nonpregnant women. One drink equals 12 ounces of beer, 5 ounces of wine, or 1 ounces of hard liquor.  Do not use street drugs.  Do not share needles.  Ask your health care provider for help if  you need support or information about quitting drugs.  Tell your health care provider if you often feel depressed.  Tell your health care provider if you have ever been abused or do not feel safe at home.   This information is not intended to replace advice given to you by your health care provider. Make sure you discuss any questions you have with your health care provider.   Document Released: 01/17/2011 Document Revised: 07/25/2014 Document Reviewed: 06/05/2013 Elsevier Interactive Patient Education [employer].

## 2015-10-12 NOTE — Addendum Note (Signed)
Addended by: Jannifer RodneyHAWKS, Chestine Belknap A on: 10/12/2015 11:21 AM   Modules accepted: Orders

## 2015-10-12 NOTE — Progress Notes (Addendum)
Subjective:    Patient ID: Jennifer Rasmussen, female    DOB: 17-May-1951, 65 y.o.   MRN: [id number]  PT presents to the office today for chronic follow up.  Hypertension This is a chronic problem. The current episode started more than 1 year ago. The problem has been resolved since onset. The problem is controlled. Associated symptoms include peripheral edema and shortness of breath ("when walking"). Pertinent negatives include no blurred vision, chest pain, headaches or palpitations. Risk factors for coronary artery disease include dyslipidemia, diabetes mellitus, family history, obesity, sedentary lifestyle and post-menopausal state. Past treatments include ACE inhibitors and beta blockers. The current treatment provides moderate improvement. Hypertensive end-organ damage includes a thyroid problem. There is no history of kidney disease, CAD/MI, CVA, heart failure or PVD.  Diabetes She presents for her follow-up diabetic visit. She has type 2 diabetes mellitus. Her disease course has been stable. Pertinent negatives for hypoglycemia include no confusion, dizziness, headaches, mood changes or nervousness/anxiousness. Pertinent negatives for diabetes include no blurred vision, no chest pain, no foot paresthesias, no foot ulcerations and no visual change. Pertinent negatives for hypoglycemia complications include no blackouts, no hospitalization and no nocturnal hypoglycemia. Symptoms are worsening. Diabetic complications include peripheral neuropathy (in "hands"). Pertinent negatives for diabetic complications include no CVA, heart disease, nephropathy or PVD. Risk factors for coronary artery disease include diabetes mellitus, dyslipidemia, hypertension, obesity, post-menopausal, family history, stress and tobacco exposure. Current diabetic treatment includes oral agent (dual therapy) and oral agent (monotherapy). She is compliant with treatment some of the time. She is following a generally unhealthy diet.  Her breakfast blood glucose range is generally 130-140 mg/dl. An ACE inhibitor/angiotensin II receptor blocker is being taken. Eye exam is not current (Pt educated on importance of getting eye exam).  Hyperlipidemia This is a chronic problem. The current episode started more than 1 year ago. The problem is controlled. Recent lipid tests were reviewed and are normal. Exacerbating diseases include diabetes and hypothyroidism. Factors aggravating her hyperlipidemia include smoking and fatty foods. Associated symptoms include shortness of breath ("when walking"). Pertinent negatives include no chest pain, leg pain or myalgias. Current antihyperlipidemic treatment includes statins. Risk factors for coronary artery disease include diabetes mellitus, dyslipidemia, hypertension, obesity, post-menopausal and family history.  Thyroid Problem Presents for follow-up visit. Symptoms include hair loss. Patient reports no anxiety, constipation, depressed mood, diarrhea, dry skin, hoarse voice, leg swelling, nail problem, palpitations or visual change. The symptoms have been stable. Past treatments include levothyroxine. The treatment provided significant relief. Her past medical history is significant for diabetes and hyperlipidemia. There is no history of heart failure.  Gastroesophageal Reflux She reports no belching, no chest pain, no coughing, no globus sensation, no heartburn, no hoarse voice or no sore throat. This is a chronic problem. The current episode started more than 1 year ago. The problem occurs rarely. The problem has been resolved. The symptoms are aggravated by lying down and certain foods. Risk factors include obesity. She has tried a PPI for the symptoms. The treatment provided moderate relief.  Peripheral Edema PT currently talking lasix 20 mg daily, wears compression hose daily, use full SCD's for one hour day. PT states her edema is improved since starting these.  COPD Pt currently taking Symbicort  BID and uses nebulizer as needed. Pt states if she has to walk any distance she becomes SOB.    Review of Systems  Constitutional: Negative.   HENT: Negative.  Negative for hoarse voice and sore throat.  Eyes: Negative.  Negative for blurred vision.  Respiratory: Positive for shortness of breath ("when walking"). Negative for cough.   Cardiovascular: Negative.  Negative for chest pain and palpitations.  Gastrointestinal: Negative for heartburn, diarrhea and constipation.  Endocrine: Negative.   Genitourinary: Negative.   Musculoskeletal: Negative.  Negative for myalgias.  Neurological: Negative.  Negative for dizziness and headaches.  Hematological: Negative.   Psychiatric/Behavioral: Negative.  Negative for confusion. The patient is not nervous/anxious.   All other systems reviewed and are negative.      Objective:   Physical Exam  Constitutional: She is oriented to person, place, and time. She appears well-developed and well-nourished. No distress.  Morbid obese  HENT:  Head: Normocephalic and atraumatic.  Right Ear: External ear normal.  Left Ear: External ear normal.  Nose: Nose normal.  Mouth/Throat: Oropharynx is clear and moist.  Eyes: Pupils are equal, round, and reactive to light.  Neck: Normal range of motion. Neck supple. No thyromegaly present.  Cardiovascular: Normal rate, regular rhythm, normal heart sounds and intact distal pulses.   No murmur heard. Pulmonary/Chest: Effort normal and breath sounds normal. No respiratory distress. She has no wheezes.  Abdominal: Soft. Bowel sounds are normal. She exhibits no distension. There is no tenderness.  Musculoskeletal: Normal range of motion. She exhibits no edema or tenderness.  Bilateral unna boots present, pt present in wheelchair  Neurological: She is alert and oriented to person, place, and time. She has normal reflexes. No cranial nerve deficit.  Skin: Skin is warm and dry.  Psychiatric: She has a normal mood and  affect. Her behavior is normal. Judgment and thought content normal.  Vitals reviewed.     BP 128/67 mmHg  Pulse 71  Temp(Src) 98.1 F (36.7 C) (Oral)  Ht _0  (1.6 m)  Wt 305 lb 3.2 oz (138.438 kg)  BMI 54.08 kg/m2  LMP 10/24/2001     Assessment & Plan:  1. Essential hypertension, benign - CMP14+EGFR - carvedilol (COREG) 12.5 MG tablet; Take 1 tablet (12.5 mg total) by mouth daily.  Dispense: 90 tablet; Refill: 3  2. COPD exacerbation ([place]) - CMP14+EGFR - DME Wheelchair manual  3. Gastroesophageal reflux disease, esophagitis presence not specified - CMP14+EGFR  4. Type 2 diabetes mellitus with hyperglycemia, without long-term current use of insulin (HCC) - Bayer DCA Hb A1c Waived - CMP14+EGFR - Ambulatory referral to Ophthalmology - DME Wheelchair manual  5. Morbid obesity, unspecified obesity type ([place]) - CMP14+EGFR - DME Wheelchair manual  6. Hypothyroidism, unspecified hypothyroidism type - CMP14+EGFR - Thyroid Panel With TSH  7. Arthritis - CMP14+EGFR - traMADol (ULTRAM) 50 MG tablet; Take 2 tablets (100 mg total) by mouth every 8 (eight) hours as needed. Do not fill unitl 08/08/15  Dispense: 180 tablet; Refill: 0 - traMADol (ULTRAM) 50 MG tablet; Take 2 tablets (100 mg total) by mouth every 8 (eight) hours as needed.  Dispense: 180 tablet; Refill: 0 - traMADol (ULTRAM) 50 MG tablet; TAKE 2 TABLET EVERY 8 HOURS AS NEEDED  Dispense: 180 tablet; Refill: 0 - DME Wheelchair manual  8. Peripheral edema  - CMP14+EGFR - DME Wheelchair manual  9. Vitamin D deficiency - CMP14+EGFR - VITAMIN D 25 Hydroxy (Vit-D Deficiency, Fractures)  10. Hyperlipidemia - CMP14+EGFR - Lipid panel  11. Diabetic peripheral neuropathy (HCC) - CMP14+EGFR - traMADol (ULTRAM) 50 MG tablet; Take 2 tablets (100 mg total) by mouth every 8 (eight) hours as needed. Do not fill unitl 08/08/15  Dispense: 180 tablet; Refill: 0 - traMADol (  ULTRAM) 50 MG tablet; Take 2 tablets (100 mg  total) by mouth every 8 (eight) hours as needed.  Dispense: 180 tablet; Refill: 0 - traMADol (ULTRAM) 50 MG tablet; TAKE 2 TABLET EVERY 8 HOURS AS NEEDED  Dispense: 180 tablet; Refill: 0 - DME Wheelchair manual    Continue all meds Labs pending Health Maintenance reviewed Diet and exercise encouraged RTO 3 months  Evelina Dun, FNP

## 2015-10-13 LAB — CMP14+EGFR
ALBUMIN: 3.3 g/dL — AB (ref 3.6–4.8)
ALK PHOS: 134 IU/L — AB (ref 39–117)
ALT: 8 IU/L (ref 0–32)
AST: 8 IU/L (ref 0–40)
Albumin/Globulin Ratio: 1 — ABNORMAL LOW (ref 1.2–2.2)
BUN / CREAT RATIO: 17 (ref 11–26)
BUN: 15 mg/dL (ref 8–27)
Bilirubin Total: 0.3 mg/dL (ref 0.0–1.2)
CO2: 27 mmol/L (ref 18–29)
CREATININE: 0.9 mg/dL (ref 0.57–1.00)
Calcium: 9 mg/dL (ref 8.7–10.3)
Chloride: 98 mmol/L (ref 96–106)
GFR calc non Af Amer: 67 mL/min/{1.73_m2} (ref >59)
GFR, EST AFRICAN AMERICAN: 77 mL/min/{1.73_m2} (ref >59)
GLOBULIN, TOTAL: 3.4 g/dL (ref 1.5–4.5)
Glucose: 82 mg/dL (ref 65–99)
Potassium: 4.8 mmol/L (ref 3.5–5.2)
SODIUM: 140 mmol/L (ref 134–144)
TOTAL PROTEIN: 6.7 g/dL (ref 6.0–8.5)

## 2015-10-13 LAB — LIPID PANEL
CHOL/HDL RATIO: 3.3 ratio (ref 0.0–4.4)
CHOLESTEROL TOTAL: 150 mg/dL (ref 100–199)
HDL: 45 mg/dL (ref >39)
LDL CALC: 81 mg/dL (ref 0–99)
Triglycerides: 121 mg/dL (ref 0–149)
VLDL CHOLESTEROL CAL: 24 mg/dL (ref 5–40)

## 2015-10-13 LAB — VITAMIN D 25 HYDROXY (VIT D DEFICIENCY, FRACTURES): Vit D, 25-Hydroxy: 32.6 ng/mL (ref 30.0–100.0)

## 2015-10-13 LAB — THYROID PANEL WITH TSH
Free Thyroxine Index: 2.6 (ref 1.2–4.9)
T3 UPTAKE RATIO: 25 % (ref 24–39)
T4 TOTAL: 10.3 ug/dL (ref 4.5–12.0)
TSH: 2.98 u[IU]/mL (ref 0.450–4.500)

## 2015-11-07 ENCOUNTER — Other Ambulatory Visit: Payer: Self-pay | Admitting: Family

## 2015-11-11 LAB — HM DIABETES EYE EXAM

## 2015-11-13 ENCOUNTER — Telehealth: Payer: Self-pay | Admitting: Family

## 2015-11-13 NOTE — Telephone Encounter (Signed)
Patient aware that the form is ready other than Christy's signature.

## 2015-11-14 ENCOUNTER — Other Ambulatory Visit: Payer: Self-pay | Admitting: Family

## 2015-11-23 ENCOUNTER — Other Ambulatory Visit: Payer: Self-pay | Admitting: Family

## 2016-01-05 ENCOUNTER — Other Ambulatory Visit: Payer: Self-pay | Admitting: Family

## 2016-01-05 NOTE — Telephone Encounter (Signed)
Last seen 10/12/15 Neysa BonitoChristy  If approved route to nurse to call into [facility]

## 2016-01-05 NOTE — Telephone Encounter (Signed)
Called to [facility]. Refill was on file, filled today

## 2016-01-06 ENCOUNTER — Other Ambulatory Visit: Payer: Self-pay | Admitting: Family

## 2016-01-14 ENCOUNTER — Ambulatory Visit: Payer: BLUE CROSS/BLUE SHIELD | Admitting: Family

## 2016-01-15 ENCOUNTER — Ambulatory Visit (INDEPENDENT_AMBULATORY_CARE_PROVIDER_SITE_OTHER): Payer: BLUE CROSS/BLUE SHIELD | Admitting: Family

## 2016-01-15 ENCOUNTER — Encounter: Payer: Self-pay | Admitting: Family

## 2016-01-15 ENCOUNTER — Telehealth: Payer: Self-pay | Admitting: Family

## 2016-01-15 VITALS — BP 137/75 | HR 67 | Temp 97.2°F | Ht 63.0 in | Wt 303.0 lb

## 2016-01-15 DIAGNOSIS — M545 Low back pain, unspecified: Secondary | ICD-10-CM

## 2016-01-15 DIAGNOSIS — R609 Edema, unspecified: Secondary | ICD-10-CM

## 2016-01-15 DIAGNOSIS — E039 Hypothyroidism, unspecified: Secondary | ICD-10-CM

## 2016-01-15 DIAGNOSIS — Z23 Encounter for immunization: Secondary | ICD-10-CM

## 2016-01-15 DIAGNOSIS — E1142 Type 2 diabetes mellitus with diabetic polyneuropathy: Secondary | ICD-10-CM | POA: Diagnosis not present

## 2016-01-15 DIAGNOSIS — J441 Chronic obstructive pulmonary disease with (acute) exacerbation: Secondary | ICD-10-CM | POA: Diagnosis not present

## 2016-01-15 DIAGNOSIS — E785 Hyperlipidemia, unspecified: Secondary | ICD-10-CM

## 2016-01-15 DIAGNOSIS — M1990 Unspecified osteoarthritis, unspecified site: Secondary | ICD-10-CM

## 2016-01-15 DIAGNOSIS — I10 Essential (primary) hypertension: Secondary | ICD-10-CM | POA: Diagnosis not present

## 2016-01-15 DIAGNOSIS — E1165 Type 2 diabetes mellitus with hyperglycemia: Secondary | ICD-10-CM

## 2016-01-15 DIAGNOSIS — K219 Gastro-esophageal reflux disease without esophagitis: Secondary | ICD-10-CM | POA: Diagnosis not present

## 2016-01-15 DIAGNOSIS — E559 Vitamin D deficiency, unspecified: Secondary | ICD-10-CM | POA: Diagnosis not present

## 2016-01-15 DIAGNOSIS — G8929 Other chronic pain: Secondary | ICD-10-CM

## 2016-01-15 LAB — BAYER DCA HB A1C WAIVED: HB A1C: 7.6 % — AB (ref <7.0)

## 2016-01-15 MED ORDER — TRAMADOL HCL 50 MG PO TABS: ORAL_TABLET | ORAL | Status: DC

## 2016-01-15 NOTE — Addendum Note (Signed)
Addended by: Jannifer RodneyHAWKS, Virgie Chery A on: 01/15/2016 02:23 PM   Modules accepted: Orders, SmartSet

## 2016-01-15 NOTE — Progress Notes (Signed)
Subjective:    Patient ID: Jennifer Rasmussen, female    DOB: 05-05-1951, 65 y.o.   MRN: [id number]  PT presents to the office today for chronic follow up.  Diabetes She presents for her follow-up diabetic visit. She has type 2 diabetes mellitus. Her disease course has been stable. Pertinent negatives for hypoglycemia include no confusion, dizziness, headaches, mood changes or nervousness/anxiousness. Pertinent negatives for diabetes include no blurred vision, no chest pain, no foot paresthesias, no foot ulcerations and no visual change. Pertinent negatives for hypoglycemia complications include no blackouts, no hospitalization and no nocturnal hypoglycemia. Symptoms are worsening. Diabetic complications include peripheral neuropathy (in "hands"). Pertinent negatives for diabetic complications include no CVA, heart disease, nephropathy or PVD. Risk factors for coronary artery disease include diabetes mellitus, dyslipidemia, hypertension, obesity, post-menopausal, family history, stress and tobacco exposure. Current diabetic treatment includes oral agent (dual therapy) and oral agent (monotherapy). She is compliant with treatment some of the time. She is following a generally unhealthy diet. Her breakfast blood glucose range is generally 180-200 mg/dl. An ACE inhibitor/angiotensin II receptor blocker is being taken. Eye exam is current.  Hypertension This is a chronic problem. The current episode started more than 1 year ago. The problem has been resolved since onset. The problem is controlled. Associated symptoms include peripheral edema and shortness of breath ("when walking"). Pertinent negatives include no blurred vision, chest pain, headaches or palpitations. Risk factors for coronary artery disease include dyslipidemia, diabetes mellitus, family history, obesity, sedentary lifestyle and post-menopausal state. Past treatments include ACE inhibitors and beta blockers. The current treatment provides moderate  improvement. Hypertensive end-organ damage includes a thyroid problem. There is no history of kidney disease, CAD/MI, CVA, heart failure or PVD.  Hyperlipidemia This is a chronic problem. The current episode started more than 1 year ago. The problem is controlled. Recent lipid tests were reviewed and are normal. Exacerbating diseases include diabetes and hypothyroidism. Factors aggravating her hyperlipidemia include smoking and fatty foods. Associated symptoms include shortness of breath ("when walking"). Pertinent negatives include no chest pain, leg pain or myalgias. Current antihyperlipidemic treatment includes statins. Risk factors for coronary artery disease include diabetes mellitus, dyslipidemia, hypertension, obesity, post-menopausal and family history.  Thyroid Problem Presents for follow-up visit. Symptoms include dry skin and hair loss. Patient reports no anxiety, constipation, depressed mood, diarrhea, hoarse voice, leg swelling, nail problem, palpitations or visual change. The symptoms have been stable. Past treatments include levothyroxine. The treatment provided significant relief. Her past medical history is significant for diabetes and hyperlipidemia. There is no history of heart failure.  Gastroesophageal Reflux She reports no belching, no chest pain, no coughing, no globus sensation, no heartburn, no hoarse voice or no sore throat. This is a chronic problem. The current episode started more than 1 year ago. The problem occurs rarely. The problem has been resolved. The symptoms are aggravated by lying down and certain foods. Risk factors include obesity. She has tried a PPI for the symptoms. The treatment provided moderate relief.  Peripheral Edema PT currently talking lasix 20 mg daily, wears compression hose daily, use full SCD's for one hour day. PT states her edema is stable.  COPD Pt currently taking Symbicort BID and uses nebulizer as needed. Pt states if she has to walk any  distance she becomes SOB.    Review of Systems  Constitutional: Negative.   HENT: Negative.  Negative for hoarse voice and sore throat.   Eyes: Negative.  Negative for blurred vision.  Respiratory: Positive for  shortness of breath ("when walking"). Negative for cough.   Cardiovascular: Negative.  Negative for chest pain and palpitations.  Gastrointestinal: Negative for heartburn, diarrhea and constipation.  Endocrine: Negative.   Genitourinary: Negative.   Musculoskeletal: Negative.  Negative for myalgias.  Neurological: Negative.  Negative for dizziness and headaches.  Hematological: Negative.   Psychiatric/Behavioral: Negative.  Negative for confusion. The patient is not nervous/anxious.   All other systems reviewed and are negative.      Objective:   Physical Exam  Constitutional: She is oriented to person, place, and time. She appears well-developed and well-nourished. No distress.  Morbid obese  HENT:  Head: Normocephalic and atraumatic.  Right Ear: External ear normal.  Left Ear: External ear normal.  Nose: Nose normal.  Mouth/Throat: Oropharynx is clear and moist.  Eyes: Pupils are equal, round, and reactive to light.  Neck: Normal range of motion. Neck supple. No thyromegaly present.  Cardiovascular: Normal rate, regular rhythm, normal heart sounds and intact distal pulses.   No murmur heard. Pulmonary/Chest: Effort normal and breath sounds normal. No respiratory distress. She has no wheezes.  Abdominal: Soft. Bowel sounds are normal. She exhibits no distension. There is no tenderness.  Musculoskeletal: She exhibits edema (3+ in BLE). She exhibits no tenderness.  Bilateral unna boots present, pt present in wheelchair  Neurological: She is alert and oriented to person, place, and time.  Skin: Skin is warm and dry.  Psychiatric: She has a normal mood and affect. Her behavior is normal. Judgment and thought content normal.  Vitals reviewed.     BP 137/75 mmHg   Pulse 67  Temp(Src) 97.2 F (36.2 C) (Oral)  Ht '5\' 3"'$  (1.6 m)  Wt 303 lb (137.44 kg)  BMI 53.69 kg/m2  LMP 10/24/2001     Assessment & Plan:  1. Essential hypertension, benign - CMP14+EGFR  2. COPD exacerbation ([place]) - CMP14+EGFR  3. Gastroesophageal reflux disease, esophagitis presence not specified - CMP14+EGFR  4. Type 2 diabetes mellitus with hyperglycemia, without long-term current use of insulin (HCC) - Bayer DCA Hb A1c Waived - CMP14+EGFR - Microalbumin / creatinine urine ratio  5. Arthritis - CMP14+EGFR  6. Morbid obesity, unspecified obesity type ([place]) - CMP14+EGFR  7. Hyperlipidemia - CMP14+EGFR - Lipid panel  8. Vitamin D deficiency - CMP14+EGFR  9. Peripheral edema - CMP14+EGFR  10. Chronic midline low back pain without sciatica - CMP14+EGFR  11. Hypothyroidism, unspecified hypothyroidism type - CMP14+EGFR - Thyroid Panel With TSH   Continue all meds Labs pending Health Maintenance reviewed Diet and exercise encouraged RTO 3 months   Evelina Dun, FNP

## 2016-01-15 NOTE — Telephone Encounter (Signed)
Jennifer CoasterGina talked to patient

## 2016-01-15 NOTE — Patient Instructions (Signed)
Health Maintenance, Female Adopting a healthy lifestyle and getting preventive care can go a long way to promote health and wellness. Talk with your health care provider about what schedule of regular examinations is right for you. This is a good chance for you to check in with your provider about disease prevention and staying healthy. In between checkups, there are plenty of things you can do on your own. Experts have done a lot of research about which lifestyle changes and preventive measures are most likely to keep you healthy. Ask your health care provider for more information. WEIGHT AND DIET  Eat a healthy diet  Be sure to include plenty of vegetables, fruits, low-fat dairy products, and lean protein.  Do not eat a lot of foods high in solid fats, added sugars, or salt.  Get regular exercise. This is one of the most important things you can do for your health.  Most adults should exercise for at least 150 minutes each week. The exercise should increase your heart rate and make you sweat (moderate-intensity exercise).  Most adults should also do strengthening exercises at least twice a week. This is in addition to the moderate-intensity exercise.  Maintain a healthy weight  Body mass index (BMI) is a measurement that can be used to identify possible weight problems. It estimates body fat based on height and weight. Your health care provider can help determine your BMI and help you achieve or maintain a healthy weight.  For females 20 years of age and older:   A BMI below 18.5 is considered underweight.  A BMI of 18.5 to 24.9 is normal.  A BMI of 25 to 29.9 is considered overweight.  A BMI of 30 and above is considered obese.  Watch levels of cholesterol and blood lipids  You should start having your blood tested for lipids and cholesterol at 66 years of age, then have this test every 5 years.  You may need to have your cholesterol levels checked more often if:  Your lipid  or cholesterol levels are high.  You are older than 66 years of age.  You are at high risk for heart disease.  CANCER SCREENING   Lung Cancer  Lung cancer screening is recommended for adults 55-80 years old who are at high risk for lung cancer because of a history of smoking.  A yearly low-dose CT scan of the lungs is recommended for people who:  Currently smoke.  Have quit within the past 15 years.  Have at least a 30-pack-year history of smoking. A pack year is smoking an average of one pack of cigarettes a day for 1 year.  Yearly screening should continue until it has been 15 years since you quit.  Yearly screening should stop if you develop a health problem that would prevent you from having lung cancer treatment.  Breast Cancer  Practice breast self-awareness. This means understanding how your breasts normally appear and feel.  It also means doing regular breast self-exams. Let your health care provider know about any changes, no matter how small.  If you are in your 20s or 30s, you should have a clinical breast exam (CBE) by a health care provider every 1-3 years as part of a regular health exam.  If you are 40 or older, have a CBE every year. Also consider having a breast X-ray (mammogram) every year.  If you have a family history of breast cancer, talk to your health care provider about genetic screening.  If you   are at high risk for breast cancer, talk to your health care provider about having an MRI and a mammogram every year.  Breast cancer gene (BRCA) assessment is recommended for women who have family members with BRCA-related cancers. BRCA-related cancers include:  Breast.  Ovarian.  Tubal.  Peritoneal cancers.  Results of the assessment will determine the need for genetic counseling and BRCA1 and BRCA2 testing. Cervical Cancer Your health care provider may recommend that you be screened regularly for cancer of the pelvic organs (ovaries, uterus, and  vagina). This screening involves a pelvic examination, including checking for microscopic changes to the surface of your cervix (Pap test). You may be encouraged to have this screening done every 3 years, beginning at age 21.  For women ages 30-65, health care providers may recommend pelvic exams and Pap testing every 3 years, or they may recommend the Pap and pelvic exam, combined with testing for human papilloma virus (HPV), every 5 years. Some types of HPV increase your risk of cervical cancer. Testing for HPV may also be done on women of any age with unclear Pap test results.  Other health care providers may not recommend any screening for nonpregnant women who are considered low risk for pelvic cancer and who do not have symptoms. Ask your health care provider if a screening pelvic exam is right for you.  If you have had past treatment for cervical cancer or a condition that could lead to cancer, you need Pap tests and screening for cancer for at least 20 years after your treatment. If Pap tests have been discontinued, your risk factors (such as having a new sexual partner) need to be reassessed to determine if screening should resume. Some women have medical problems that increase the chance of getting cervical cancer. In these cases, your health care provider may recommend more frequent screening and Pap tests. Colorectal Cancer  This type of cancer can be detected and often prevented.  Routine colorectal cancer screening usually begins at 66 years of age and continues through 66 years of age.  Your health care provider may recommend screening at an earlier age if you have risk factors for colon cancer.  Your health care provider may also recommend using home test kits to check for hidden blood in the stool.  A small camera at the end of a tube can be used to examine your colon directly (sigmoidoscopy or colonoscopy). This is done to check for the earliest forms of colorectal  cancer.  Routine screening usually begins at age 50.  Direct examination of the colon should be repeated every 5-10 years through 66 years of age. However, you may need to be screened more often if early forms of precancerous polyps or small growths are found. Skin Cancer  Check your skin from head to toe regularly.  Tell your health care provider about any new moles or changes in moles, especially if there is a change in a mole's shape or color.  Also tell your health care provider if you have a mole that is larger than the size of a pencil eraser.  Always use sunscreen. Apply sunscreen liberally and repeatedly throughout the day.  Protect yourself by wearing long sleeves, pants, a wide-brimmed hat, and sunglasses whenever you are outside. HEART DISEASE, DIABETES, AND HIGH BLOOD PRESSURE   High blood pressure causes heart disease and increases the risk of stroke. High blood pressure is more likely to develop in:  People who have blood pressure in the high end   of the normal range (130-139/85-89 mm Hg).  People who are overweight or obese.  People who are African American.  If you are 38-23 years of age, have your blood pressure checked every 3-5 years. If you are 61 years of age or older, have your blood pressure checked every year. You should have your blood pressure measured twice--once when you are at a hospital or clinic, and once when you are not at a hospital or clinic. Record the average of the two measurements. To check your blood pressure when you are not at a hospital or clinic, you can use:  An automated blood pressure machine at a pharmacy.  A home blood pressure monitor.  If you are between 45 years and 39 years old, ask your health care provider if you should take aspirin to prevent strokes.  Have regular diabetes screenings. This involves taking a blood sample to check your fasting blood sugar level.  If you are at a normal weight and have a low risk for diabetes,  have this test once every three years after 66 years of age.  If you are overweight and have a high risk for diabetes, consider being tested at a younger age or more often. PREVENTING INFECTION  Hepatitis B  If you have a higher risk for hepatitis B, you should be screened for this virus. You are considered at high risk for hepatitis B if:  You were born in a country where hepatitis B is common. Ask your health care provider which countries are considered high risk.  Your parents were born in a high-risk country, and you have not been immunized against hepatitis B (hepatitis B vaccine).  You have HIV or AIDS.  You use needles to inject street drugs.  You live with someone who has hepatitis B.  You have had sex with someone who has hepatitis B.  You get hemodialysis treatment.  You take certain medicines for conditions, including cancer, organ transplantation, and autoimmune conditions. Hepatitis C  Blood testing is recommended for:  Everyone born from [birth year] through 1966.  Anyone with known risk factors for hepatitis C. Sexually transmitted infections (STIs)  You should be screened for sexually transmitted infections (STIs) including gonorrhea and chlamydia if:  You are sexually active and are younger than 66 years of age.  You are older than 66 years of age and your health care provider tells you that you are at risk for this type of infection.  Your sexual activity has changed since you were last screened and you are at an increased risk for chlamydia or gonorrhea. Ask your health care provider if you are at risk.  If you do not have HIV, but are at risk, it may be recommended that you take a prescription medicine daily to prevent HIV infection. This is called pre-exposure prophylaxis (PrEP). You are considered at risk if:  You are sexually active and do not regularly use condoms or know the HIV status of your partner(s).  You take drugs by injection.  You are sexually  active with a partner who has HIV. Talk with your health care provider about whether you are at high risk of being infected with HIV. If you choose to begin PrEP, you should first be tested for HIV. You should then be tested every 3 months for as long as you are taking PrEP.  PREGNANCY   If you are premenopausal and you may become pregnant, ask your health care provider about preconception counseling.  If you may  become pregnant, take 400 to 800 micrograms (mcg) of folic acid every day.  If you want to prevent pregnancy, talk to your health care provider about birth control (contraception). OSTEOPOROSIS AND MENOPAUSE   Osteoporosis is a disease in which the bones lose minerals and strength with aging. This can result in serious bone fractures. Your risk for osteoporosis can be identified using a bone density scan.  If you are 61 years of age or older, or if you are at risk for osteoporosis and fractures, ask your health care provider if you should be screened.  Ask your health care provider whether you should take a calcium or vitamin D supplement to lower your risk for osteoporosis.  Menopause may have certain physical symptoms and risks.  Hormone replacement therapy may reduce some of these symptoms and risks. Talk to your health care provider about whether hormone replacement therapy is right for you.  HOME CARE INSTRUCTIONS   Schedule regular health, dental, and eye exams.  Stay current with your immunizations.   Do not use any tobacco products including cigarettes, chewing tobacco, or electronic cigarettes.  If you are pregnant, do not drink alcohol.  If you are breastfeeding, limit how much and how often you drink alcohol.  Limit alcohol intake to no more than 1 drink per day for nonpregnant women. One drink equals 12 ounces of beer, 5 ounces of wine, or 1 ounces of hard liquor.  Do not use street drugs.  Do not share needles.  Ask your health care provider for help if  you need support or information about quitting drugs.  Tell your health care provider if you often feel depressed.  Tell your health care provider if you have ever been abused or do not feel safe at home.   This information is not intended to replace advice given to you by your health care provider. Make sure you discuss any questions you have with your health care provider.   Document Released: 01/17/2011 Document Revised: 07/25/2014 Document Reviewed: 06/05/2013 Elsevier Interactive Patient Education [employer].

## 2016-01-15 NOTE — Addendum Note (Signed)
Addended by: Almeta MonasSTONE, Paiton [place]tinghouse M on: 01/15/2016 12:30 PM   Modules accepted: Orders, SmartSet

## 2016-01-16 LAB — LIPID PANEL
CHOL/HDL RATIO: 3.6 ratio (ref 0.0–4.4)
CHOLESTEROL TOTAL: 167 mg/dL (ref 100–199)
HDL: 46 mg/dL (ref >39)
LDL Calculated: 95 mg/dL (ref 0–99)
TRIGLYCERIDES: 132 mg/dL (ref 0–149)
VLDL Cholesterol Cal: 26 mg/dL (ref 5–40)

## 2016-01-16 LAB — CMP14+EGFR
ALK PHOS: 138 IU/L — AB (ref 39–117)
ALT: 7 IU/L (ref 0–32)
AST: 7 IU/L (ref 0–40)
Albumin/Globulin Ratio: 1.1 — ABNORMAL LOW (ref 1.2–2.2)
Albumin: 3.5 g/dL — ABNORMAL LOW (ref 3.6–4.8)
BUN/Creatinine Ratio: 17 (ref 12–28)
BUN: 15 mg/dL (ref 8–27)
Bilirubin Total: 0.4 mg/dL (ref 0.0–1.2)
CALCIUM: 9.2 mg/dL (ref 8.7–10.3)
CO2: 27 mmol/L (ref 18–29)
CREATININE: 0.88 mg/dL (ref 0.57–1.00)
Chloride: 96 mmol/L (ref 96–106)
GFR calc Af Amer: 79 mL/min/{1.73_m2} (ref >59)
GFR, EST NON AFRICAN AMERICAN: 69 mL/min/{1.73_m2} (ref >59)
GLUCOSE: 130 mg/dL — AB (ref 65–99)
Globulin, Total: 3.2 g/dL (ref 1.5–4.5)
Potassium: 4.5 mmol/L (ref 3.5–5.2)
Sodium: 139 mmol/L (ref 134–144)
Total Protein: 6.7 g/dL (ref 6.0–8.5)

## 2016-01-16 LAB — THYROID PANEL WITH TSH
FREE THYROXINE INDEX: 2.6 (ref 1.2–4.9)
T3 UPTAKE RATIO: 28 % (ref 24–39)
T4, Total: 9.2 ug/dL (ref 4.5–12.0)
TSH: 1.88 u[IU]/mL (ref 0.450–4.500)

## 2016-01-16 LAB — MICROALBUMIN / CREATININE URINE RATIO
CREATININE, UR: 111.5 mg/dL
MICROALB/CREAT RATIO: 22.5 mg/g{creat} (ref 0.0–30.0)
Microalbumin, Urine: 25.1 ug/mL

## 2016-01-18 ENCOUNTER — Other Ambulatory Visit: Payer: Self-pay | Admitting: Family

## 2016-01-18 MED ORDER — CANAGLIFLOZIN 100 MG PO TABS: 100.0000 mg | ORAL_TABLET | Freq: Every day | ORAL | Status: DC

## 2016-01-18 MED ORDER — DULAGLUTIDE 0.75 MG/0.5ML ~~LOC~~ SOAJ: 0.7500 mg | SUBCUTANEOUS | Status: DC

## 2016-01-22 ENCOUNTER — Other Ambulatory Visit: Payer: Self-pay | Admitting: Family

## 2016-02-05 ENCOUNTER — Other Ambulatory Visit: Payer: Self-pay | Admitting: Family

## 2016-03-08 ENCOUNTER — Other Ambulatory Visit: Payer: Self-pay | Admitting: Family

## 2016-03-08 DIAGNOSIS — M1990 Unspecified osteoarthritis, unspecified site: Secondary | ICD-10-CM

## 2016-03-08 DIAGNOSIS — E1142 Type 2 diabetes mellitus with diabetic polyneuropathy: Secondary | ICD-10-CM

## 2016-03-08 NOTE — Telephone Encounter (Signed)
Last written on 01/15/16 with 2 refills.  If approved print and route to pool for nurse to call for pickup.

## 2016-03-08 NOTE — Telephone Encounter (Signed)
Script called to pharmacy

## 2016-03-12 ENCOUNTER — Other Ambulatory Visit: Payer: Self-pay | Admitting: Family

## 2016-03-17 ENCOUNTER — Other Ambulatory Visit: Payer: Self-pay | Admitting: *Deleted

## 2016-03-17 MED ORDER — BUDESONIDE-FORMOTEROL FUMARATE 160-4.5 MCG/ACT IN AERO: INHALATION_SPRAY | RESPIRATORY_TRACT | 4 refills | Status: DC

## 2016-04-19 ENCOUNTER — Ambulatory Visit (INDEPENDENT_AMBULATORY_CARE_PROVIDER_SITE_OTHER): Payer: BLUE CROSS/BLUE SHIELD | Admitting: Family

## 2016-04-19 ENCOUNTER — Encounter: Payer: Self-pay | Admitting: Family

## 2016-04-19 VITALS — BP 113/62 | HR 78 | Temp 97.8°F | Ht 63.0 in | Wt 303.0 lb

## 2016-04-19 DIAGNOSIS — M1990 Unspecified osteoarthritis, unspecified site: Secondary | ICD-10-CM | POA: Diagnosis not present

## 2016-04-19 DIAGNOSIS — E785 Hyperlipidemia, unspecified: Secondary | ICD-10-CM | POA: Diagnosis not present

## 2016-04-19 DIAGNOSIS — R609 Edema, unspecified: Secondary | ICD-10-CM | POA: Diagnosis not present

## 2016-04-19 DIAGNOSIS — E039 Hypothyroidism, unspecified: Secondary | ICD-10-CM

## 2016-04-19 DIAGNOSIS — Z1211 Encounter for screening for malignant neoplasm of colon: Secondary | ICD-10-CM

## 2016-04-19 DIAGNOSIS — E1165 Type 2 diabetes mellitus with hyperglycemia: Secondary | ICD-10-CM | POA: Diagnosis not present

## 2016-04-19 DIAGNOSIS — I10 Essential (primary) hypertension: Secondary | ICD-10-CM | POA: Diagnosis not present

## 2016-04-19 DIAGNOSIS — J441 Chronic obstructive pulmonary disease with (acute) exacerbation: Secondary | ICD-10-CM

## 2016-04-19 DIAGNOSIS — E559 Vitamin D deficiency, unspecified: Secondary | ICD-10-CM

## 2016-04-19 DIAGNOSIS — G4700 Insomnia, unspecified: Secondary | ICD-10-CM

## 2016-04-19 DIAGNOSIS — K219 Gastro-esophageal reflux disease without esophagitis: Secondary | ICD-10-CM | POA: Diagnosis not present

## 2016-04-19 DIAGNOSIS — E1142 Type 2 diabetes mellitus with diabetic polyneuropathy: Secondary | ICD-10-CM | POA: Diagnosis not present

## 2016-04-19 LAB — BAYER DCA HB A1C WAIVED: HB A1C (BAYER DCA - WAIVED): 6.8 % (ref <7.0)

## 2016-04-19 MED ORDER — TRAMADOL HCL 50 MG PO TABS: 100.0000 mg | ORAL_TABLET | Freq: Three times a day (TID) | ORAL | 2 refills | Status: DC | PRN

## 2016-04-19 MED ORDER — CARVEDILOL 12.5 MG PO TABS: 12.5000 mg | ORAL_TABLET | Freq: Every day | ORAL | 3 refills | Status: DC

## 2016-04-19 MED ORDER — SIMVASTATIN 20 MG PO TABS: ORAL_TABLET | ORAL | 1 refills | Status: DC

## 2016-04-19 MED ORDER — GABAPENTIN 400 MG PO CAPS: 400.0000 mg | ORAL_CAPSULE | Freq: Three times a day (TID) | ORAL | 1 refills | Status: DC

## 2016-04-19 MED ORDER — FUROSEMIDE 20 MG PO TABS: 20.0000 mg | ORAL_TABLET | Freq: Every day | ORAL | 1 refills | Status: DC

## 2016-04-19 MED ORDER — LEVOTHYROXINE SODIUM 50 MCG PO TABS: 50.0000 ug | ORAL_TABLET | Freq: Every day | ORAL | 2 refills | Status: DC

## 2016-04-19 MED ORDER — BUDESONIDE-FORMOTEROL FUMARATE 160-4.5 MCG/ACT IN AERO: INHALATION_SPRAY | RESPIRATORY_TRACT | 4 refills | Status: DC

## 2016-04-19 MED ORDER — LISINOPRIL 10 MG PO TABS: 10.0000 mg | ORAL_TABLET | Freq: Every day | ORAL | 1 refills | Status: DC

## 2016-04-19 MED ORDER — MELOXICAM 15 MG PO TABS: 15.0000 mg | ORAL_TABLET | Freq: Every day | ORAL | 3 refills | Status: DC

## 2016-04-19 MED ORDER — TRAZODONE HCL 50 MG PO TABS: 25.0000 mg | ORAL_TABLET | Freq: Every evening | ORAL | 3 refills | Status: DC | PRN

## 2016-04-19 MED ORDER — GLIMEPIRIDE 4 MG PO TABS: 8.0000 mg | ORAL_TABLET | Freq: Every day | ORAL | 0 refills | Status: DC

## 2016-04-19 NOTE — Progress Notes (Signed)
Subjective:    Patient ID: Jennifer Rasmussen, female    DOB: [birth year], 65 y.o.   MRN: [id number]  PT presents to the office today for chronic follow up.  Diabetes  She presents for her follow-up diabetic visit. She has type 2 diabetes mellitus. Her disease course has been stable. Pertinent negatives for hypoglycemia include no confusion, dizziness, headaches, mood changes or nervousness/anxiousness. Pertinent negatives for diabetes include no blurred vision, no chest pain, no foot paresthesias, no foot ulcerations and no visual change. Pertinent negatives for hypoglycemia complications include no blackouts, no hospitalization and no nocturnal hypoglycemia. Symptoms are worsening. Diabetic complications include peripheral neuropathy (in "hands"). Pertinent negatives for diabetic complications include no CVA, heart disease, nephropathy or PVD. Risk factors for coronary artery disease include diabetes mellitus, dyslipidemia, hypertension, obesity, post-menopausal, family history, stress and tobacco exposure. Current diabetic treatment includes oral agent (dual therapy) and oral agent (monotherapy). She is compliant with treatment some of the time. She is following a generally unhealthy diet. Her breakfast blood glucose range is generally 180-200 mg/dl. (Does not take blood glucose regularly ) An ACE inhibitor/angiotensin II receptor blocker is being taken. Eye exam is current.  Hypertension  This is a chronic problem. The current episode started more than 1 year ago. The problem has been resolved since onset. The problem is controlled. Associated symptoms include peripheral edema and shortness of breath ("when walking"). Pertinent negatives include no blurred vision, chest pain, headaches or palpitations. Risk factors for coronary artery disease include dyslipidemia, diabetes mellitus, family history, obesity, sedentary lifestyle and post-menopausal state. Past treatments include ACE inhibitors and beta  blockers. The current treatment provides moderate improvement. Hypertensive end-organ damage includes a thyroid problem. There is no history of kidney disease, CAD/MI, CVA, heart failure or PVD.  Hyperlipidemia  This is a chronic problem. The current episode started more than 1 year ago. The problem is controlled. Recent lipid tests were reviewed and are normal. Exacerbating diseases include diabetes, hypothyroidism and obesity. Factors aggravating her hyperlipidemia include smoking and fatty foods. Associated symptoms include shortness of breath ("when walking"). Pertinent negatives include no chest pain, leg pain or myalgias. Current antihyperlipidemic treatment includes statins. Risk factors for coronary artery disease include diabetes mellitus, dyslipidemia, hypertension, obesity, post-menopausal and family history.  Thyroid Problem  Presents for follow-up visit. Symptoms include dry skin and hair loss. Patient reports no anxiety, constipation, depressed mood, diarrhea, hoarse voice, leg swelling, nail problem, palpitations or visual change. The symptoms have been stable. Past treatments include levothyroxine. The treatment provided significant relief. Her past medical history is significant for diabetes and hyperlipidemia. There is no history of heart failure.  Gastroesophageal Reflux  She reports no belching, no chest pain, no coughing, no globus sensation, no heartburn, no hoarse voice or no sore throat. This is a chronic problem. The current episode started more than 1 year ago. The problem occurs rarely. The problem has been resolved. The symptoms are aggravated by lying down and certain foods. Risk factors include obesity. She has tried a PPI for the symptoms. The treatment provided moderate relief.  Insomnia  Primary symptoms: fragmented sleep, difficulty falling asleep, frequent awakening.  The current episode started more than one year. The onset quality is gradual. The problem occurs  intermittently. The problem has been waxing and waning since onset. Past treatments include meditation. The treatment provided mild relief.  Peripheral Edema PT currently talking lasix 20 mg daily, wears compression hose daily, use full SCD's for every other day. PT states her  edema is stable.  COPD Pt currently taking Symbicort BID and uses nebulizer as needed. Pt states if she has to walk any distance she becomes SOB.  Diabetic Neuropathy  PT states this is stable. PT currently taking gabapentin 400 mg TID.   Review of Systems  Constitutional: Negative.   HENT: Negative.  Negative for hoarse voice and sore throat.   Eyes: Negative.  Negative for blurred vision.  Respiratory: Positive for shortness of breath ("when walking"). Negative for cough.   Cardiovascular: Negative.  Negative for chest pain and palpitations.  Gastrointestinal: Negative for constipation, diarrhea and heartburn.  Endocrine: Negative.   Genitourinary: Negative.   Musculoskeletal: Negative.  Negative for myalgias.  Neurological: Negative.  Negative for dizziness and headaches.  Hematological: Negative.   Psychiatric/Behavioral: Negative for confusion. The patient has insomnia. The patient is not nervous/anxious.   All other systems reviewed and are negative.      Objective:   Physical Exam  Constitutional: She is oriented to person, place, and time. She appears well-developed and well-nourished. No distress.  Morbid obese  HENT:  Head: Normocephalic and atraumatic.  Right Ear: External ear normal.  Left Ear: External ear normal.  Nose: Nose normal.  Mouth/Throat: Oropharynx is clear and moist.  Eyes: Pupils are equal, round, and reactive to light.  Neck: Normal range of motion. Neck supple. No thyromegaly present.  Cardiovascular: Normal rate, regular rhythm, normal heart sounds and intact distal pulses.   No murmur heard. Pulmonary/Chest: Effort normal and breath sounds normal. No respiratory distress. She  has no wheezes.  Abdominal: Soft. Bowel sounds are normal. She exhibits no distension. There is no tenderness.  Musculoskeletal: She exhibits edema (3+ in BLE). She exhibits no tenderness.  Bilateral unna boots present, pt present in wheelchair  Neurological: She is alert and oriented to person, place, and time.  Skin: Skin is warm and dry.  Psychiatric: She has a normal mood and affect. Her behavior is normal. Judgment and thought content normal.  Vitals reviewed.     BP 113/62   Pulse 78   Temp 97.8 F (36.6 C) (Oral)   Ht _0  (1.6 m)   Wt (!) 303 lb (137.4 kg)   LMP 10/24/2001   BMI 53.67 kg/m      Assessment & Plan:   1. Essential hypertension, benign - CMP14+EGFR - lisinopril (PRINIVIL,ZESTRIL) 10 MG tablet; Take 1 tablet (10 mg total) by mouth daily.  Dispense: 90 tablet; Refill: 1 - carvedilol (COREG) 12.5 MG tablet; Take 1 tablet (12.5 mg total) by mouth daily.  Dispense: 90 tablet; Refill: 3  2. COPD exacerbation (HCC) - CMP14+EGFR - budesonide-formoterol (SYMBICORT) 160-4.5 MCG/ACT inhaler; USE 2 INHALATIONS TWICE A DAY  Dispense: 3 Inhaler; Refill: 4  3. Gastroesophageal reflux disease, esophagitis presence not specified - CMP14+EGFR  4. Type 2 diabetes mellitus with hyperglycemia, without long-term current use of insulin (HCC) - CMP14+EGFR - Bayer DCA Hb A1c Waived - glimepiride (AMARYL) 4 MG tablet; Take 2 tablets (8 mg total) by mouth daily with breakfast.  Dispense: 180 tablet; Refill: 0  5. Vitamin D deficiency - CMP14+EGFR - VITAMIN D 25 Hydroxy (Vit-D Deficiency, Fractures)  6. Peripheral edema - CMP14+EGFR - furosemide (LASIX) 20 MG tablet; Take 1 tablet (20 mg total) by mouth daily.  Dispense: 90 tablet; Refill: 1  7. Morbid obesity ([place]) - CMP14+EGFR  8. Hyperlipidemia, unspecified hyperlipidemia type - CMP14+EGFR - Lipid panel - simvastatin (ZOCOR) 20 MG tablet; Take 1 tablet by mouth  daily at  6 PM  Dispense: 90 tablet; Refill: 1  9.  Arthritis - CMP14+EGFR - meloxicam (MOBIC) 15 MG tablet; Take 1 tablet (15 mg total) by mouth daily.  Dispense: 90 tablet; Refill: 3 - traMADol (ULTRAM) 50 MG tablet; Take 2 tablets (100 mg total) by mouth every 8 (eight) hours as needed.  Dispense: 180 tablet; Refill: 2  10. Hypothyroidism, unspecified type - CMP14+EGFR - Thyroid Panel With TSH - levothyroxine (SYNTHROID, LEVOTHROID) 50 MCG tablet; Take 1 tablet (50 mcg total) by mouth daily.  Dispense: 90 tablet; Refill: 2  11. Colon cancer screening - CMP14+EGFR - Fecal occult blood, imunochemical; Future  12. Diabetic peripheral neuropathy (HCC) - CMP14+EGFR - gabapentin (NEURONTIN) 400 MG capsule; Take 1 capsule (400 mg total) by mouth 3 (three) times daily.  Dispense: 270 capsule; Refill: 1 - traMADol (ULTRAM) 50 MG tablet; Take 2 tablets (100 mg total) by mouth every 8 (eight) hours as needed.  Dispense: 180 tablet; Refill: 2  13. Insomnia, unspecified type -PT started on trazodone today -Told to limit caffeine, pt drinks Diet Mt Dew daily - traZODone (DESYREL) 50 MG tablet; Take 0.5-1 tablets (25-50 mg total) by mouth at bedtime as needed for sleep.  Dispense: 30 tablet; Refill: 3   Continue all meds Labs pending Health Maintenance reviewed Diet and exercise encouraged RTO 3 months  Evelina Dun, FNP

## 2016-04-19 NOTE — Patient Instructions (Signed)
Insomnia Insomnia is a sleep disorder that makes it difficult to fall asleep or to stay asleep. Insomnia can cause tiredness (fatigue), low energy, difficulty concentrating, mood swings, and poor performance at work or school.  There are three different ways to classify insomnia:  Difficulty falling asleep.  Difficulty staying asleep.  Waking up too early in the morning. Any type of insomnia can be long-term (chronic) or short-term (acute). Both are common. Short-term insomnia usually lasts for three months or less. Chronic insomnia occurs at least three times a week for longer than three months. CAUSES  Insomnia may be caused by another condition, situation, or substance, such as:  Anxiety.  Certain medicines.  Gastroesophageal reflux disease (GERD) or other gastrointestinal conditions.  Asthma or other breathing conditions.  Restless legs syndrome, sleep apnea, or other sleep disorders.  Chronic pain.  Menopause. This may include hot flashes.  Stroke.  Abuse of alcohol, tobacco, or illegal drugs.  Depression.  Caffeine.   Neurological disorders, such as Alzheimer disease.  An overactive thyroid (hyperthyroidism). The cause of insomnia may not be known. RISK FACTORS Risk factors for insomnia include:  Gender. Women are more commonly affected than men.  Age. Insomnia is more common as you get older.  Stress. This may involve your professional or personal life.  Income. Insomnia is more common in people with lower income.  Lack of exercise.   Irregular work schedule or night shifts.  Traveling between different time zones. SIGNS AND SYMPTOMS If you have insomnia, trouble falling asleep or trouble staying asleep is the main symptom. This may lead to other symptoms, such as:  Feeling fatigued.  Feeling nervous about going to sleep.  Not feeling rested in the morning.  Having trouble concentrating.  Feeling irritable, anxious, or depressed. TREATMENT   Treatment for insomnia depends on the cause. If your insomnia is caused by an underlying condition, treatment will focus on addressing the condition. Treatment may also include:   Medicines to help you sleep.  Counseling or therapy.  Lifestyle adjustments. HOME CARE INSTRUCTIONS   Take medicines only as directed by your health care provider.  Keep regular sleeping and waking hours. Avoid naps.  Keep a sleep diary to help you and your health care provider figure out what could be causing your insomnia. Include:   When you sleep.  When you wake up during the night.  How well you sleep.   How rested you feel the next day.  Any side effects of medicines you are taking.  What you eat and drink.   Make your bedroom a comfortable place where it is easy to fall asleep:  Put up shades or special blackout curtains to block light from outside.  Use a white noise machine to block noise.  Keep the temperature cool.   Exercise regularly as directed by your health care provider. Avoid exercising right before bedtime.  Use relaxation techniques to manage stress. Ask your health care provider to suggest some techniques that may work well for you. These may include:  Breathing exercises.  Routines to release muscle tension.  Visualizing peaceful scenes.  Cut back on alcohol, caffeinated beverages, and cigarettes, especially close to bedtime. These can disrupt your sleep.  Do not overeat or eat spicy foods right before bedtime. This can lead to digestive discomfort that can make it hard for you to sleep.  Limit screen use before bedtime. This includes:  Watching TV.  Using your smartphone, tablet, and computer.  Stick to a routine. This   can help you fall asleep faster. Try to do a quiet activity, brush your teeth, and go to bed at the same time each night.  Get out of bed if you are still awake after 15 minutes of trying to sleep. Keep the lights down, but try reading or  doing a quiet activity. When you feel sleepy, go back to bed.  Make sure that you drive carefully. Avoid driving if you feel very sleepy.  Keep all follow-up appointments as directed by your health care provider. This is important. SEEK MEDICAL CARE IF:   You are tired throughout the day or have trouble in your daily routine due to sleepiness.  You continue to have sleep problems or your sleep problems get worse. SEEK IMMEDIATE MEDICAL CARE IF:   You have serious thoughts about hurting yourself or someone else.   This information is not intended to replace advice given to you by your health care provider. Make sure you discuss any questions you have with your health care provider.   Document Released: 07/01/2000 Document Revised: 03/25/2015 Document Reviewed: 04/04/2014 Elsevier Interactive Patient Education 2016 [employer]  

## 2016-04-20 LAB — LIPID PANEL
CHOL/HDL RATIO: 3.1 ratio (ref 0.0–4.4)
Cholesterol, Total: 165 mg/dL (ref 100–199)
HDL: 53 mg/dL (ref >39)
LDL Calculated: 80 mg/dL (ref 0–99)
Triglycerides: 158 mg/dL — ABNORMAL HIGH (ref 0–149)
VLDL CHOLESTEROL CAL: 32 mg/dL (ref 5–40)

## 2016-04-20 LAB — VITAMIN D 25 HYDROXY (VIT D DEFICIENCY, FRACTURES): Vit D, 25-Hydroxy: 32.2 ng/mL (ref 30.0–100.0)

## 2016-04-20 LAB — CMP14+EGFR
A/G RATIO: 1.2 (ref 1.2–2.2)
ALT: 6 IU/L (ref 0–32)
AST: 8 IU/L (ref 0–40)
Albumin: 3.5 g/dL — ABNORMAL LOW (ref 3.6–4.8)
Alkaline Phosphatase: 142 IU/L — ABNORMAL HIGH (ref 39–117)
BUN / CREAT RATIO: 14 (ref 12–28)
BUN: 12 mg/dL (ref 8–27)
CALCIUM: 8.7 mg/dL (ref 8.7–10.3)
CHLORIDE: 100 mmol/L (ref 96–106)
CO2: 28 mmol/L (ref 18–29)
Creatinine, Ser: 0.87 mg/dL (ref 0.57–1.00)
GFR calc non Af Amer: 70 mL/min/{1.73_m2} (ref >59)
GFR, EST AFRICAN AMERICAN: 80 mL/min/{1.73_m2} (ref >59)
GLUCOSE: 92 mg/dL (ref 65–99)
Globulin, Total: 2.9 g/dL (ref 1.5–4.5)
Potassium: 4.6 mmol/L (ref 3.5–5.2)
SODIUM: 143 mmol/L (ref 134–144)
TOTAL PROTEIN: 6.4 g/dL (ref 6.0–8.5)

## 2016-04-20 LAB — THYROID PANEL WITH TSH
FREE THYROXINE INDEX: 2.2 (ref 1.2–4.9)
T3 Uptake Ratio: 25 % (ref 24–39)
T4 TOTAL: 8.7 ug/dL (ref 4.5–12.0)
TSH: 2.63 u[IU]/mL (ref 0.450–4.500)

## 2016-04-22 ENCOUNTER — Encounter (HOSPITAL_COMMUNITY): Payer: Self-pay

## 2016-04-28 ENCOUNTER — Other Ambulatory Visit (INDEPENDENT_AMBULATORY_CARE_PROVIDER_SITE_OTHER): Payer: BLUE CROSS/BLUE SHIELD

## 2016-04-28 DIAGNOSIS — Z1211 Encounter for screening for malignant neoplasm of colon: Secondary | ICD-10-CM

## 2016-04-30 LAB — FECAL OCCULT BLOOD, IMMUNOCHEMICAL: Fecal Occult Bld: NEGATIVE

## 2016-07-08 ENCOUNTER — Other Ambulatory Visit: Payer: Self-pay | Admitting: Family

## 2016-07-08 DIAGNOSIS — E1165 Type 2 diabetes mellitus with hyperglycemia: Secondary | ICD-10-CM

## 2016-07-25 ENCOUNTER — Ambulatory Visit: Payer: BLUE CROSS/BLUE SHIELD | Admitting: Family

## 2016-07-29 ENCOUNTER — Ambulatory Visit: Payer: BLUE CROSS/BLUE SHIELD | Admitting: Family

## 2016-08-05 ENCOUNTER — Ambulatory Visit: Payer: BLUE CROSS/BLUE SHIELD | Admitting: Family

## 2016-08-05 ENCOUNTER — Telehealth: Payer: Self-pay | Admitting: Family

## 2016-08-05 DIAGNOSIS — E1142 Type 2 diabetes mellitus with diabetic polyneuropathy: Secondary | ICD-10-CM

## 2016-08-05 DIAGNOSIS — M1990 Unspecified osteoarthritis, unspecified site: Secondary | ICD-10-CM

## 2016-08-05 MED ORDER — TRAMADOL HCL 50 MG PO TABS: 100.0000 mg | ORAL_TABLET | Freq: Three times a day (TID) | ORAL | 2 refills | Status: DC | PRN

## 2016-08-05 NOTE — Telephone Encounter (Signed)
Prescription reordered. Please call into pharmacy.

## 2016-08-05 NOTE — Telephone Encounter (Signed)
Patient aware, script is ready. [facility]

## 2016-08-09 ENCOUNTER — Ambulatory Visit (INDEPENDENT_AMBULATORY_CARE_PROVIDER_SITE_OTHER): Payer: BLUE CROSS/BLUE SHIELD

## 2016-08-09 ENCOUNTER — Other Ambulatory Visit: Payer: Self-pay | Admitting: Family

## 2016-08-09 ENCOUNTER — Ambulatory Visit (INDEPENDENT_AMBULATORY_CARE_PROVIDER_SITE_OTHER): Payer: BLUE CROSS/BLUE SHIELD | Admitting: Family

## 2016-08-09 ENCOUNTER — Encounter: Payer: Self-pay | Admitting: Family

## 2016-08-09 VITALS — BP 136/68 | HR 96 | Temp 100.5°F | Ht 63.0 in | Wt 297.3 lb

## 2016-08-09 DIAGNOSIS — E039 Hypothyroidism, unspecified: Secondary | ICD-10-CM

## 2016-08-09 DIAGNOSIS — E1165 Type 2 diabetes mellitus with hyperglycemia: Secondary | ICD-10-CM | POA: Diagnosis not present

## 2016-08-09 DIAGNOSIS — G4700 Insomnia, unspecified: Secondary | ICD-10-CM | POA: Diagnosis not present

## 2016-08-09 DIAGNOSIS — F17200 Nicotine dependence, unspecified, uncomplicated: Secondary | ICD-10-CM | POA: Insufficient documentation

## 2016-08-09 DIAGNOSIS — E559 Vitamin D deficiency, unspecified: Secondary | ICD-10-CM | POA: Diagnosis not present

## 2016-08-09 DIAGNOSIS — J441 Chronic obstructive pulmonary disease with (acute) exacerbation: Secondary | ICD-10-CM | POA: Diagnosis not present

## 2016-08-09 DIAGNOSIS — R509 Fever, unspecified: Secondary | ICD-10-CM

## 2016-08-09 DIAGNOSIS — I10 Essential (primary) hypertension: Secondary | ICD-10-CM

## 2016-08-09 DIAGNOSIS — K219 Gastro-esophageal reflux disease without esophagitis: Secondary | ICD-10-CM

## 2016-08-09 DIAGNOSIS — E785 Hyperlipidemia, unspecified: Secondary | ICD-10-CM | POA: Diagnosis not present

## 2016-08-09 DIAGNOSIS — M1990 Unspecified osteoarthritis, unspecified site: Secondary | ICD-10-CM | POA: Diagnosis not present

## 2016-08-09 DIAGNOSIS — E1142 Type 2 diabetes mellitus with diabetic polyneuropathy: Secondary | ICD-10-CM

## 2016-08-09 DIAGNOSIS — R609 Edema, unspecified: Secondary | ICD-10-CM | POA: Diagnosis not present

## 2016-08-09 MED ORDER — ALBUTEROL SULFATE (2.5 MG/3ML) 0.083% IN NEBU: 2.5000 mg | INHALATION_SOLUTION | Freq: Four times a day (QID) | RESPIRATORY_TRACT | 12 refills | Status: DC | PRN

## 2016-08-09 MED ORDER — BUDESONIDE-FORMOTEROL FUMARATE 160-4.5 MCG/ACT IN AERO: INHALATION_SPRAY | RESPIRATORY_TRACT | 4 refills | Status: DC

## 2016-08-09 MED ORDER — DOXYCYCLINE HYCLATE 100 MG PO TABS: 100.0000 mg | ORAL_TABLET | Freq: Two times a day (BID) | ORAL | 0 refills | Status: DC

## 2016-08-09 NOTE — Progress Notes (Signed)
Subjective:    Patient ID: Jennifer Rasmussen, female    DOB: [birth year], 66 y.o.   MRN: [id number]  PT presents to the office today for chronic follow up.  Hypertension  This is a chronic problem. The current episode started more than 1 year ago. The problem has been resolved since onset. The problem is controlled. Associated symptoms include peripheral edema and shortness of breath ("when walking"). Pertinent negatives include no blurred vision, chest pain, headaches or palpitations. Risk factors for coronary artery disease include dyslipidemia, diabetes mellitus, family history, obesity, sedentary lifestyle and post-menopausal state. Past treatments include ACE inhibitors and beta blockers. The current treatment provides moderate improvement. There is no history of kidney disease, CAD/MI, CVA, heart failure or PVD. Identifiable causes of hypertension include a thyroid problem.  Hyperlipidemia  This is a chronic problem. The current episode started more than 1 year ago. The problem is controlled. Recent lipid tests were reviewed and are normal. Exacerbating diseases include diabetes, hypothyroidism and obesity. Factors aggravating her hyperlipidemia include smoking and fatty foods. Associated symptoms include shortness of breath ("when walking"). Pertinent negatives include no chest pain, leg pain or myalgias. Current antihyperlipidemic treatment includes statins. Risk factors for coronary artery disease include diabetes mellitus, dyslipidemia, hypertension, obesity, post-menopausal and family history.  Gastroesophageal Reflux  She complains of belching. She reports no chest pain, no coughing, no globus sensation, no heartburn, no hoarse voice or no sore throat. This is a chronic problem. The current episode started more than 1 year ago. The problem occurs rarely. The problem has been resolved. The symptoms are aggravated by lying down and certain foods. Risk factors include obesity. She has tried a PPI  for the symptoms. The treatment provided moderate relief.  Diabetes  She presents for her follow-up diabetic visit. She has type 2 diabetes mellitus. Her disease course has been stable. Pertinent negatives for hypoglycemia include no confusion, dizziness, headaches, mood changes or nervousness/anxiousness. Pertinent negatives for diabetes include no blurred vision, no chest pain, no foot paresthesias, no foot ulcerations and no visual change. Pertinent negatives for hypoglycemia complications include no blackouts, no hospitalization and no nocturnal hypoglycemia. Symptoms are worsening. Diabetic complications include peripheral neuropathy (in "hands"). Pertinent negatives for diabetic complications include no CVA, heart disease, nephropathy or PVD. Risk factors for coronary artery disease include diabetes mellitus, dyslipidemia, hypertension, obesity, post-menopausal, family history, stress and tobacco exposure. Current diabetic treatment includes oral agent (dual therapy) and oral agent (monotherapy). She is compliant with treatment some of the time. She is following a generally unhealthy diet. Her breakfast blood glucose range is generally 110-130 mg/dl. (Does not take blood glucose regularly ) An ACE inhibitor/angiotensin II receptor blocker is being taken. Eye exam is current.  Thyroid Problem  Presents for follow-up visit. Symptoms include dry skin and hair loss. Patient reports no anxiety, constipation, depressed mood, diarrhea, hoarse voice, leg swelling, nail problem, palpitations or visual change. The symptoms have been stable. Past treatments include levothyroxine. The treatment provided significant relief. Her past medical history is significant for diabetes and hyperlipidemia. There is no history of heart failure.  Insomnia  Primary symptoms: fragmented sleep, difficulty falling asleep, frequent awakening.  The current episode started more than one year. The onset quality is gradual. The problem  occurs intermittently. The problem has been waxing and waning since onset. Past treatments include meditation. The treatment provided mild relief.  Arthritis  Presents for follow-up visit. She complains of pain and joint swelling. The symptoms have been worsening. Affected locations  include the left hip and right hip. Her pain is at a severity of 10/10. Pertinent negatives include no diarrhea. Side effects of treatment include joint pain.  Peripheral Edema PT currently talking lasix 20 mg daily, wears compression hose daily, use full SCD's for every other day. PT states her edema is stable.  COPD Pt currently taking Symbicort BID and uses nebulizer as needed. Pt states if she has to walk any distance she becomes SOB.  Diabetic Neuropathy  PT states this is stable. PT currently taking gabapentin 400 mg TID.   Pain assessment: Cause of pain- Right and Left hip Pain on scale of 1-10- 10 Frequency- Constant What increases pain- Walking What makes pain Better-Resting and pain medication  Current medications- Ultram 100 mg every 8 hours as needed Effectiveness of current meds-Stable   Pill count performed-No Urine drug screen- No Was the [place] reviewed- Yes  If yes were their any concerning findings? - Has only received controlled medications from myself.    Review of Systems  Constitutional: Negative.   HENT: Negative.  Negative for hoarse voice and sore throat.   Eyes: Negative.  Negative for blurred vision.  Respiratory: Positive for shortness of breath ("when walking"). Negative for cough.   Cardiovascular: Negative.  Negative for chest pain and palpitations.  Gastrointestinal: Negative for constipation, diarrhea and heartburn.  Endocrine: Negative.   Genitourinary: Negative.   Musculoskeletal: Positive for arthritis and joint swelling. Negative for myalgias.  Neurological: Negative.  Negative for dizziness and headaches.  Hematological: Negative.   Psychiatric/Behavioral:  Negative for confusion. The patient has insomnia. The patient is not nervous/anxious.   All other systems reviewed and are negative.      Objective:   Physical Exam  Constitutional: She is oriented to person, place, and time. She appears well-developed and well-nourished. No distress.  Morbid obese  HENT:  Head: Normocephalic and atraumatic.  Right Ear: External ear normal.  Left Ear: External ear normal.  Nose: Nose normal.  Mouth/Throat: Oropharynx is clear and moist.  Eyes: Pupils are equal, round, and reactive to light.  Neck: Normal range of motion. Neck supple. No thyromegaly present.  Cardiovascular: Normal rate, regular rhythm, normal heart sounds and intact distal pulses.   No murmur heard. Pulmonary/Chest: Effort normal and breath sounds normal. No respiratory distress. She has no wheezes.  Abdominal: Soft. Bowel sounds are normal. She exhibits no distension. There is no tenderness.  Musculoskeletal: She exhibits edema (3+ in BLE). She exhibits no tenderness.  Bilateral unna boots present, pt present in wheelchair  Neurological: She is alert and oriented to person, place, and time.  Skin: Skin is warm and dry.  Psychiatric: She has a normal mood and affect. Her behavior is normal. Judgment and thought content normal.  Vitals reviewed.     BP 136/68   Pulse 96   Temp (!) 100.5 F (38.1 C) (Oral)   Ht 5' 3" (1.6 m)   Wt 297 lb 4.8 oz (134.9 kg)   LMP 10/24/2001   BMI 52.66 kg/m      Assessment & Plan:  1. Essential hypertension, benign - CMP14+EGFR  2. COPD exacerbation (HCC) - budesonide-formoterol (SYMBICORT) 160-4.5 MCG/ACT inhaler; USE 2 INHALATIONS TWICE A DAY  Dispense: 3 Inhaler; Refill: 4 - albuterol (PROVENTIL) (2.5 MG/3ML) 0.083% nebulizer solution; Take 3 mLs (2.5 mg total) by nebulization every 6 (six) hours as needed for wheezing or shortness of breath.  Dispense: 75 mL; Refill: 12 - CMP14+EGFR - DG Chest 2 View; Future  3. Gastroesophageal  reflux disease, esophagitis presence not specified - CMP14+EGFR  4. Hypothyroidism, unspecified type - CMP14+EGFR - Thyroid Panel With TSH  5. Diabetic peripheral neuropathy (HCC) - CMP14+EGFR  6. Type 2 diabetes mellitus with hyperglycemia, without long-term current use of insulin (HCC) - CMP14+EGFR  7. Arthritis  - CMP14+EGFR  8. Vitamin D deficiency - CMP14+EGFR - VITAMIN D 25 Hydroxy (Vit-D Deficiency, Fractures)  9. Peripheral edema  - CMP14+EGFR  10. Hyperlipidemia, unspecified hyperlipidemia type - CMP14+EGFR - Lipid panel  11. Insomnia, unspecified type  - CMP14+EGFR  12. Morbid obesity (HCC) - CMP14+EGFR  13. Fever, unspecified fever cause  - DG Chest 2 View; Future  14. Current smoker - DG Chest 2 View; Future   Continue all meds Labs pending Health Maintenance reviewed Diet and exercise encouraged RTO 3 months   Christy Hawks, FNP   

## 2016-08-09 NOTE — Patient Instructions (Signed)
Steps to Quit Smoking Smoking tobacco can be bad for your health. It can also affect almost every organ in your body. Smoking puts you and people around you at risk for many serious long-lasting (chronic) diseases. Quitting smoking is hard, but it is one of the best things that you can do for your health. It is never too late to quit. What are the benefits of quitting smoking? When you quit smoking, you lower your risk for getting serious diseases and conditions. They can include:  Lung cancer or lung disease.  Heart disease.  Stroke.  Heart attack.  Not being able to have children (infertility).  Weak bones (osteoporosis) and broken bones (fractures). If you have coughing, wheezing, and shortness of breath, those symptoms may get better when you quit. You may also get sick less often. If you are pregnant, quitting smoking can help to lower your chances of having a baby of low birth weight. What can I do to help me quit smoking? Talk with your doctor about what can help you quit smoking. Some things you can do (strategies) include:  Quitting smoking totally, instead of slowly cutting back how much you smoke over a period of time.  Going to in-person counseling. You are more likely to quit if you go to many counseling sessions.  Using resources and support systems, such as:  Online chats with a counselor.  Phone quitlines.  Printed self-help materials.  Support groups or group counseling.  Text messaging programs.  Mobile phone apps or applications.  Taking medicines. Some of these medicines may have nicotine in them. If you are pregnant or breastfeeding, do not take any medicines to quit smoking unless your doctor says it is okay. Talk with your doctor about counseling or other things that can help you. Talk with your doctor about using more than one strategy at the same time, such as taking medicines while you are also going to in-person counseling. This can help make quitting  easier. What things can I do to make it easier to quit? Quitting smoking might feel very hard at first, but there is a lot that you can do to make it easier. Take these steps:  Talk to your family and friends. Ask them to support and encourage you.  Call phone quitlines, reach out to support groups, or work with a counselor.  Ask people who smoke to not smoke around you.  Avoid places that make you want (trigger) to smoke, such as:  Bars.  Parties.  Smoke-break areas at work.  Spend time with people who do not smoke.  Lower the stress in your life. Stress can make you want to smoke. Try these things to help your stress:  Getting regular exercise.  Deep-breathing exercises.  Yoga.  Meditating.  Doing a body scan. To do this, close your eyes, focus on one area of your body at a time from head to toe, and notice which parts of your body are tense. Try to relax the muscles in those areas.  Download or buy apps on your mobile phone or tablet that can help you stick to your quit plan. There are many free apps, such as QuitGuide from the CDC (Centers for Disease Control and Prevention). You can find more support from smokefree.gov and other websites. This information is not intended to replace advice given to you by your health care provider. Make sure you discuss any questions you have with your health care provider. Document Released: 04/30/2009 Document Revised: 03/01/2016 Document   Reviewed: 11/18/2014 Elsevier Interactive Patient Education  2017 [employer]  

## 2016-08-10 LAB — CMP14+EGFR
A/G RATIO: 0.9 — AB (ref 1.2–2.2)
ALT: 6 IU/L (ref 0–32)
AST: 9 IU/L (ref 0–40)
Albumin: 3.2 g/dL — ABNORMAL LOW (ref 3.6–4.8)
Alkaline Phosphatase: 138 IU/L — ABNORMAL HIGH (ref 39–117)
BILIRUBIN TOTAL: 0.4 mg/dL (ref 0.0–1.2)
BUN/Creatinine Ratio: 17 (ref 12–28)
BUN: 15 mg/dL (ref 8–27)
CHLORIDE: 97 mmol/L (ref 96–106)
CO2: 29 mmol/L (ref 18–29)
Calcium: 9 mg/dL (ref 8.7–10.3)
Creatinine, Ser: 0.86 mg/dL (ref 0.57–1.00)
GFR calc Af Amer: 81 mL/min/{1.73_m2} (ref >59)
GFR calc non Af Amer: 71 mL/min/{1.73_m2} (ref >59)
Globulin, Total: 3.5 g/dL (ref 1.5–4.5)
Glucose: 106 mg/dL — ABNORMAL HIGH (ref 65–99)
POTASSIUM: 4.5 mmol/L (ref 3.5–5.2)
Sodium: 140 mmol/L (ref 134–144)
TOTAL PROTEIN: 6.7 g/dL (ref 6.0–8.5)

## 2016-08-10 LAB — LIPID PANEL
CHOLESTEROL TOTAL: 156 mg/dL (ref 100–199)
Chol/HDL Ratio: 3.6 ratio units (ref 0.0–4.4)
HDL: 43 mg/dL (ref >39)
LDL Calculated: 91 mg/dL (ref 0–99)
TRIGLYCERIDES: 109 mg/dL (ref 0–149)
VLDL CHOLESTEROL CAL: 22 mg/dL (ref 5–40)

## 2016-08-10 LAB — THYROID PANEL WITH TSH
FREE THYROXINE INDEX: 2.6 (ref 1.2–4.9)
T3 Uptake Ratio: 28 % (ref 24–39)
T4, Total: 9.2 ug/dL (ref 4.5–12.0)
TSH: 2.58 u[IU]/mL (ref 0.450–4.500)

## 2016-08-10 LAB — VITAMIN D 25 HYDROXY (VIT D DEFICIENCY, FRACTURES): VIT D 25 HYDROXY: 34.8 ng/mL (ref 30.0–100.0)

## 2016-10-01 ENCOUNTER — Other Ambulatory Visit: Payer: Self-pay | Admitting: Family

## 2016-10-01 DIAGNOSIS — R609 Edema, unspecified: Secondary | ICD-10-CM

## 2016-10-01 DIAGNOSIS — I10 Essential (primary) hypertension: Secondary | ICD-10-CM

## 2016-10-31 ENCOUNTER — Encounter (HOSPITAL_COMMUNITY): Payer: Self-pay

## 2016-11-03 ENCOUNTER — Other Ambulatory Visit: Payer: Self-pay | Admitting: Family

## 2016-11-03 DIAGNOSIS — M1990 Unspecified osteoarthritis, unspecified site: Secondary | ICD-10-CM

## 2016-11-03 DIAGNOSIS — E1142 Type 2 diabetes mellitus with diabetic polyneuropathy: Secondary | ICD-10-CM

## 2016-11-03 NOTE — Telephone Encounter (Signed)
Rx called to pharmacy

## 2016-11-05 ENCOUNTER — Other Ambulatory Visit: Payer: Self-pay | Admitting: Family

## 2016-11-05 DIAGNOSIS — E1142 Type 2 diabetes mellitus with diabetic polyneuropathy: Secondary | ICD-10-CM

## 2016-11-05 DIAGNOSIS — E1165 Type 2 diabetes mellitus with hyperglycemia: Secondary | ICD-10-CM

## 2016-11-05 DIAGNOSIS — E785 Hyperlipidemia, unspecified: Secondary | ICD-10-CM

## 2016-11-10 ENCOUNTER — Encounter: Payer: Self-pay | Admitting: Family

## 2016-11-10 ENCOUNTER — Ambulatory Visit (INDEPENDENT_AMBULATORY_CARE_PROVIDER_SITE_OTHER): Payer: BLUE CROSS/BLUE SHIELD | Admitting: Family

## 2016-11-10 VITALS — BP 118/68 | HR 85 | Temp 97.6°F | Ht 63.0 in | Wt 290.0 lb

## 2016-11-10 DIAGNOSIS — E1165 Type 2 diabetes mellitus with hyperglycemia: Secondary | ICD-10-CM | POA: Diagnosis not present

## 2016-11-10 DIAGNOSIS — R609 Edema, unspecified: Secondary | ICD-10-CM

## 2016-11-10 DIAGNOSIS — I10 Essential (primary) hypertension: Secondary | ICD-10-CM

## 2016-11-10 DIAGNOSIS — M1990 Unspecified osteoarthritis, unspecified site: Secondary | ICD-10-CM | POA: Diagnosis not present

## 2016-11-10 DIAGNOSIS — F17200 Nicotine dependence, unspecified, uncomplicated: Secondary | ICD-10-CM | POA: Diagnosis not present

## 2016-11-10 DIAGNOSIS — E039 Hypothyroidism, unspecified: Secondary | ICD-10-CM

## 2016-11-10 DIAGNOSIS — K219 Gastro-esophageal reflux disease without esophagitis: Secondary | ICD-10-CM | POA: Diagnosis not present

## 2016-11-10 DIAGNOSIS — E785 Hyperlipidemia, unspecified: Secondary | ICD-10-CM | POA: Diagnosis not present

## 2016-11-10 DIAGNOSIS — J441 Chronic obstructive pulmonary disease with (acute) exacerbation: Secondary | ICD-10-CM | POA: Diagnosis not present

## 2016-11-10 DIAGNOSIS — E1142 Type 2 diabetes mellitus with diabetic polyneuropathy: Secondary | ICD-10-CM

## 2016-11-10 LAB — BAYER DCA HB A1C WAIVED: HB A1C (BAYER DCA - WAIVED): 7.3 % — ABNORMAL HIGH (ref <7.0)

## 2016-11-10 NOTE — Patient Instructions (Signed)
Arthritis Arthritis is a term that is commonly used to refer to joint pain or joint disease. There are more than 100 types of arthritis. What are the causes? The most common cause of this condition is wear and tear of a joint. Other causes include:  Gout.  Inflammation of a joint.  An infection of a joint.  Sprains and other injuries near the joint.  A drug reaction or allergic reaction. In some cases, the cause may not be known. What are the signs or symptoms? The main symptom of this condition is pain in the joint with movement. Other symptoms include:  Redness, swelling, or stiffness at a joint.  Warmth coming from the joint.  Fever.  Overall feeling of illness. How is this diagnosed? This condition may be diagnosed with a physical exam and tests, including:  Blood tests.  Urine tests.  Imaging tests, such as MRI, X-rays, or a CT scan. Sometimes, fluid is removed from a joint for testing. How is this treated? Treatment for this condition may involve:  Treatment of the cause, if it is known.  Rest.  Raising (elevating) the joint.  Applying cold or hot packs to the joint.  Medicines to improve symptoms and reduce inflammation.  Injections of a steroid such as cortisone into the joint to help reduce pain and inflammation. Depending on the cause of your arthritis, you may need to make lifestyle changes to reduce stress on your joint. These changes may include exercising more and losing weight. Follow these instructions at home: Medicines   Take over-the-counter and prescription medicines only as told by your health care provider.  Do not take aspirin to relieve pain if gout is suspected. Activity   Rest your joint if told by your health care provider. Rest is important when your disease is active and your joint feels painful, swollen, or stiff.  Avoid activities that make the pain worse. It is important to balance activity with rest.  Exercise your joint  regularly with range-of-motion exercises as told by your health care provider. Try doing low-impact exercise, such as:  Swimming.  Water aerobics.  Biking.  Walking. Joint Care    If your joint is swollen, keep it elevated if told by your health care provider.  If your joint feels stiff in the morning, try taking a warm shower.  If directed, apply heat to the joint. If you have diabetes, do not apply heat without permission from your health care provider.  Put a towel between the joint and the hot pack or heating pad.  Leave the heat on the area for 20-30 minutes.  If directed, apply ice to the joint:  Put ice in a plastic bag.  Place a towel between your skin and the bag.  Leave the ice on for 20 minutes, 2-3 times per day.  Keep all follow-up visits as told by your health care provider. This is important. Contact a health care provider if:  The pain gets worse.  You have a fever. Get help right away if:  You develop severe joint pain, swelling, or redness.  Many joints become painful and swollen.  You develop severe back pain.  You develop severe weakness in your leg.  You cannot control your bladder or bowels. This information is not intended to replace advice given to you by your health care provider. Make sure you discuss any questions you have with your health care provider. Document Released: 08/11/2004 Document Revised: 12/10/2015 Document Reviewed: 09/29/2014 Elsevier Interactive Patient Education    2017 Elsevier Inc.  

## 2016-11-10 NOTE — Progress Notes (Signed)
Subjective:    Patient ID: Jennifer Rasmussen, female    DOB: [birth year], 66 y.o.   MRN: [id number]  Pt presents to the office today for chronic follow up.  Hypertension  This is a chronic problem. The current episode started more than 1 year ago. The problem has been resolved since onset. The problem is controlled. Associated symptoms include palpitations, peripheral edema and shortness of breath. Pertinent negatives include no blurred vision. Risk factors for coronary artery disease include diabetes mellitus, dyslipidemia, family history, obesity, post-menopausal state and sedentary lifestyle. Identifiable causes of hypertension include a thyroid problem.  Hyperlipidemia  This is a chronic problem. The current episode started more than 1 year ago. The problem is controlled. Recent lipid tests were reviewed and are normal. Exacerbating diseases include obesity. Factors aggravating her hyperlipidemia include smoking. Associated symptoms include shortness of breath. Current antihyperlipidemic treatment includes statins. The current treatment provides significant improvement of lipids.  Diabetes  She presents for her follow-up diabetic visit. She has type 2 diabetes mellitus. Her disease course has been stable. Pertinent negatives for diabetes include no blurred vision and no visual change. There are no hypoglycemic complications. Risk factors for coronary artery disease include dyslipidemia, diabetes mellitus, hypertension, obesity, post-menopausal, sedentary lifestyle and tobacco exposure. Her breakfast blood glucose range is generally 140-180 mg/dl. Eye exam is not current.  Thyroid Problem  Presents for follow-up visit. Symptoms include constipation and palpitations. Patient reports no depressed mood or visual change. Her past medical history is significant for hyperlipidemia.  Gastroesophageal Reflux  She reports no belching or no heartburn. This is a chronic problem. Risk factors include  smoking/tobacco exposure and obesity. She has tried a PPI for the symptoms.  Arthritis  Presents for follow-up visit. She complains of pain and stiffness. Affected locations include the right knee, left hip and right hip. Her pain is at a severity of 9/10.  Peripheral Edema PT currently talking lasix 20 mg daily, wears compression hose daily, use full SCD's for every other day. PT states her edema is stable.  COPD Pt currently taking Symbicort BID and uses nebulizer as needed. Pt states if she has to walk any distance she becomes SOB.  Diabetic Neuropathy  PT states this is stable. PT currently taking gabapentin 400 mg TID.     Review of Systems  Eyes: Negative for blurred vision.  Respiratory: Positive for shortness of breath.   Cardiovascular: Positive for palpitations.  Gastrointestinal: Positive for constipation. Negative for heartburn.  Musculoskeletal: Positive for arthritis and stiffness.  All other systems reviewed and are negative.      Objective:   Physical Exam  Constitutional: She is oriented to person, place, and time. She appears well-developed and well-nourished. No distress.  Morbid obese   HENT:  Head: Normocephalic and atraumatic.  Right Ear: External ear normal.  Left Ear: External ear normal.  Nose: Nose normal.  Mouth/Throat: Posterior oropharyngeal erythema present.  Eyes: Pupils are equal, round, and reactive to light.  Neck: Normal range of motion. Neck supple. No thyromegaly present.  Cardiovascular: Normal rate, regular rhythm, normal heart sounds and intact distal pulses.   No murmur heard. Pulmonary/Chest: Effort normal and breath sounds normal. No respiratory distress. She has no wheezes.  Abdominal: Soft. Bowel sounds are normal. She exhibits no distension. There is no tenderness.  Musculoskeletal: Normal range of motion. She exhibits edema (2+ in BLE). She exhibits no tenderness.  Pt in wheelchair  Neurological: She is alert and oriented to  person, place, and  time. She has normal reflexes. No cranial nerve deficit.  Skin: Skin is warm and dry.  Psychiatric: She has a normal mood and affect. Her behavior is normal. Judgment and thought content normal.  Vitals reviewed.     BP 118/68   Pulse 85   Temp 97.6 F (36.4 C) (Oral)   Ht '5\' 3"'$  (1.6 m)   Wt 290 lb (131.5 kg)   LMP 10/24/2001   BMI 51.37 kg/m   Diabetic Foot Exam - Simple   Simple Foot Form Diabetic Foot exam was performed with the following findings:  Yes 11/10/2016 11:20 AM  Visual Inspection No deformities, no ulcerations, no other skin breakdown bilaterally:  Yes Sensation Testing Intact to touch and monofilament testing bilaterally:  Yes Pulse Check Posterior Tibialis and Dorsalis pulse intact bilaterally:  Yes Comments Moderate swelling present        Assessment & Plan:  1. Essential hypertension, benign - CMP14+EGFR  2. COPD exacerbation ([place]) - CMP14+EGFR  3. Gastroesophageal reflux disease, esophagitis presence not specified - CMP14+EGFR  4. Hypothyroidism, unspecified type - CMP14+EGFR - Thyroid Panel With TSH  5. Diabetic peripheral neuropathy (HCC) - CMP14+EGFR - Bayer DCA Hb A1c Waived  6. Type 2 diabetes mellitus with hyperglycemia, without long-term current use of insulin (HCC) - CMP14+EGFR - Bayer DCA Hb A1c Waived  7. Arthritis  - CMP14+EGFR  8. Peripheral edema - CMP14+EGFR  9. Morbid obesity ([place]) - CMP14+EGFR  10. Hyperlipidemia, unspecified hyperlipidemia type  - CMP14+EGFR - Lipid panel  11. Current smoker  - CMP14+EGFR   Continue all meds Labs pending Health Maintenance reviewed Diet and exercise encouraged RTO 3 months  Evelina Dun, FNP

## 2016-11-11 LAB — CMP14+EGFR
A/G RATIO: 0.9 — AB (ref 1.2–2.2)
ALT: 5 IU/L (ref 0–32)
AST: 7 IU/L (ref 0–40)
Albumin: 3.4 g/dL — ABNORMAL LOW (ref 3.6–4.8)
Alkaline Phosphatase: 157 IU/L — ABNORMAL HIGH (ref 39–117)
BUN/Creatinine Ratio: 13 (ref 12–28)
BUN: 12 mg/dL (ref 8–27)
Bilirubin Total: 0.4 mg/dL (ref 0.0–1.2)
CALCIUM: 9.3 mg/dL (ref 8.7–10.3)
CO2: 29 mmol/L (ref 18–29)
Chloride: 96 mmol/L (ref 96–106)
Creatinine, Ser: 0.9 mg/dL (ref 0.57–1.00)
GFR, EST AFRICAN AMERICAN: 77 mL/min/{1.73_m2} (ref >59)
GFR, EST NON AFRICAN AMERICAN: 66 mL/min/{1.73_m2} (ref >59)
GLOBULIN, TOTAL: 3.6 g/dL (ref 1.5–4.5)
Glucose: 136 mg/dL — ABNORMAL HIGH (ref 65–99)
POTASSIUM: 4.6 mmol/L (ref 3.5–5.2)
Sodium: 139 mmol/L (ref 134–144)
TOTAL PROTEIN: 7 g/dL (ref 6.0–8.5)

## 2016-11-11 LAB — THYROID PANEL WITH TSH
Free Thyroxine Index: 2.6 (ref 1.2–4.9)
T3 Uptake Ratio: 27 % (ref 24–39)
T4 TOTAL: 9.5 ug/dL (ref 4.5–12.0)
TSH: 3.2 u[IU]/mL (ref 0.450–4.500)

## 2016-11-11 LAB — LIPID PANEL
CHOL/HDL RATIO: 3.4 ratio (ref 0.0–4.4)
Cholesterol, Total: 164 mg/dL (ref 100–199)
HDL: 48 mg/dL (ref >39)
LDL Calculated: 92 mg/dL (ref 0–99)
Triglycerides: 122 mg/dL (ref 0–149)
VLDL Cholesterol Cal: 24 mg/dL (ref 5–40)

## 2016-12-11 ENCOUNTER — Other Ambulatory Visit: Payer: Self-pay | Admitting: Family

## 2016-12-11 DIAGNOSIS — I10 Essential (primary) hypertension: Secondary | ICD-10-CM

## 2016-12-11 DIAGNOSIS — J441 Chronic obstructive pulmonary disease with (acute) exacerbation: Secondary | ICD-10-CM

## 2016-12-11 DIAGNOSIS — R609 Edema, unspecified: Secondary | ICD-10-CM

## 2016-12-11 DIAGNOSIS — E785 Hyperlipidemia, unspecified: Secondary | ICD-10-CM

## 2016-12-16 ENCOUNTER — Emergency Department (HOSPITAL_COMMUNITY)
Admission: EM | Admit: 2016-12-16 | Discharge: 2016-12-17 | Disposition: A | Discharge status: Home / Self Care | Payer: BLUE CROSS/BLUE SHIELD | Attending: Emergency Medicine | Admitting: Emergency Medicine

## 2016-12-16 ENCOUNTER — Encounter (HOSPITAL_COMMUNITY): Payer: Self-pay | Admitting: Emergency Medicine

## 2016-12-16 DIAGNOSIS — T7840XA Allergy, unspecified, initial encounter: Secondary | ICD-10-CM | POA: Insufficient documentation

## 2016-12-16 DIAGNOSIS — Z7982 Long term (current) use of aspirin: Secondary | ICD-10-CM | POA: Diagnosis not present

## 2016-12-16 DIAGNOSIS — E039 Hypothyroidism, unspecified: Secondary | ICD-10-CM | POA: Insufficient documentation

## 2016-12-16 DIAGNOSIS — J449 Chronic obstructive pulmonary disease, unspecified: Secondary | ICD-10-CM | POA: Insufficient documentation

## 2016-12-16 DIAGNOSIS — E119 Type 2 diabetes mellitus without complications: Secondary | ICD-10-CM | POA: Insufficient documentation

## 2016-12-16 DIAGNOSIS — Z79899 Other long term (current) drug therapy: Secondary | ICD-10-CM | POA: Insufficient documentation

## 2016-12-16 DIAGNOSIS — F17210 Nicotine dependence, cigarettes, uncomplicated: Secondary | ICD-10-CM | POA: Insufficient documentation

## 2016-12-16 DIAGNOSIS — R112 Nausea with vomiting, unspecified: Secondary | ICD-10-CM | POA: Diagnosis not present

## 2016-12-16 DIAGNOSIS — Z96653 Presence of artificial knee joint, bilateral: Secondary | ICD-10-CM | POA: Insufficient documentation

## 2016-12-16 DIAGNOSIS — Z7951 Long term (current) use of inhaled steroids: Secondary | ICD-10-CM | POA: Insufficient documentation

## 2016-12-16 DIAGNOSIS — I10 Essential (primary) hypertension: Secondary | ICD-10-CM | POA: Diagnosis not present

## 2016-12-16 DIAGNOSIS — R220 Localized swelling, mass and lump, head: Secondary | ICD-10-CM | POA: Diagnosis present

## 2016-12-16 NOTE — ED Provider Notes (Signed)
[facility] DEPT Provider Note   CSN: [id number] Arrival date & time: 12/16/16  2329     History   Chief Complaint Chief Complaint  Patient presents with  . Allergic Reaction    HPI Jennifer Rasmussen is a 66 y.o. female.  The history is provided by the patient.  She was eating dinner when she developed nausea and then vomited. Following that, she ate some popcorn and noted that the area under her tongue swelled. This was at about 7:30 PM. Swelling is actually decreasing, but she called for EMS who arrived and gave her an injection of diphenhydramine. She denies difficulty breathing or swallowing. She's never had a reaction like this before.  Past Medical History:  Diagnosis Date  . COPD (chronic obstructive pulmonary disease) with chronic bronchitis (HCC)   . Diabetes mellitus, type 2 (HCC)   . DJD (degenerative joint disease)   . H pylori ulcer   . Hyperlipidemia   . Hypertension   . Thyroid disease     Patient Active Problem List   Diagnosis Date Noted  . Current smoker 08/09/2016  . Insomnia 04/19/2016  . Diabetic peripheral neuropathy (HCC) 04/19/2016  . GERD (gastroesophageal reflux disease) 09/22/2014  . Vitamin D deficiency 09/22/2014  . Hyperlipidemia 09/22/2014  . COPD exacerbation (HCC) 10/24/2012  . Essential hypertension, benign 10/24/2012  . Diabetes mellitus (HCC) 10/24/2012  . Hypothyroidism 10/24/2012  . Morbid obesity (HCC) 10/24/2012  . Peripheral edema 10/24/2012  . Arthritis 04/22/2008  . LOW BACK PAIN 04/22/2008    Past Surgical History:  Procedure Laterality Date  . LAPAROSCOPIC GASTRIC BANDING    . REPLACEMENT TOTAL KNEE BILATERAL      OB History    No data available       Home Medications    Prior to Admission medications   Medication Sig Start Date End Date Taking? Authorizing Provider  albuterol (PROVENTIL) (2.5 MG/3ML) 0.083% nebulizer solution INHALE 1 VIAL VIA NEBULIZER EVERY 6 HOURS AS NEEDED FOR WHEEZING OR SHORTNESS OF   BREATH. 12/13/16   Jannifer Rodney A, FNP  aspirin 81 MG tablet Take 81 mg by mouth daily.      [provider]  budesonide-formoterol (SYMBICORT) 160-4.5 MCG/ACT inhaler USE 2 INHALATIONS TWICE A DAY 08/09/16   Hawks, Christy A, FNP  carvedilol (COREG) 12.5 MG tablet Take 1 tablet (12.5 mg total) by mouth daily. 04/19/16   Jannifer Rodney A, FNP  Cholecalciferol (VITAMIN D) 2000 UNITS tablet Take 2,000 Units by mouth daily.      [provider]  furosemide (LASIX) 20 MG tablet TAKE 1 TABLET BY MOUTH  DAILY 12/13/16   Jannifer Rodney A, FNP  gabapentin (NEURONTIN) 400 MG capsule TAKE 1 CAPSULE BY MOUTH 3  TIMES DAILY 11/07/16   Jannifer Rodney A, FNP  glimepiride (AMARYL) 4 MG tablet TAKE 2 TABLETS BY MOUTH  DAILY WITH BREAKFAST 11/07/16   Jannifer Rodney A, FNP  glucose blood (ONE TOUCH ULTRA TEST) test strip CHECK FASTING BLOOD SUGAR EVERY MORNING  E11.9 07/23/14   Ernestina Penna, MD  ipratropium-albuterol (DUONEB) 0.5-2.5 (3) MG/3ML SOLN Take 3 mLs by nebulization 4 (four) times daily as needed.      [provider]  levothyroxine (SYNTHROID, LEVOTHROID) 50 MCG tablet Take 1 tablet (50 mcg total) by mouth daily. 04/19/16   Jannifer Rodney A, FNP  lisinopril (PRINIVIL,ZESTRIL) 10 MG tablet TAKE 1 TABLET BY MOUTH  DAILY 12/13/16   Jannifer Rodney A, FNP  meloxicam (MOBIC) 15 MG tablet Take 1  tablet (15 mg total) by mouth daily. 04/19/16   Jannifer RodneyHawks, Christy A, FNP  ranitidine (ZANTAC) 150 MG tablet Take 150 mg by mouth daily.      [provider]  simvastatin (ZOCOR) 20 MG tablet TAKE 1 TABLET BY MOUTH  DAILY AT 6 PM 12/13/16   Jannifer RodneyHawks, Christy A, FNP  traMADol (ULTRAM) 50 MG tablet TAKE 2 TABLETS EVERY 8 HOURS AS NEEDED 11/03/16   Jannifer RodneyHawks, Christy A, FNP  triamcinolone cream (KENALOG) 0.1 % Apply 1 application topically 2 (two) times daily. 11/04/14   Ernestina PennaMoore, Donald W, MD    Family History Family History  Problem Relation Age of Onset  . Heart disease Father   . Healthy Sister   .  Healthy Brother   . Healthy Sister     Social History Social History  Substance Use Topics  . Smoking status: Current Every Day Smoker    Packs/day: 0.50    Types: Cigarettes  . Smokeless tobacco: Never Used  . Alcohol use No     Allergies   Patient has no known allergies.   Review of Systems Review of Systems  All other systems reviewed and are negative.    Physical Exam Updated Vital Signs BP (!) 127/59 (BP Location: Left Arm)   Pulse 81   Temp 98.9 F (37.2 C) (Oral)   Resp 20   Ht 5\' 3"  (1.6 m)   Wt 131.5 kg (290 lb)   LMP 10/24/2001   SpO2 98%   BMI 51.37 kg/m   Physical Exam  Nursing note and vitals reviewed.  66 year old female, resting comfortably and in no acute distress. Vital signs are normal. Oxygen saturation is 98%, which is normal. Head is normocephalic and atraumatic. PERRLA, EOMI. There is moderate swelling of the sublingual tissue with no swelling of the tongue or lips or uvula. There is no pooling of secretions. There is no stridor. Phonation is normal. Neck is nontender and supple without adenopathy or JVD. Back is nontender and there is no CVA tenderness. Lungs are clear without rales, wheezes, or rhonchi. Chest is nontender. Heart has regular rate and rhythm without murmur. Abdomen is soft, flat, nontender without masses or hepatosplenomegaly and peristalsis is normoactive. Extremities have no cyanosis or edema, full range of motion is present. Skin is warm and dry without rash. Neurologic: Mental status is normal, cranial nerves are intact, there are no motor or sensory deficits.  ED Treatments / Results  Labs (all labs ordered are listed, but only abnormal results are displayed) Labs Reviewed  CBG MONITORING, ED - Abnormal; Notable for the following:       Result Value   Glucose-Capillary 157 (*)    All other components within normal limits   Procedures Procedures (including critical care time)  Medications Ordered in  ED Medications  predniSONE (DELTASONE) tablet 60 mg (60 mg Oral Given 12/17/16 0017)     Initial Impression / Assessment and Plan / ED Course  I have reviewed the triage vital signs and the nursing notes.  Pertinent labs & imaging results that were available during my care of the patient were reviewed by me and considered in my medical decision making (see chart for details).  Allergic reaction with swelling of sublingual tissue. Currently, no threat to her airway. She has received diphenhydramine. She'll be given a dose of prednisone and observed in the ED. Old records are reviewed, and she has no relevant past visits.  She was observed for 2 hours with no  progression of swelling. In fact, it had decreased slightly. She was felt to be safe for discharge at this point. She is discharged with prescription for prednisone and advised to use over-the-counter antihistamines for the next week. Return precautions discussed.  Final Clinical Impressions(s) / ED Diagnoses   Final diagnoses:  Allergic reaction, initial encounter    New Prescriptions New Prescriptions   PREDNISONE (DELTASONE) 20 MG TABLET    2 tabs po daily x 4 days     Dione Booze, MD 12/17/16 0206

## 2016-12-16 NOTE — ED Triage Notes (Addendum)
Pt states she had dinner (frozen spicy stir-fry), after about 5 bites felt nauseous and vomited around 1930, felt relief and ate popcorn, mouth started swelling under tongue around 2030-2100, pt was given 50mg  Benadryl by EMS en route IM injection

## 2016-12-17 DIAGNOSIS — T7840XA Allergy, unspecified, initial encounter: Secondary | ICD-10-CM | POA: Diagnosis not present

## 2016-12-17 LAB — CBG MONITORING, ED: GLUCOSE-CAPILLARY: 157 mg/dL — AB (ref 65–99)

## 2016-12-17 MED ORDER — PREDNISONE 20 MG PO TABS: ORAL_TABLET | ORAL | 0 refills | Status: DC

## 2016-12-17 MED ORDER — PREDNISONE 50 MG PO TABS
60.0000 mg | ORAL_TABLET | Freq: Once | ORAL | Status: AC
  Administered 2016-12-17: 60 mg via ORAL
  Filled 2016-12-17: qty 1

## 2016-12-17 NOTE — Discharge Instructions (Signed)
Take loratadine (Claritin) or cetirizine (Zyrtec) once a day for the next week. Take diphenhydramine (Benadryl) every four hours as needed for itching or swelling. Return if swelling is getting worse, or if you start having difficulty breathing or swallowing.

## 2017-02-03 ENCOUNTER — Other Ambulatory Visit: Payer: Self-pay | Admitting: Family

## 2017-02-03 DIAGNOSIS — E1142 Type 2 diabetes mellitus with diabetic polyneuropathy: Secondary | ICD-10-CM

## 2017-02-10 ENCOUNTER — Ambulatory Visit: Payer: BLUE CROSS/BLUE SHIELD | Admitting: Family

## 2017-02-24 LAB — HM DIABETES EYE EXAM

## 2017-03-02 ENCOUNTER — Encounter: Payer: Self-pay | Admitting: Family

## 2017-03-02 ENCOUNTER — Ambulatory Visit (INDEPENDENT_AMBULATORY_CARE_PROVIDER_SITE_OTHER): Payer: BLUE CROSS/BLUE SHIELD | Admitting: Family

## 2017-03-02 VITALS — BP 111/66 | HR 66 | Temp 98.2°F

## 2017-03-02 DIAGNOSIS — G4700 Insomnia, unspecified: Secondary | ICD-10-CM | POA: Diagnosis not present

## 2017-03-02 DIAGNOSIS — G8929 Other chronic pain: Secondary | ICD-10-CM

## 2017-03-02 DIAGNOSIS — M1990 Unspecified osteoarthritis, unspecified site: Secondary | ICD-10-CM | POA: Diagnosis not present

## 2017-03-02 DIAGNOSIS — F17200 Nicotine dependence, unspecified, uncomplicated: Secondary | ICD-10-CM | POA: Diagnosis not present

## 2017-03-02 DIAGNOSIS — I10 Essential (primary) hypertension: Secondary | ICD-10-CM | POA: Diagnosis not present

## 2017-03-02 DIAGNOSIS — E039 Hypothyroidism, unspecified: Secondary | ICD-10-CM | POA: Diagnosis not present

## 2017-03-02 DIAGNOSIS — E785 Hyperlipidemia, unspecified: Secondary | ICD-10-CM

## 2017-03-02 DIAGNOSIS — M545 Low back pain, unspecified: Secondary | ICD-10-CM

## 2017-03-02 DIAGNOSIS — J441 Chronic obstructive pulmonary disease with (acute) exacerbation: Secondary | ICD-10-CM

## 2017-03-02 DIAGNOSIS — E1142 Type 2 diabetes mellitus with diabetic polyneuropathy: Secondary | ICD-10-CM

## 2017-03-02 DIAGNOSIS — E559 Vitamin D deficiency, unspecified: Secondary | ICD-10-CM

## 2017-03-02 DIAGNOSIS — K219 Gastro-esophageal reflux disease without esophagitis: Secondary | ICD-10-CM | POA: Diagnosis not present

## 2017-03-02 DIAGNOSIS — R609 Edema, unspecified: Secondary | ICD-10-CM

## 2017-03-02 DIAGNOSIS — E1165 Type 2 diabetes mellitus with hyperglycemia: Secondary | ICD-10-CM

## 2017-03-02 LAB — BAYER DCA HB A1C WAIVED: HB A1C: 9.4 % — AB (ref <7.0)

## 2017-03-02 MED ORDER — GABAPENTIN 600 MG PO TABS: 600.0000 mg | ORAL_TABLET | Freq: Three times a day (TID) | ORAL | 1 refills | Status: DC

## 2017-03-02 MED ORDER — TRAMADOL HCL 50 MG PO TABS
ORAL_TABLET | ORAL | 3 refills | Status: DC
Start: 2017-03-02 — End: 2017-06-05

## 2017-03-02 NOTE — Patient Instructions (Signed)
Diabetes Mellitus and Food It is important for you to manage your blood sugar (glucose) level. Your blood glucose level can be greatly affected by what you eat. Eating healthier foods in the appropriate amounts throughout the day at about the same time each day will help you control your blood glucose level. It can also help slow or prevent worsening of your diabetes mellitus. Healthy eating may even help you improve the level of your blood pressure and reach or maintain a healthy weight. General recommendations for healthful eating and cooking habits include:  Eating meals and snacks regularly. Avoid going long periods of time without eating to lose weight.  Eating a diet that consists mainly of plant-based foods, such as fruits, vegetables, nuts, legumes, and whole grains.  Using low-heat cooking methods, such as baking, instead of high-heat cooking methods, such as deep frying.  Work with your dietitian to make sure you understand how to use the Nutrition Facts information on food labels. How can food affect me? Carbohydrates Carbohydrates affect your blood glucose level more than any other type of food. Your dietitian will help you determine how many carbohydrates to eat at each meal and teach you how to count carbohydrates. Counting carbohydrates is important to keep your blood glucose at a healthy level, especially if you are using insulin or taking certain medicines for diabetes mellitus. Alcohol Alcohol can cause sudden decreases in blood glucose (hypoglycemia), especially if you use insulin or take certain medicines for diabetes mellitus. Hypoglycemia can be a life-threatening condition. Symptoms of hypoglycemia (sleepiness, dizziness, and disorientation) are similar to symptoms of having too much alcohol. If your health care provider has given you approval to drink alcohol, do so in moderation and use the following guidelines:  Women should not have more than one drink per day, and men  should not have more than two drinks per day. One drink is equal to: ? 12 oz of beer. ? 5 oz of wine. ? 1 oz of hard liquor.  Do not drink on an empty stomach.  Keep yourself hydrated. Have water, diet soda, or unsweetened iced tea.  Regular soda, juice, and other mixers might contain a lot of carbohydrates and should be counted.  What foods are not recommended? As you make food choices, it is important to remember that all foods are not the same. Some foods have fewer nutrients per serving than other foods, even though they might have the same number of calories or carbohydrates. It is difficult to get your body what it needs when you eat foods with fewer nutrients. Examples of foods that you should avoid that are high in calories and carbohydrates but low in nutrients include:  Trans fats (most processed foods list trans fats on the Nutrition Facts label).  Regular soda.  Juice.  Candy.  Sweets, such as cake, pie, doughnuts, and cookies.  Fried foods.  What foods can I eat? Eat nutrient-rich foods, which will nourish your body and keep you healthy. The food you should eat also will depend on several factors, including:  The calories you need.  The medicines you take.  Your weight.  Your blood glucose level.  Your blood pressure level.  Your cholesterol level.  You should eat a variety of foods, including:  Protein. ? Lean cuts of meat. ? Proteins low in saturated fats, such as fish, egg whites, and beans. Avoid processed meats.  Fruits and vegetables. ? Fruits and vegetables that may help control blood glucose levels, such as apples,   mangoes, and yams.  Dairy products. ? Choose fat-free or low-fat dairy products, such as milk, yogurt, and cheese.  Grains, bread, pasta, and rice. ? Choose whole grain products, such as multigrain bread, whole oats, and brown rice. These foods may help control blood pressure.  Fats. ? Foods containing healthful fats, such as  nuts, avocado, olive oil, canola oil, and fish.  Does everyone with diabetes mellitus have the same meal plan? Because every person with diabetes mellitus is different, there is not one meal plan that works for everyone. It is very important that you meet with a dietitian who will help you create a meal plan that is just right for you. This information is not intended to replace advice given to you by your health care provider. Make sure you discuss any questions you have with your health care provider. Document Released: 03/31/2005 Document Revised: 12/10/2015 Document Reviewed: 05/31/2013 Elsevier Interactive Patient Education  2017 [employer]  

## 2017-03-02 NOTE — Addendum Note (Signed)
Addended by: Jannifer RodneyHAWKS, Ruvi Fullenwider A on: 03/02/2017 11:23 AM   Modules accepted: Orders

## 2017-03-02 NOTE — Progress Notes (Signed)
Subjective:    Patient ID: Jennifer Rasmussen, female    DOB: 10-16-1950, 66 y.o.   MRN: [id number]  Pt presents to the office today for chronic follow up.  Diabetes  She presents for her follow-up diabetic visit. She has type 2 diabetes mellitus. Her disease course has been stable. There are no hypoglycemic associated symptoms. Pertinent negatives for hypoglycemia include no sleepiness. Associated symptoms include fatigue and foot paresthesias. Pertinent negatives for diabetes include no blurred vision and no visual change. There are no hypoglycemic complications. Symptoms are stable. Pertinent negatives for diabetic complications include no CVA. Risk factors for coronary artery disease include diabetes mellitus, dyslipidemia, family history, obesity, hypertension, post-menopausal and sedentary lifestyle. Her weight is stable. She is following a generally unhealthy diet. Her breakfast blood glucose range is generally 180-200 mg/dl. Eye exam is current.  Hypertension  This is a chronic problem. The current episode started more than 1 year ago. The problem has been resolved since onset. The problem is controlled. Associated symptoms include malaise/fatigue, peripheral edema and shortness of breath. Pertinent negatives include no blurred vision. Risk factors for coronary artery disease include diabetes mellitus, dyslipidemia, family history, obesity, sedentary lifestyle and post-menopausal state. The current treatment provides moderate improvement. There is no history of CAD/MI or CVA. Identifiable causes of hypertension include a thyroid problem.  Back Pain  This is a chronic problem. The current episode started more than 1 year ago. The problem occurs intermittently. The problem has been waxing and waning since onset. The pain is present in the lumbar spine. The quality of the pain is described as aching. The pain is moderate. The symptoms are aggravated by bending and standing. Pertinent negatives include  no bladder incontinence or bowel incontinence. Risk factors include obesity and sedentary lifestyle. She has tried bed rest and ice for the symptoms. The treatment provided mild relief.  Insomnia  Primary symptoms: malaise/fatigue.  The current episode started more than one year. The onset quality is gradual. The problem has been waxing and waning since onset.  Arthritis  Presents for follow-up visit. She complains of pain and stiffness. The symptoms have been stable. Associated symptoms include fatigue.  Thyroid Problem  Presents for follow-up visit. Symptoms include fatigue. Patient reports no constipation, depressed mood or visual change. The symptoms have been stable.  Gastroesophageal Reflux  She reports no belching or no heartburn. This is a chronic problem. The current episode started more than 1 year ago. The problem occurs frequently. Associated symptoms include fatigue. She has tried a PPI for the symptoms. The treatment provided moderate relief.  Diabetic Neuropathy Has bilateral numbness in hands that come and goes.  COPD Taking Symbicort BID and uses albuterol as needed. Doing well.   Review of Systems  Constitutional: Positive for fatigue and malaise/fatigue.  Eyes: Negative for blurred vision.  Respiratory: Positive for shortness of breath.   Gastrointestinal: Negative for bowel incontinence, constipation and heartburn.  Genitourinary: Negative for bladder incontinence.  Musculoskeletal: Positive for arthritis, back pain and stiffness.  Psychiatric/Behavioral: The patient has insomnia.   All other systems reviewed and are negative.      Objective:   Physical Exam  Constitutional: She is oriented to person, place, and time. She appears well-developed and well-nourished. No distress.  HENT:  Head: Normocephalic and atraumatic.  Right Ear: External ear normal.  Left Ear: External ear normal.  Nose: Nose normal.  Mouth/Throat: Oropharynx is clear and moist.  Eyes:  Pupils are equal, round, and reactive to light.  Neck: Normal range of motion. Neck supple. No thyromegaly present.  Cardiovascular: Normal rate, regular rhythm, normal heart sounds and intact distal pulses.   No murmur heard. Pulmonary/Chest: Effort normal and breath sounds normal. No respiratory distress. She has no wheezes.  Abdominal: Soft. Bowel sounds are normal. She exhibits no distension. There is no tenderness.  Musculoskeletal: Normal range of motion. She exhibits no edema or tenderness.  Neurological: She is alert and oriented to person, place, and time.  Skin: Skin is warm and dry.  Psychiatric: She has a normal mood and affect. Her behavior is normal. Judgment and thought content normal.  Vitals reviewed.   BP 111/66   Pulse 66   Temp 98.2 F (36.8 C) (Oral)   LMP 10/24/2001      Assessment & Plan:  1. Essential hypertension, benign - CMP14+EGFR  2. COPD exacerbation ([place]) - CMP14+EGFR  3. Gastroesophageal reflux disease, esophagitis presence not specified - CMP14+EGFR  4. Hypothyroidism, unspecified type - CMP14+EGFR - TSH  5. Type 2 diabetes mellitus with hyperglycemia, without long-term current use of insulin (HCC)  - CMP14+EGFR - Bayer DCA Hb A1c Waived - Microalbumin / creatinine urine ratio  6. Diabetic peripheral neuropathy (HCC) - CMP14+EGFR - Bayer DCA Hb A1c Waived - Microalbumin / creatinine urine ratio - traMADol (ULTRAM) 50 MG tablet; TAKE 2 TABLETS EVERY 8 HOURS AS NEEDED  Dispense: 180 tablet; Refill: 3 - gabapentin (NEURONTIN) 600 MG tablet; Take 1 tablet (600 mg total) by mouth 3 (three) times daily.  Dispense: 270 tablet; Refill: 1  7. Arthritis - CMP14+EGFR - traMADol (ULTRAM) 50 MG tablet; TAKE 2 TABLETS EVERY 8 HOURS AS NEEDED  Dispense: 180 tablet; Refill: 3  8. Current smoker - CMP14+EGFR  9. Hyperlipidemia, unspecified hyperlipidemia type - CMP14+EGFR - Lipid panel  10. Insomnia, unspecified type - CMP14+EGFR  11.  Chronic midline low back pain without sciatica - CMP14+EGFR  12. Morbid obesity ([place]) - CMP14+EGFR  13. Peripheral edema  - CMP14+EGFR  14. Vitamin D deficiency - CMP14+EGFR   Continue all meds Labs pending Health Maintenance reviewed Diet and exercise encouraged RTO 3 months   Evelina Dun, FNP

## 2017-03-03 ENCOUNTER — Other Ambulatory Visit: Payer: Self-pay | Admitting: Family

## 2017-03-03 LAB — CMP14+EGFR
ALT: 7 IU/L (ref 0–32)
AST: 9 IU/L (ref 0–40)
Albumin/Globulin Ratio: 1.2 (ref 1.2–2.2)
Albumin: 3.6 g/dL (ref 3.6–4.8)
Alkaline Phosphatase: 147 IU/L — ABNORMAL HIGH (ref 39–117)
BILIRUBIN TOTAL: 0.2 mg/dL (ref 0.0–1.2)
BUN / CREAT RATIO: 11 — AB (ref 12–28)
BUN: 11 mg/dL (ref 8–27)
CHLORIDE: 95 mmol/L — AB (ref 96–106)
CO2: 27 mmol/L (ref 20–29)
CREATININE: 1 mg/dL (ref 0.57–1.00)
Calcium: 9.3 mg/dL (ref 8.7–10.3)
GFR calc non Af Amer: 58 mL/min/{1.73_m2} — ABNORMAL LOW (ref >59)
GFR, EST AFRICAN AMERICAN: 67 mL/min/{1.73_m2} (ref >59)
GLUCOSE: 202 mg/dL — AB (ref 65–99)
Globulin, Total: 3.1 g/dL (ref 1.5–4.5)
Potassium: 4.7 mmol/L (ref 3.5–5.2)
SODIUM: 139 mmol/L (ref 134–144)
Total Protein: 6.7 g/dL (ref 6.0–8.5)

## 2017-03-03 LAB — LIPID PANEL
CHOLESTEROL TOTAL: 170 mg/dL (ref 100–199)
Chol/HDL Ratio: 4.6 ratio — ABNORMAL HIGH (ref 0.0–4.4)
HDL: 37 mg/dL — AB (ref >39)
LDL CALC: 102 mg/dL — AB (ref 0–99)
TRIGLYCERIDES: 156 mg/dL — AB (ref 0–149)
VLDL CHOLESTEROL CAL: 31 mg/dL (ref 5–40)

## 2017-03-03 LAB — TSH: TSH: 3.36 u[IU]/mL (ref 0.450–4.500)

## 2017-03-03 LAB — MICROALBUMIN / CREATININE URINE RATIO
Creatinine, Urine: 209.7 mg/dL
Microalb/Creat Ratio: 339.4 mg/g creat — ABNORMAL HIGH (ref 0.0–30.0)
Microalbumin, Urine: 711.7 ug/mL

## 2017-03-03 MED ORDER — DULAGLUTIDE 0.75 MG/0.5ML ~~LOC~~ SOAJ: 0.7500 mg | SUBCUTANEOUS | 3 refills | Status: DC

## 2017-03-09 ENCOUNTER — Telehealth: Payer: Self-pay | Admitting: Family

## 2017-03-09 MED ORDER — DAPAGLIFLOZIN PROPANEDIOL 5 MG PO TABS: 5.0000 mg | ORAL_TABLET | Freq: Every day | ORAL | 1 refills | Status: DC

## 2017-03-09 NOTE — Telephone Encounter (Signed)
Pt didn't want to take Trulicity due to side effects but wants to know if she can take [place]?

## 2017-03-09 NOTE — Telephone Encounter (Signed)
Farxiga Prescription sent to pharmacy   

## 2017-03-13 ENCOUNTER — Telehealth: Payer: Self-pay

## 2017-03-13 NOTE — Telephone Encounter (Signed)
Insurance denied [place]  Needs to fail one of the following   Jardiance, Synjardy and Synjardy XR

## 2017-03-14 MED ORDER — EMPAGLIFLOZIN 10 MG PO TABS: 10.0000 mg | ORAL_TABLET | Freq: Every day | ORAL | 3 refills | Status: DC

## 2017-03-14 NOTE — Telephone Encounter (Signed)
Pt aware.

## 2017-03-14 NOTE — Telephone Encounter (Signed)
Insurance denied [place]. Jardiance Prescription sent to pharmacy

## 2017-03-14 NOTE — Addendum Note (Signed)
Addended by: Jannifer Rodney A on: 03/14/2017 12:20 PM   Modules accepted: Orders

## 2017-03-21 ENCOUNTER — Other Ambulatory Visit: Payer: Self-pay | Admitting: Family

## 2017-03-21 DIAGNOSIS — E039 Hypothyroidism, unspecified: Secondary | ICD-10-CM

## 2017-04-14 ENCOUNTER — Ambulatory Visit (INDEPENDENT_AMBULATORY_CARE_PROVIDER_SITE_OTHER): Payer: BLUE CROSS/BLUE SHIELD | Admitting: Physician Assistant

## 2017-04-14 ENCOUNTER — Encounter: Payer: Self-pay | Admitting: Physician Assistant

## 2017-04-14 VITALS — BP 116/68 | HR 75 | Temp 99.6°F | Ht 63.0 in

## 2017-04-14 DIAGNOSIS — L0291 Cutaneous abscess, unspecified: Secondary | ICD-10-CM | POA: Diagnosis not present

## 2017-04-14 MED ORDER — EMPAGLIFLOZIN 10 MG PO TABS: 10.0000 mg | ORAL_TABLET | Freq: Every day | ORAL | 0 refills | Status: DC

## 2017-04-14 MED ORDER — CLINDAMYCIN HCL 300 MG PO CAPS: 300.0000 mg | ORAL_CAPSULE | Freq: Three times a day (TID) | ORAL | 0 refills | Status: DC

## 2017-04-14 NOTE — Patient Instructions (Signed)
Non-stick pads Irrigate with saline

## 2017-04-14 NOTE — Progress Notes (Signed)
BP 116/68   Pulse 75   Temp 99.6 F (37.6 C) (Oral)   Ht  (1.6 m)   LMP 10/24/2001    Subjective:    Patient ID: Jennifer Rasmussen, female    DOB: Nov 25, 1950, 66 y.o.   MRN: [id number]  HPI: Jennifer Rasmussen is a 66 y.o. female presenting on 04/14/2017 for Recurrent Skin Infections (on back busted 3-4days ago )  Patient has had abscesses in the past. This current lesion has been there for about 1 week. It lasted about 3 days ago. She denies any fever or chills. She has previously had them drained and packed for healing.  Relevant past medical, surgical, family and social history reviewed and updated as indicated. Allergies and medications reviewed and updated.  Past Medical History:  Diagnosis Date  . COPD (chronic obstructive pulmonary disease) with chronic bronchitis (HCC)   . Diabetes mellitus, type 2 (HCC)   . DJD (degenerative joint disease)   . H pylori ulcer   . Hyperlipidemia   . Hypertension   . Thyroid disease     Past Surgical History:  Procedure Laterality Date  . LAPAROSCOPIC GASTRIC BANDING    . REPLACEMENT TOTAL KNEE BILATERAL      Review of Systems  Constitutional: Negative.   HENT: Negative.   Eyes: Negative.   Respiratory: Negative.   Gastrointestinal: Negative.   Genitourinary: Negative.   Skin: Positive for color change and wound.    Allergies as of 04/14/2017   No Known Allergies     Medication List       Accurate as of 04/14/17  4:01 PM. Always use your most recent med list.          albuterol (2.5 MG/3ML) 0.083% nebulizer solution Commonly known as:  PROVENTIL INHALE 1 VIAL VIA NEBULIZER EVERY 6 HOURS AS NEEDED FOR WHEEZING OR SHORTNESS OF  BREATH.   aspirin 81 MG tablet Take 81 mg by mouth daily.   budesonide-formoterol 160-4.5 MCG/ACT inhaler Commonly known as:  SYMBICORT USE 2 INHALATIONS TWICE A DAY   carvedilol 12.5 MG tablet Commonly known as:  COREG Take 1 tablet (12.5 mg total) by mouth daily.   clindamycin 300 MG  capsule Commonly known as:  CLEOCIN Take 1 capsule (300 mg total) by mouth 3 (three) times daily.   empagliflozin 10 MG Tabs tablet Commonly known as:  JARDIANCE Take 10 mg by mouth daily.   furosemide 20 MG tablet Commonly known as:  LASIX TAKE 1 TABLET BY MOUTH  DAILY   gabapentin 600 MG tablet Commonly known as:  NEURONTIN Take 1 tablet (600 mg total) by mouth 3 (three) times daily.   glimepiride 4 MG tablet Commonly known as:  AMARYL TAKE 2 TABLETS BY MOUTH  DAILY WITH BREAKFAST   glucose blood test strip Commonly known as:  ONE TOUCH ULTRA TEST CHECK FASTING BLOOD SUGAR EVERY MORNING  E11.9   ipratropium-albuterol 0.5-2.5 (3) MG/3ML Soln Commonly known as:  DUONEB Take 3 mLs by nebulization 4 (four) times daily as needed.   levothyroxine 50 MCG tablet Commonly known as:  SYNTHROID, LEVOTHROID TAKE 1 TABLET BY MOUTH  DAILY   lisinopril 10 MG tablet Commonly known as:  PRINIVIL,ZESTRIL TAKE 1 TABLET BY MOUTH  DAILY   meloxicam 15 MG tablet Commonly known as:  MOBIC Take 1 tablet (15 mg total) by mouth daily.   ranitidine 150 MG tablet Commonly known as:  ZANTAC Take 150 mg by mouth daily.   simvastatin 20 MG tablet Commonly  known as:  ZOCOR TAKE 1 TABLET BY MOUTH  DAILY AT 6 PM   traMADol 50 MG tablet Commonly known as:  ULTRAM TAKE 2 TABLETS EVERY 8 HOURS AS NEEDED   triamcinolone cream 0.1 % Commonly known as:  KENALOG Apply 1 application topically 2 (two) times daily.   Vitamin D 2000 units tablet Take 2,000 Units by mouth daily.            Discharge Care Instructions        Start     Ordered   04/14/17 0000  clindamycin (CLEOCIN) 300 MG capsule  3 times daily    Question:  Supervising Provider  Answer:  Elenora Gamma   04/14/17 1539         Objective:    BP 116/68   Pulse 75   Temp 99.6 F (37.6 C) (Oral)   Ht  (1.6 m)   LMP 10/24/2001   No Known Allergies  Physical Exam  Constitutional: She is oriented to person,  place, and time. She appears well-developed and well-nourished.  HENT:  Head: Normocephalic and atraumatic.  Eyes: Pupils are equal, round, and reactive to light. Conjunctivae and EOM are normal.  Cardiovascular: Normal rate, regular rhythm, normal heart sounds and intact distal pulses.   Pulmonary/Chest: Effort normal and breath sounds normal.  Abdominal: Soft. Bowel sounds are normal.  Neurological: She is alert and oriented to person, place, and time. She has normal reflexes.  Skin: Skin is warm and dry. Lesion and rash noted. Rash is pustular. There is erythema.     Open wound with shallow depth. Surrounding erythema and swelling.  Psychiatric: She has a normal mood and affect. Her behavior is normal. Judgment and thought content normal.      Assessment & Plan:   1. Abscess - clindamycin (CLEOCIN) 300 MG capsule; Take 1 capsule (300 mg total) by mouth 3 (three) times daily.  Dispense: 20 capsule; Refill: 0    Current Outpatient Prescriptions:  .  albuterol (PROVENTIL) (2.5 MG/3ML) 0.083% nebulizer solution, INHALE 1 VIAL VIA NEBULIZER EVERY 6 HOURS AS NEEDED FOR WHEEZING OR SHORTNESS OF  BREATH., Disp: 975 mL, Rfl: 1 .  aspirin 81 MG tablet, Take 81 mg by mouth daily.  , Disp: , Rfl:  .  budesonide-formoterol (SYMBICORT) 160-4.5 MCG/ACT inhaler, USE 2 INHALATIONS TWICE A DAY, Disp: 3 Inhaler, Rfl: 4 .  carvedilol (COREG) 12.5 MG tablet, Take 1 tablet (12.5 mg total) by mouth daily., Disp: 90 tablet, Rfl: 3 .  Cholecalciferol (VITAMIN D) 2000 UNITS tablet, Take 2,000 Units by mouth daily.  , Disp: , Rfl:  .  empagliflozin (JARDIANCE) 10 MG TABS tablet, Take 10 mg by mouth daily., Disp: 30 tablet, Rfl: 3 .  furosemide (LASIX) 20 MG tablet, TAKE 1 TABLET BY MOUTH  DAILY, Disp: 90 tablet, Rfl: 1 .  gabapentin (NEURONTIN) 600 MG tablet, Take 1 tablet (600 mg total) by mouth 3 (three) times daily., Disp: 270 tablet, Rfl: 1 .  glimepiride (AMARYL) 4 MG tablet, TAKE 2 TABLETS BY MOUTH   DAILY WITH BREAKFAST, Disp: 180 tablet, Rfl: 3 .  glucose blood (ONE TOUCH ULTRA TEST) test strip, CHECK FASTING BLOOD SUGAR EVERY MORNING  E11.9, Disp: 300 each, Rfl: 0 .  ipratropium-albuterol (DUONEB) 0.5-2.5 (3) MG/3ML SOLN, Take 3 mLs by nebulization 4 (four) times daily as needed.  , Disp: , Rfl:  .  levothyroxine (SYNTHROID, LEVOTHROID) 50 MCG tablet, TAKE 1 TABLET BY MOUTH  DAILY, Disp: 90 tablet,  Rfl: 2 .  lisinopril (PRINIVIL,ZESTRIL) 10 MG tablet, TAKE 1 TABLET BY MOUTH  DAILY, Disp: 90 tablet, Rfl: 1 .  meloxicam (MOBIC) 15 MG tablet, Take 1 tablet (15 mg total) by mouth daily., Disp: 90 tablet, Rfl: 3 .  ranitidine (ZANTAC) 150 MG tablet, Take 150 mg by mouth daily.  , Disp: , Rfl:  .  simvastatin (ZOCOR) 20 MG tablet, TAKE 1 TABLET BY MOUTH  DAILY AT 6 PM, Disp: 90 tablet, Rfl: 1 .  traMADol (ULTRAM) 50 MG tablet, TAKE 2 TABLETS EVERY 8 HOURS AS NEEDED, Disp: 180 tablet, Rfl: 3 .  triamcinolone cream (KENALOG) 0.1 %, Apply 1 application topically 2 (two) times daily., Disp: 80 g, Rfl: 0 .  clindamycin (CLEOCIN) 300 MG capsule, Take 1 capsule (300 mg total) by mouth 3 (three) times daily., Disp: 20 capsule, Rfl: 0 Continue all other maintenance medications as listed above.  Follow up plan: Return if symptoms worsen or fail to improve, for keep regular follow up.  Educational handout given for survey  Remus Loffler PA-C [facility] [address]  [place], [place] [address] [phone]   04/14/2017, 4:01 PM

## 2017-04-15 ENCOUNTER — Other Ambulatory Visit: Payer: Self-pay | Admitting: Family

## 2017-04-15 DIAGNOSIS — I10 Essential (primary) hypertension: Secondary | ICD-10-CM

## 2017-04-15 DIAGNOSIS — M1990 Unspecified osteoarthritis, unspecified site: Secondary | ICD-10-CM

## 2017-04-19 ENCOUNTER — Telehealth: Payer: Self-pay | Admitting: *Deleted

## 2017-04-19 MED ORDER — CEPHALEXIN 500 MG PO CAPS: 500.0000 mg | ORAL_CAPSULE | Freq: Four times a day (QID) | ORAL | 0 refills | Status: DC

## 2017-04-19 NOTE — Telephone Encounter (Signed)
Patient aware.

## 2017-04-19 NOTE — Addendum Note (Signed)
Addended by: Remus Loffler on: 04/19/2017 02:10 PM   Modules accepted: Orders

## 2017-04-19 NOTE — Telephone Encounter (Signed)
Patient had nausea and vomiting after taking one dose of clindamycin that was prescribed for her .  Please send in another prescription to [facility].

## 2017-05-16 ENCOUNTER — Encounter: Payer: Self-pay | Admitting: *Deleted

## 2017-06-01 ENCOUNTER — Ambulatory Visit: Payer: BLUE CROSS/BLUE SHIELD | Admitting: Family

## 2017-06-02 ENCOUNTER — Ambulatory Visit: Payer: BLUE CROSS/BLUE SHIELD | Admitting: Family

## 2017-06-05 ENCOUNTER — Ambulatory Visit (INDEPENDENT_AMBULATORY_CARE_PROVIDER_SITE_OTHER): Payer: BLUE CROSS/BLUE SHIELD | Admitting: Family

## 2017-06-05 ENCOUNTER — Encounter: Payer: Self-pay | Admitting: Family

## 2017-06-05 VITALS — BP 128/69 | HR 88 | Temp 99.2°F | Ht 63.0 in | Wt 270.2 lb

## 2017-06-05 DIAGNOSIS — Z1211 Encounter for screening for malignant neoplasm of colon: Secondary | ICD-10-CM

## 2017-06-05 DIAGNOSIS — F17200 Nicotine dependence, unspecified, uncomplicated: Secondary | ICD-10-CM

## 2017-06-05 DIAGNOSIS — K219 Gastro-esophageal reflux disease without esophagitis: Secondary | ICD-10-CM | POA: Diagnosis not present

## 2017-06-05 DIAGNOSIS — E1159 Type 2 diabetes mellitus with other circulatory complications: Secondary | ICD-10-CM | POA: Diagnosis not present

## 2017-06-05 DIAGNOSIS — E039 Hypothyroidism, unspecified: Secondary | ICD-10-CM

## 2017-06-05 DIAGNOSIS — E1142 Type 2 diabetes mellitus with diabetic polyneuropathy: Secondary | ICD-10-CM

## 2017-06-05 DIAGNOSIS — J441 Chronic obstructive pulmonary disease with (acute) exacerbation: Secondary | ICD-10-CM | POA: Diagnosis not present

## 2017-06-05 DIAGNOSIS — E1169 Type 2 diabetes mellitus with other specified complication: Secondary | ICD-10-CM | POA: Diagnosis not present

## 2017-06-05 DIAGNOSIS — I152 Hypertension secondary to endocrine disorders: Secondary | ICD-10-CM

## 2017-06-05 DIAGNOSIS — E1165 Type 2 diabetes mellitus with hyperglycemia: Secondary | ICD-10-CM

## 2017-06-05 DIAGNOSIS — R609 Edema, unspecified: Secondary | ICD-10-CM

## 2017-06-05 DIAGNOSIS — M1990 Unspecified osteoarthritis, unspecified site: Secondary | ICD-10-CM | POA: Diagnosis not present

## 2017-06-05 DIAGNOSIS — I10 Essential (primary) hypertension: Secondary | ICD-10-CM

## 2017-06-05 DIAGNOSIS — E785 Hyperlipidemia, unspecified: Secondary | ICD-10-CM

## 2017-06-05 LAB — BAYER DCA HB A1C WAIVED: HB A1C (BAYER DCA - WAIVED): 7.2 % — ABNORMAL HIGH (ref <7.0)

## 2017-06-05 MED ORDER — EMPAGLIFLOZIN 25 MG PO TABS: 25.0000 mg | ORAL_TABLET | Freq: Every day | ORAL | 1 refills | Status: DC

## 2017-06-05 MED ORDER — TRAMADOL HCL 50 MG PO TABS: ORAL_TABLET | ORAL | 3 refills | Status: DC

## 2017-06-05 NOTE — Patient Instructions (Signed)
Diabetes Mellitus and Food It is important for you to manage your blood sugar (glucose) level. Your blood glucose level can be greatly affected by what you eat. Eating healthier foods in the appropriate amounts throughout the day at about the same time each day will help you control your blood glucose level. It can also help slow or prevent worsening of your diabetes mellitus. Healthy eating may even help you improve the level of your blood pressure and reach or maintain a healthy weight. General recommendations for healthful eating and cooking habits include:  Eating meals and snacks regularly. Avoid going long periods of time without eating to lose weight.  Eating a diet that consists mainly of plant-based foods, such as fruits, vegetables, nuts, legumes, and whole grains.  Using low-heat cooking methods, such as baking, instead of high-heat cooking methods, such as deep frying.  Work with your dietitian to make sure you understand how to use the Nutrition Facts information on food labels. How can food affect me? Carbohydrates Carbohydrates affect your blood glucose level more than any other type of food. Your dietitian will help you determine how many carbohydrates to eat at each meal and teach you how to count carbohydrates. Counting carbohydrates is important to keep your blood glucose at a healthy level, especially if you are using insulin or taking certain medicines for diabetes mellitus. Alcohol Alcohol can cause sudden decreases in blood glucose (hypoglycemia), especially if you use insulin or take certain medicines for diabetes mellitus. Hypoglycemia can be a life-threatening condition. Symptoms of hypoglycemia (sleepiness, dizziness, and disorientation) are similar to symptoms of having too much alcohol. If your health care provider has given you approval to drink alcohol, do so in moderation and use the following guidelines:  Women should not have more than one drink per day, and men  should not have more than two drinks per day. One drink is equal to: ? 12 oz of beer. ? 5 oz of wine. ? 1 oz of hard liquor.  Do not drink on an empty stomach.  Keep yourself hydrated. Have water, diet soda, or unsweetened iced tea.  Regular soda, juice, and other mixers might contain a lot of carbohydrates and should be counted.  What foods are not recommended? As you make food choices, it is important to remember that all foods are not the same. Some foods have fewer nutrients per serving than other foods, even though they might have the same number of calories or carbohydrates. It is difficult to get your body what it needs when you eat foods with fewer nutrients. Examples of foods that you should avoid that are high in calories and carbohydrates but low in nutrients include:  Trans fats (most processed foods list trans fats on the Nutrition Facts label).  Regular soda.  Juice.  Candy.  Sweets, such as cake, pie, doughnuts, and cookies.  Fried foods.  What foods can I eat? Eat nutrient-rich foods, which will nourish your body and keep you healthy. The food you should eat also will depend on several factors, including:  The calories you need.  The medicines you take.  Your weight.  Your blood glucose level.  Your blood pressure level.  Your cholesterol level.  You should eat a variety of foods, including:  Protein. ? Lean cuts of meat. ? Proteins low in saturated fats, such as fish, egg whites, and beans. Avoid processed meats.  Fruits and vegetables. ? Fruits and vegetables that may help control blood glucose levels, such as apples,   mangoes, and yams.  Dairy products. ? Choose fat-free or low-fat dairy products, such as milk, yogurt, and cheese.  Grains, bread, pasta, and rice. ? Choose whole grain products, such as multigrain bread, whole oats, and brown rice. These foods may help control blood pressure.  Fats. ? Foods containing healthful fats, such as  nuts, avocado, olive oil, canola oil, and fish.  Does everyone with diabetes mellitus have the same meal plan? Because every person with diabetes mellitus is different, there is not one meal plan that works for everyone. It is very important that you meet with a dietitian who will help you create a meal plan that is just right for you. This information is not intended to replace advice given to you by your health care provider. Make sure you discuss any questions you have with your health care provider. Document Released: 03/31/2005 Document Revised: 12/10/2015 Document Reviewed: 05/31/2013 Elsevier Interactive Patient Education  2017 [employer]  

## 2017-06-05 NOTE — Progress Notes (Signed)
Subjective:    Patient ID: Jennifer Rasmussen, female    DOB: Feb 12, 1951, 66 y.o.   MRN: [id number]  PT presents to the office today for chronic follow up.  Diabetes  She presents for her follow-up diabetic visit. She has type 2 diabetes mellitus. Her disease course has been worsening. There are no hypoglycemic associated symptoms. Associated symptoms include foot paresthesias. Pertinent negatives for diabetes include no blurred vision and no visual change. There are no hypoglycemic complications. Symptoms are stable. Diabetic complications include heart disease and peripheral neuropathy. Pertinent negatives for diabetic complications include no CVA. Risk factors for coronary artery disease include diabetes mellitus, family history, dyslipidemia, obesity, hypertension, sedentary lifestyle and post-menopausal. She is following a generally unhealthy diet. Her breakfast blood glucose range is generally 140-180 mg/dl. Eye exam is current.  Hypertension  This is a chronic problem. The current episode started more than 1 year ago. The problem has been resolved since onset. The problem is controlled. Associated symptoms include malaise/fatigue and peripheral edema. Pertinent negatives include no blurred vision or shortness of breath. Risk factors for coronary artery disease include diabetes mellitus, dyslipidemia, family history, post-menopausal state, obesity and smoking/tobacco exposure. The current treatment provides moderate improvement. There is no history of CVA. Identifiable causes of hypertension include a thyroid problem.  Hyperlipidemia  This is a chronic problem. The current episode started more than 1 year ago. The problem is uncontrolled. Recent lipid tests were reviewed and are high. Exacerbating diseases include obesity. Pertinent negatives include no shortness of breath. Current antihyperlipidemic treatment includes statins. The current treatment provides mild improvement of lipids.  Arthritis    Presents for follow-up visit. She complains of pain and stiffness. The symptoms have been worsening. Affected locations include the right knee, left knee, left hip and right hip. Her pain is at a severity of 10/10.  Thyroid Problem  Presents for follow-up visit. Symptoms include constipation. Patient reports no depressed mood, dry skin or visual change. The symptoms have been stable. Her past medical history is significant for hyperlipidemia.  Gastroesophageal Reflux  She complains of coughing and heartburn. This is a chronic problem. Risk factors include obesity. The treatment provided moderate relief.  COPD PT using Symbicort BID. PT smoking a 3/4 a pack a day.  Peripheral Edema Pt using SCD's every other day and wears compression hose daily. Stable Diabetic Neuropathy  Complaining of intermittent burning and tingling in hands of 10 out 10.   Review of Systems  Constitutional: Positive for malaise/fatigue.  Eyes: Negative for blurred vision.  Respiratory: Positive for cough. Negative for shortness of breath.   Gastrointestinal: Positive for constipation and heartburn.  Musculoskeletal: Positive for arthritis and stiffness.  All other systems reviewed and are negative.      Objective:   Physical Exam  Constitutional: She is oriented to person, place, and time. She appears well-developed and well-nourished. No distress.  Morbid obese  HENT:  Head: Normocephalic and atraumatic.  Right Ear: External ear normal.  Left Ear: External ear normal.  Nose: Mucosal edema and rhinorrhea present.  Mouth/Throat: Posterior oropharyngeal erythema present.  Eyes: Pupils are equal, round, and reactive to light.  Neck: Normal range of motion. Neck supple. No thyromegaly present.  Cardiovascular: Normal rate, regular rhythm, normal heart sounds and intact distal pulses.  No murmur heard. Pulmonary/Chest: Effort normal. No respiratory distress. She has wheezes.  Abdominal: Soft. Bowel sounds are  normal. She exhibits no distension. There is no tenderness.  Musculoskeletal: Normal range of motion. She exhibits  no edema or tenderness.  Pt in wheelchair, generalized deconditioning and weakness  Neurological: She is alert and oriented to person, place, and time. She has normal reflexes. No cranial nerve deficit.  Skin: Skin is warm and dry.  Psychiatric: She has a normal mood and affect. Her behavior is normal. Judgment and thought content normal.  Vitals reviewed.    BP 128/69   Pulse 88   Temp 99.2 F (37.3 C) (Oral)   Ht _0  (1.6 m)   Wt 270 lb 3.2 oz (122.6 kg)   LMP 10/24/2001   BMI 47.86 kg/m      Assessment & Plan:  1. Type 2 diabetes mellitus with hyperglycemia, without long-term current use of insulin (HCC) -Will increase Jardiance to 25 mg from 10 mg  Low carb diet - Bayer DCA Hb A1c Waived - CMP14+EGFR - empagliflozin (JARDIANCE) 25 MG TABS tablet; Take 25 mg daily by mouth.  Dispense: 90 tablet; Refill: 1  2. Hypertension associated with diabetes ([place]) - CMP14+EGFR  3. Gastroesophageal reflux disease, esophagitis presence not specified - CMP14+EGFR  4. Hypothyroidism, unspecified type - CMP14+EGFR - TSH  5. Diabetic peripheral neuropathy (HCC) - CMP14+EGFR - traMADol (ULTRAM) 50 MG tablet; TAKE 2 TABLETS EVERY 8 HOURS AS NEEDED  Dispense: 180 tablet; Refill: 3  6. Morbid obesity ([place]) - CMP14+EGFR  7. Current smoker - CMP14+EGFR  8. Hyperlipidemia associated with type 2 diabetes mellitus (HCC) - CMP14+EGFR - Lipid panel  9. Peripheral edema - CMP14+EGFR  10. Arthritis - CMP14+EGFR - traMADol (ULTRAM) 50 MG tablet; TAKE 2 TABLETS EVERY 8 HOURS AS NEEDED  Dispense: 180 tablet; Refill: 3  11. COPD exacerbation ([place]) - CMP14+EGFR  12. Colon cancer screening - CMP14+EGFR - Fecal occult blood, imunochemical; Future   Continue all meds Labs pending Health Maintenance reviewed Diet and exercise encouraged RTO 3 months   Evelina Dun, FNP

## 2017-06-06 ENCOUNTER — Encounter: Payer: Self-pay | Admitting: *Deleted

## 2017-06-06 LAB — TSH: TSH: 3.06 u[IU]/mL (ref 0.450–4.500)

## 2017-06-06 LAB — CMP14+EGFR
A/G RATIO: 0.8 — AB (ref 1.2–2.2)
ALK PHOS: 142 IU/L — AB (ref 39–117)
ALT: 5 IU/L (ref 0–32)
AST: 9 IU/L (ref 0–40)
Albumin: 3.2 g/dL — ABNORMAL LOW (ref 3.6–4.8)
BILIRUBIN TOTAL: 0.3 mg/dL (ref 0.0–1.2)
BUN / CREAT RATIO: 11 — AB (ref 12–28)
BUN: 10 mg/dL (ref 8–27)
CO2: 27 mmol/L (ref 20–29)
Calcium: 9 mg/dL (ref 8.7–10.3)
Chloride: 100 mmol/L (ref 96–106)
Creatinine, Ser: 0.93 mg/dL (ref 0.57–1.00)
GFR calc Af Amer: 74 mL/min/{1.73_m2} (ref >59)
GFR calc non Af Amer: 64 mL/min/{1.73_m2} (ref >59)
GLOBULIN, TOTAL: 3.8 g/dL (ref 1.5–4.5)
Glucose: 114 mg/dL — ABNORMAL HIGH (ref 65–99)
Potassium: 4.5 mmol/L (ref 3.5–5.2)
SODIUM: 142 mmol/L (ref 134–144)
Total Protein: 7 g/dL (ref 6.0–8.5)

## 2017-06-06 LAB — LIPID PANEL
CHOLESTEROL TOTAL: 139 mg/dL (ref 100–199)
Chol/HDL Ratio: 3.8 ratio (ref 0.0–4.4)
HDL: 37 mg/dL — ABNORMAL LOW (ref >39)
LDL CALC: 67 mg/dL (ref 0–99)
Triglycerides: 174 mg/dL — ABNORMAL HIGH (ref 0–149)
VLDL CHOLESTEROL CAL: 35 mg/dL (ref 5–40)

## 2017-06-14 ENCOUNTER — Telehealth: Payer: Self-pay | Admitting: Family

## 2017-06-14 DIAGNOSIS — I10 Essential (primary) hypertension: Secondary | ICD-10-CM

## 2017-06-15 MED ORDER — CARVEDILOL 12.5 MG PO TABS: 12.5000 mg | ORAL_TABLET | Freq: Two times a day (BID) | ORAL | 1 refills | Status: DC

## 2017-06-15 NOTE — Telephone Encounter (Signed)
Coreg tab 12.5 mg typically dosed BID Current Rx dosed qd  Please advise & return call to [facility] You are aware of requirements & warnings & wish to continue patients current therapy Or You want to change patients therapy to:

## 2017-06-20 ENCOUNTER — Telehealth: Payer: Self-pay | Admitting: Family

## 2017-06-20 DIAGNOSIS — J441 Chronic obstructive pulmonary disease with (acute) exacerbation: Secondary | ICD-10-CM

## 2017-06-20 MED ORDER — ALBUTEROL SULFATE (2.5 MG/3ML) 0.083% IN NEBU
INHALATION_SOLUTION | RESPIRATORY_TRACT | 0 refills | Status: AC
Start: 2017-06-20 — End: ?

## 2017-06-20 NOTE — Telephone Encounter (Signed)
Call back Coreg already corrected Albuterol neb solution needed 90d supply refill sent

## 2017-06-20 NOTE — Patient Instructions (Signed)
Your procedure is scheduled on: 06/26/2017  Report to [facility] at  0700  AM.  Call this number if you have problems the morning of surgery: [phone]   Do not eat food or drink liquids :After Midnight.      Take these medicines the morning of surgery with A SIP OF WATER: neurontin, coreg, levothyroxine, lisinopril, mobic, zantac.Marland Kitchen. Use your nebulizer and your inhaler before you come.   Do not wear jewelry, make-up or nail polish.  Do not wear lotions, powders, or perfumes. You may wear deodorant.  Do not shave 48 hours prior to surgery.  Do not bring valuables to the hospital.  Contacts, dentures or bridgework may not be worn into surgery.  Leave suitcase in the car. After surgery it may be brought to your room.  For patients admitted to the hospital, checkout time is 11:00 AM the day of discharge.   Patients discharged the day of surgery will not be allowed to drive home.  :     Please read over the following fact sheets that you were given: Coughing and Deep Breathing, Surgical Site Infection Prevention, Anesthesia Post-op Instructions and Care and Recovery After Surgery    Cataract A cataract is a clouding of the lens of the eye. When a lens becomes cloudy, vision is reduced based on the degree and nature of the clouding. Many cataracts reduce vision to some degree. Some cataracts make people more near-sighted as they develop. Other cataracts increase glare. Cataracts that are ignored and become worse can sometimes look white. The white color can be seen through the pupil. CAUSES   Aging. However, cataracts may occur at any age, even in newborns.   Certain drugs.   Trauma to the eye.   Certain diseases such as diabetes.   Specific eye diseases such as chronic inflammation inside the eye or a sudden attack of a rare form of glaucoma.   Inherited or acquired medical problems.  SYMPTOMS   Gradual, progressive drop in vision in the affected eye.   Severe, rapid visual loss.  This most often happens when trauma is the cause.  DIAGNOSIS  To detect a cataract, an eye doctor examines the lens. Cataracts are best diagnosed with an exam of the eyes with the pupils enlarged (dilated) by drops.  TREATMENT  For an early cataract, vision may improve by using different eyeglasses or stronger lighting. If that does not help your vision, surgery is the only effective treatment. A cataract needs to be surgically removed when vision loss interferes with your everyday activities, such as driving, reading, or watching TV. A cataract may also have to be removed if it prevents examination or treatment of another eye problem. Surgery removes the cloudy lens and usually replaces it with a substitute lens (intraocular lens, IOL).  At a time when both you and your doctor agree, the cataract will be surgically removed. If you have cataracts in both eyes, only one is usually removed at a time. This allows the operated eye to heal and be out of danger from any possible problems after surgery (such as infection or poor wound healing). In rare cases, a cataract may be doing damage to your eye. In these cases, your caregiver may advise surgical removal right away. The vast majority of people who have cataract surgery have better vision afterward. HOME CARE INSTRUCTIONS  If you are not planning surgery, you may be asked to do the following:  Use different eyeglasses.   Use stronger or  brighter lighting.   Ask your eye doctor about reducing your medicine dose or changing medicines if it is thought that a medicine caused your cataract. Changing medicines does not make the cataract go away on its own.   Become familiar with your surroundings. Poor vision can lead to injury. Avoid bumping into things on the affected side. You are at a higher risk for tripping or falling.   Exercise extreme care when driving or operating machinery.   Wear sunglasses if you are sensitive to bright light or experiencing  problems with glare.  SEEK IMMEDIATE MEDICAL CARE IF:   You have a worsening or sudden vision loss.   You notice redness, swelling, or increasing pain in the eye.   You have a fever.  Document Released: 07/04/2005 Document Revised: 06/23/2011 Document Reviewed: 02/25/2011 [facility] Patient Information 2012 [place].PATIENT INSTRUCTIONS POST-ANESTHESIA  IMMEDIATELY FOLLOWING SURGERY:  Do not drive or operate machinery for the first twenty four hours after surgery.  Do not make any important decisions for twenty four hours after surgery or while taking narcotic pain medications or sedatives.  If you develop intractable nausea and vomiting or a severe headache please notify your doctor immediately.  FOLLOW-UP:  Please make an appointment with your surgeon as instructed. You do not need to follow up with anesthesia unless specifically instructed to do so.  WOUND CARE INSTRUCTIONS (if applicable):  Keep a dry clean dressing on the anesthesia/puncture wound site if there is drainage.  Once the wound has quit draining you may leave it open to air.  Generally you should leave the bandage intact for twenty four hours unless there is drainage.  If the epidural site drains for more than 36-48 hours please call the anesthesia department.  QUESTIONS?:  Please feel free to call your physician or the hospital operator if you have any questions, and they will be happy to assist you.

## 2017-06-22 ENCOUNTER — Other Ambulatory Visit: Payer: Self-pay

## 2017-06-22 ENCOUNTER — Encounter (HOSPITAL_COMMUNITY): Payer: Self-pay

## 2017-06-22 ENCOUNTER — Encounter (HOSPITAL_COMMUNITY)
Admission: RE | Admit: 2017-06-22 | Discharge: 2017-06-22 | Disposition: A | Discharge status: Home / Self Care | Payer: BLUE CROSS/BLUE SHIELD | Source: Ambulatory Visit | Attending: Ophthalmology | Admitting: Ophthalmology

## 2017-06-22 DIAGNOSIS — R9431 Abnormal electrocardiogram [ECG] [EKG]: Secondary | ICD-10-CM | POA: Insufficient documentation

## 2017-06-22 DIAGNOSIS — Z01812 Encounter for preprocedural laboratory examination: Secondary | ICD-10-CM | POA: Insufficient documentation

## 2017-06-22 DIAGNOSIS — Z01810 Encounter for preprocedural cardiovascular examination: Secondary | ICD-10-CM | POA: Diagnosis not present

## 2017-06-22 HISTORY — DX: Sleep apnea, unspecified: G47.30

## 2017-06-22 HISTORY — DX: Hypothyroidism, unspecified: E03.9

## 2017-06-22 HISTORY — DX: Gastro-esophageal reflux disease without esophagitis: K21.9

## 2017-06-22 LAB — CBC WITH DIFFERENTIAL/PLATELET
BASOS ABS: 0 10*3/uL (ref 0.0–0.1)
BASOS PCT: 0 %
EOS ABS: 0.2 10*3/uL (ref 0.0–0.7)
Eosinophils Relative: 2 %
HCT: 41.5 % (ref 36.0–46.0)
Hemoglobin: 12.1 g/dL (ref 12.0–15.0)
Lymphocytes Relative: 17 %
Lymphs Abs: 2 10*3/uL (ref 0.7–4.0)
MCH: 23 pg — ABNORMAL LOW (ref 26.0–34.0)
MCHC: 29.2 g/dL — AB (ref 30.0–36.0)
MCV: 78.9 fL (ref 78.0–100.0)
MONO ABS: 0.6 10*3/uL (ref 0.1–1.0)
MONOS PCT: 5 %
NEUTROS PCT: 76 %
Neutro Abs: 8.5 10*3/uL — ABNORMAL HIGH (ref 1.7–7.7)
Platelets: 343 10*3/uL (ref 150–400)
RBC: 5.26 MIL/uL — ABNORMAL HIGH (ref 3.87–5.11)
RDW: 18.5 % — AB (ref 11.5–15.5)
WBC: 11.3 10*3/uL — ABNORMAL HIGH (ref 4.0–10.5)

## 2017-06-22 LAB — BASIC METABOLIC PANEL
ANION GAP: 11 (ref 5–15)
BUN: 13 mg/dL (ref 6–20)
CALCIUM: 9.1 mg/dL (ref 8.9–10.3)
CO2: 27 mmol/L (ref 22–32)
CREATININE: 1.02 mg/dL — AB (ref 0.44–1.00)
Chloride: 99 mmol/L — ABNORMAL LOW (ref 101–111)
GFR, EST NON AFRICAN AMERICAN: 56 mL/min — AB (ref >60)
Glucose, Bld: 221 mg/dL — ABNORMAL HIGH (ref 65–99)
Potassium: 4.4 mmol/L (ref 3.5–5.1)
SODIUM: 137 mmol/L (ref 135–145)

## 2017-06-22 LAB — HEMOGLOBIN A1C
HEMOGLOBIN A1C: 6.8 % — AB (ref 4.8–5.6)
MEAN PLASMA GLUCOSE: 148.46 mg/dL

## 2017-06-22 LAB — GLUCOSE, CAPILLARY: GLUCOSE-CAPILLARY: 211 mg/dL — AB (ref 65–99)

## 2017-06-23 NOTE — Pre-Procedure Instructions (Signed)
HgbA1C routed to PCP. 

## 2017-06-26 ENCOUNTER — Ambulatory Visit (HOSPITAL_COMMUNITY): Admission: RE | Admit: 2017-06-26 | Payer: BLUE CROSS/BLUE SHIELD | Source: Ambulatory Visit | Admitting: Ophthalmology

## 2017-06-26 ENCOUNTER — Encounter (HOSPITAL_COMMUNITY): Admission: RE | Payer: Self-pay | Source: Ambulatory Visit

## 2017-06-26 SURGERY — PHACOEMULSIFICATION, CATARACT, WITH IOL INSERTION
Anesthesia: Monitor Anesthesia Care | Site: Eye | Laterality: Right

## 2017-07-28 ENCOUNTER — Other Ambulatory Visit: Payer: Self-pay | Admitting: Family

## 2017-07-28 DIAGNOSIS — I10 Essential (primary) hypertension: Secondary | ICD-10-CM

## 2017-07-28 DIAGNOSIS — M1990 Unspecified osteoarthritis, unspecified site: Secondary | ICD-10-CM

## 2017-07-28 DIAGNOSIS — E1142 Type 2 diabetes mellitus with diabetic polyneuropathy: Secondary | ICD-10-CM

## 2017-08-12 DIAGNOSIS — J441 Chronic obstructive pulmonary disease with (acute) exacerbation: Secondary | ICD-10-CM | POA: Diagnosis not present

## 2017-08-13 ENCOUNTER — Other Ambulatory Visit: Payer: Self-pay | Admitting: Family

## 2017-08-13 DIAGNOSIS — J441 Chronic obstructive pulmonary disease with (acute) exacerbation: Secondary | ICD-10-CM

## 2017-08-13 DIAGNOSIS — E785 Hyperlipidemia, unspecified: Secondary | ICD-10-CM

## 2017-08-17 ENCOUNTER — Ambulatory Visit: Payer: BLUE CROSS/BLUE SHIELD | Admitting: *Deleted

## 2017-08-23 ENCOUNTER — Ambulatory Visit: Payer: Medicare Other | Admitting: *Deleted

## 2017-08-30 ENCOUNTER — Ambulatory Visit (INDEPENDENT_AMBULATORY_CARE_PROVIDER_SITE_OTHER): Payer: Medicare Other | Admitting: Family

## 2017-08-30 ENCOUNTER — Encounter: Payer: Self-pay | Admitting: *Deleted

## 2017-08-30 ENCOUNTER — Encounter: Payer: Self-pay | Admitting: Family

## 2017-08-30 ENCOUNTER — Ambulatory Visit (INDEPENDENT_AMBULATORY_CARE_PROVIDER_SITE_OTHER): Payer: Medicare Other | Admitting: *Deleted

## 2017-08-30 VITALS — BP 131/72 | HR 72 | Ht 63.0 in | Wt 263.0 lb

## 2017-08-30 DIAGNOSIS — M1990 Unspecified osteoarthritis, unspecified site: Secondary | ICD-10-CM

## 2017-08-30 DIAGNOSIS — E039 Hypothyroidism, unspecified: Secondary | ICD-10-CM

## 2017-08-30 DIAGNOSIS — I152 Hypertension secondary to endocrine disorders: Secondary | ICD-10-CM

## 2017-08-30 DIAGNOSIS — F17200 Nicotine dependence, unspecified, uncomplicated: Secondary | ICD-10-CM | POA: Diagnosis not present

## 2017-08-30 DIAGNOSIS — J441 Chronic obstructive pulmonary disease with (acute) exacerbation: Secondary | ICD-10-CM | POA: Diagnosis not present

## 2017-08-30 DIAGNOSIS — R609 Edema, unspecified: Secondary | ICD-10-CM | POA: Diagnosis not present

## 2017-08-30 DIAGNOSIS — E1142 Type 2 diabetes mellitus with diabetic polyneuropathy: Secondary | ICD-10-CM | POA: Diagnosis not present

## 2017-08-30 DIAGNOSIS — K219 Gastro-esophageal reflux disease without esophagitis: Secondary | ICD-10-CM | POA: Diagnosis not present

## 2017-08-30 DIAGNOSIS — E1165 Type 2 diabetes mellitus with hyperglycemia: Secondary | ICD-10-CM | POA: Diagnosis not present

## 2017-08-30 DIAGNOSIS — E785 Hyperlipidemia, unspecified: Secondary | ICD-10-CM

## 2017-08-30 DIAGNOSIS — Z0000 Encounter for general adult medical examination without abnormal findings: Secondary | ICD-10-CM | POA: Diagnosis not present

## 2017-08-30 DIAGNOSIS — E1159 Type 2 diabetes mellitus with other circulatory complications: Secondary | ICD-10-CM | POA: Diagnosis not present

## 2017-08-30 DIAGNOSIS — I10 Essential (primary) hypertension: Secondary | ICD-10-CM | POA: Diagnosis not present

## 2017-08-30 DIAGNOSIS — E1169 Type 2 diabetes mellitus with other specified complication: Secondary | ICD-10-CM

## 2017-08-30 LAB — BAYER DCA HB A1C WAIVED: HB A1C: 5.9 % (ref <7.0)

## 2017-08-30 MED ORDER — LISINOPRIL 10 MG PO TABS: 10.0000 mg | ORAL_TABLET | Freq: Every day | ORAL | 0 refills | Status: DC

## 2017-08-30 MED ORDER — EMPAGLIFLOZIN 25 MG PO TABS: 25.0000 mg | ORAL_TABLET | Freq: Every day | ORAL | 1 refills | Status: DC

## 2017-08-30 MED ORDER — SIMVASTATIN 20 MG PO TABS: ORAL_TABLET | ORAL | 1 refills | Status: AC

## 2017-08-30 MED ORDER — FUROSEMIDE 20 MG PO TABS: 20.0000 mg | ORAL_TABLET | Freq: Every day | ORAL | 1 refills | Status: DC

## 2017-08-30 MED ORDER — TRAMADOL HCL 50 MG PO TABS: ORAL_TABLET | ORAL | 3 refills | Status: DC

## 2017-08-30 MED ORDER — CARVEDILOL 12.5 MG PO TABS: 12.5000 mg | ORAL_TABLET | Freq: Two times a day (BID) | ORAL | 1 refills | Status: AC

## 2017-08-30 MED ORDER — BUDESONIDE-FORMOTEROL FUMARATE 160-4.5 MCG/ACT IN AERO: INHALATION_SPRAY | RESPIRATORY_TRACT | 3 refills | Status: AC

## 2017-08-30 MED ORDER — MELOXICAM 15 MG PO TABS: 15.0000 mg | ORAL_TABLET | Freq: Every day | ORAL | 1 refills | Status: DC

## 2017-08-30 MED ORDER — GLIMEPIRIDE 4 MG PO TABS: ORAL_TABLET | ORAL | 3 refills | Status: DC

## 2017-08-30 MED ORDER — LEVOTHYROXINE SODIUM 50 MCG PO TABS: 50.0000 ug | ORAL_TABLET | Freq: Every day | ORAL | 2 refills | Status: AC

## 2017-08-30 NOTE — Progress Notes (Signed)
Subjective:   Jennifer FranklinJanet Rasmussen is a 67 y.o. female who presents for an Initial Medicare Annual Wellness Visit. Married and lives at home with husband in one story home with basement. She does not have to go into the basement. Her husband works full time but helps her out at home. He does all of the washing/drying and cooking. She has a housekeeper that comes every 2 weeks. 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   Review of Systems    Health is about the same as last year.   Musculoskeletal: Needs bilateral hip replacements but ortho advised that she would have to lose weight before surgery could be done safely.   Cardiac Risk Factors include: obesity (BMI >30kg/m2);sedentary lifestyle;smoking/ tobacco exposure;hypertension;diabetes mellitus;dyslipidemia;advanced age (>7755men, 60>65 women)  Other systems negative today    Objective:    Today's Vitals   08/30/17 0840 08/30/17 0930  BP: 131/72   Pulse: 72   Weight:  263 lb (119.3 kg)  Height:  5\' 3"  (1.6 m)  PainSc: 8     Body mass index is 46.59 kg/m.  Advanced Directives 08/30/2017 06/22/2017 12/16/2016  Does Patient Have a Medical Advance Directive? No No No  Would patient like information on creating a medical advance directive? No - Patient declined No - Patient declined -    Current Medications (verified) Outpatient Encounter Medications as of 08/30/2017  Medication Sig  . albuterol (PROVENTIL) (2.5 MG/3ML) 0.083% nebulizer solution INHALE 1 VIAL VIA NEBULIZER EVERY 6 HOURS AS NEEDED FOR WHEEZING OR SHORTNESS OF  BREATH.  Marland Kitchen. aspirin 81 MG tablet Take 81 mg by mouth daily.    . Cholecalciferol (VITAMIN D) 2000 UNITS tablet Take 2,000 Units by mouth daily.    Marland Kitchen. glucose blood (ONE TOUCH ULTRA TEST) test strip CHECK FASTING BLOOD SUGAR EVERY MORNING  E11.9  . ipratropium-albuterol (DUONEB) 0.5-2.5 (3) MG/3ML SOLN Take 3 mLs by nebulization 4 (four) times daily as needed.    . ranitidine (ZANTAC) 150 MG  tablet Take 150 mg by mouth daily.    Marland Kitchen. triamcinolone cream (KENALOG) 0.1 % Apply 1 application topically 2 (two) times daily. (Patient taking differently: Apply 1 application topically every other day. )  . [DISCONTINUED] budesonide-formoterol (SYMBICORT) 160-4.5 MCG/ACT inhaler USE 2 INHALATIONS TWICE A  DAY  . [DISCONTINUED] carvedilol (COREG) 12.5 MG tablet Take 1 tablet (12.5 mg total) by mouth 2 (two) times daily with a meal.  . [DISCONTINUED] empagliflozin (JARDIANCE) 25 MG TABS tablet Take 25 mg daily by mouth.  . [DISCONTINUED] furosemide (LASIX) 20 MG tablet TAKE 1 TABLET BY MOUTH  DAILY  . [DISCONTINUED] gabapentin (NEURONTIN) 600 MG tablet TAKE 1 TABLET BY MOUTH 3  TIMES DAILY  . [DISCONTINUED] glimepiride (AMARYL) 4 MG tablet TAKE 2 TABLETS BY MOUTH  DAILY WITH BREAKFAST  . [DISCONTINUED] levothyroxine (SYNTHROID, LEVOTHROID) 50 MCG tablet TAKE 1 TABLET BY MOUTH  DAILY  . [DISCONTINUED] lisinopril (PRINIVIL,ZESTRIL) 10 MG tablet TAKE 1 TABLET BY MOUTH  DAILY  . [DISCONTINUED] meloxicam (MOBIC) 15 MG tablet TAKE 1 TABLET BY MOUTH  DAILY  . [DISCONTINUED] simvastatin (ZOCOR) 20 MG tablet TAKE 1 TABLET BY MOUTH  DAILY AT 6PM  . [DISCONTINUED] traMADol (ULTRAM) 50 MG tablet TAKE 2 TABLETS EVERY 8 HOURS AS NEEDED (Patient taking differently: Take 100 mg by mouth every 8 (eight) hours as needed for moderate pain or severe pain. TAKE 2 TABLETS EVERY 8 HOURS AS NEEDED)   No facility-administered encounter medications on  file as of 08/30/2017.     Allergies (verified) Patient has no known allergies.   History: Past Medical History:  Diagnosis Date  . COPD (chronic obstructive pulmonary disease) with chronic bronchitis (HCC)   . Diabetes mellitus, type 2 (HCC)   . DJD (degenerative joint disease)   . GERD (gastroesophageal reflux disease)   . H pylori ulcer   . Hyperlipidemia   . Hypertension   . Hypothyroidism   . Sleep apnea    Uses O2 at 3.5 liters at night  . Thyroid disease      Past Surgical History:  Procedure Laterality Date  . LAPAROSCOPIC GASTRIC BANDING    . REPLACEMENT TOTAL KNEE BILATERAL Bilateral   . TUBAL LIGATION     Family History  Problem Relation Age of Onset  . Multiple sclerosis Mother   . Heart disease Father   . Stroke Father   . Arthritis Father   . Diabetes Sister   . Arthritis Sister   . Diabetes Brother   . Arthritis Brother   . Diabetes Sister   . Arthritis Sister    Social History   Socioeconomic History  . Marital status: Married    Spouse name: Chrissie Noa  . Number of children: 0  . Years of education: 35  . Highest education level: High school graduate  Social Needs  . Financial resource strain: Not hard at all  . Food insecurity - worry: Never true  . Food insecurity - inability: Never true  . Transportation needs - medical: No  . Transportation needs - non-medical: No  Occupational History  . Occupation: Retired    Comment: Curator at [employer]  . Smoking status: Current Every Day Smoker    Packs/day: 0.50    Years: 30.00    Pack years: 15.00    Types: Cigarettes  . Smokeless tobacco: Never Used  Substance and Sexual Activity  . Alcohol use: No  . Drug use: No  . Sexual activity: Yes    Birth control/protection: Surgical  Other Topics Concern  . Not on file  Social History Narrative   Married and lives at home with husband in one story home with basement. She does not have to go into the basement. Her husband works full time but helps her out at home. He does all of the washing/drying and cooking. She has a housekeeper that comes every 2 weeks. They do not have children and she is retired from [employer] as a Curator.     Tobacco Counseling Ready to quit: No Counseling given: No   Clinical Intake:     Pain : 0-10 Pain Score: 8  Pain Type: Chronic pain Pain Location: Hip Pain Orientation: Right, Left Pain Descriptors / Indicators: Aching Pain Onset: More than a month ago Pain Frequency:  Constant Pain Relieving Factors: Takes tramadol and mobic Effect of Pain on Daily Activities: significant  Pain Relieving Factors: Takes tramadol and mobic  Nutritional Status: BMI > 30  Obese Diabetes: Yes CBG done?: No Did pt. bring in CBG monitor from home?: No  How often do you need to have someone help you when you read instructions, pamphlets, or other written materials from your doctor or pharmacy?: 1 - Never What is the last grade level you completed in school?: 12th     Information entered by :: Demetrios Loll, RN   Activities of Daily Living In your present state of health, do you have any difficulty performing the following activities: 08/30/2017 06/22/2017  Hearing? N N  Vision? N Y  Comment Dr Laveda Norman. Has been seen this year. Needs cataract surgery -  Difficulty concentrating or making decisions? Y N  Comment sometimes she will forget to do things that her husband asked her to do  -  Walking or climbing stairs? Y Y  Comment due to arthritis in both hips and morbid obesity -  Dressing or bathing? Y Y  Comment Is not able to get into the bath. Does a pan bath -  Doing errands, shopping? Y N  Comment Is not able to drive. Husband and sisters help -  Quarry manager and eating ? Y -  Comment no trouble eating. Has trouble with preparation -  Using the Toilet? Y -  Comment Has a seat -  In the past six months, have you accidently leaked urine? Y -  Comment wears a pad at all times -  Do you have problems with loss of bowel control? N -  Managing your Medications? N -  Managing your Finances? N -  Housekeeping or managing your Housekeeping? Y -  Comment Has a housekeeper every two weeks and her husband helps -  Some recent data might be hidden     Immunizations and Health Maintenance Immunization History  Administered Date(s) Administered  . Pneumococcal Conjugate-13 01/07/2015  . Pneumococcal Polysaccharide-23 01/15/2016  . Tdap 10/10/2010   Health Maintenance  Due  Topic Date Due  . MAMMOGRAM  05/16/2011  . DEXA SCAN  08/28/2014  . COLON CANCER SCREENING ANNUAL FOBT  04/28/2017    Patient Care Team: Junie Spencer, FNP as PCP - General (Nurse Practitioner)  No hospitalizations, ER visits, or surgeries this past year.       Assessment:   This is a routine wellness examination for Jennifer Rasmussen.  Hearing/Vision screen No deficits noted during visit. Eye exam is up to date. Was scheduled for cataract surgery but that was cancelled due to inclement weather and has not bee rescheduled yet.   Dietary issues and exercise activities discussed: Current Exercise Habits: The patient does not participate in regular exercise at present, Exercise limited by: Other - see comments;orthopedic condition(s)   Diet: Husband prepares most meals. Drinks bout 1 bottle of water a day.   Goals    . Exercise 150 min/wk Moderate Activity     Chair exercises daily        Depression Screen PHQ 2/9 Scores 08/30/2017 06/05/2017 04/14/2017 03/02/2017 11/10/2016 08/09/2016 04/19/2016  PHQ - 2 Score 0 0 0 0 1 0 0  PHQ- 9 Score - - - - - - -    Fall Risk Fall Risk  08/30/2017 06/05/2017 04/14/2017 03/02/2017 11/10/2016  Falls in the past year? Yes No No No No  Comment Slid off of kitchen seat - - - -  Number falls in past yr: 1 - - - -  Injury with Fall? No - - - -  Follow up Falls prevention discussed - - - -    Is the patient's home free of loose throw rugs in walkways, pet beds, electrical cords, etc?   yes      Grab bars in the bathroom? no      Handrails on the stairs?   yes      Adequate lighting?   yes  Cognitive Function: MMSE - Mini Mental State Exam 08/30/2017  Orientation to time 5  Orientation to Place 5  Registration 3  Attention/ Calculation 5  Recall 2  Language- name  2 objects 2  Language- repeat 1  Language- follow 3 step command 3  Language- read & follow direction 1  Write a sentence 1  Copy design 1  Total score 29  normal exam       Screening Tests Health Maintenance  Topic Date Due  . MAMMOGRAM  05/16/2011  . DEXA SCAN  08/28/2014  . COLON CANCER SCREENING ANNUAL FOBT  04/28/2017  . FOOT EXAM  11/10/2017  . HEMOGLOBIN A1C  12/21/2017  . OPHTHALMOLOGY EXAM  02/24/2018  . TETANUS/TDAP  10/12/2020  . Hepatitis C Screening  Completed  . PNA vac Low Risk Adult  Completed  . INFLUENZA VACCINE  Discontinued  Declined mammogram but does self breast exams routinely.  Declined flu shot   Plan:  Chair exercises daily. Handout give.  Increase water intake. Aim for 4 bottles a day.  Move carefully to avoid falls.  Keep f/u with PCP Cologuard ordered  I have personally reviewed and noted the following in the patient's chart:   . Medical and social history . Use of alcohol, tobacco or illicit drugs  . Current medications and supplements . Functional ability and status . Nutritional status . Physical activity . Advanced directives . List of other physicians . Hospitalizations, surgeries, and ER visits in previous 12 months . Vitals . Screenings to include cognitive, depression, and falls . Referrals and appointments  In addition, I have reviewed and discussed with patient certain preventive protocols, quality metrics, and best practice recommendations. A written personalized care plan for preventive services as well as general preventive health recommendations were provided to patient.     Demetrios Loll, RN  08/30/2017     I have reviewed and agree with the above AWV documentation.   Jennifer Rodney, FNP

## 2017-08-30 NOTE — Patient Instructions (Signed)
  Ms. Threat , Thank you for taking time to come for your Medicare Wellness Visit. I appreciate your ongoing commitment to your health goals. Please review the following plan we discussed and let me know if I can assist you in the future.   These are the goals we discussed: Goals    . Exercise 150 min/wk Moderate Activity     Chair exercises daily         This is a list of the screening recommended for you and due dates:  Health Maintenance  Topic Date Due  . Mammogram  05/16/2011  . DEXA scan (bone density measurement)  08/28/2014  . Stool Blood Test  04/28/2017  . Complete foot exam   11/10/2017  . Hemoglobin A1C  12/21/2017  . Eye exam for diabetics  02/24/2018  . Tetanus Vaccine  10/12/2020  .  Hepatitis C: One time screening is recommended by Center for Disease Control  (CDC) for  adults born from [birth year] through 1966.   Completed  . Pneumonia vaccines  Completed  . Flu Shot  Discontinued     Your doctor has prescribed Cologuard, an easy-to-use, noninvasive test for colon cancer screening, based on the latest advances in stool DNA science.   Here's what will happen next:  1. You may receive a call or email from [facility] to confirm your mailing address and insurance information 2. Your kit will be shipped directly to you 3. You collet your stool sample in the privacy of your own home 4. You return the kit via [facility] shipping or pick-up, in the same box it arrived in 5. You doctor will contact you with the results once they are available  Screening for colon cancer is very important to your good health, so if you have any questions at all, please call [facility]'s Customer Support Specialists at [phone]. They are available 24 hours a day, 6 days a week.

## 2017-08-30 NOTE — Patient Instructions (Signed)

## 2017-08-30 NOTE — Progress Notes (Signed)
Subjective:    Patient ID: Jennifer Rasmussen, female    DOB: 1951/06/26, 67 y.o.   MRN: [id number]  PT presents to the office today for chronic follow up. Diabetes  She presents for her follow-up diabetic visit. She has type 2 diabetes mellitus. Her disease course has been stable. There are no hypoglycemic associated symptoms. Associated symptoms include fatigue. Pertinent negatives for diabetes include no blurred vision, no foot paresthesias and no visual change. There are no hypoglycemic complications. Symptoms are stable. Diabetic complications include heart disease and peripheral neuropathy. Pertinent negatives for diabetic complications include no CVA. Risk factors for coronary artery disease include dyslipidemia, diabetes mellitus, family history, obesity, hypertension, sedentary lifestyle, post-menopausal and tobacco exposure. She is following a generally healthy diet. Her breakfast blood glucose range is generally 140-180 mg/dl. An ACE inhibitor/angiotensin II receptor blocker is being taken.  Hypertension  This is a chronic problem. The current episode started more than 1 year ago. The problem has been resolved since onset. The problem is controlled. Associated symptoms include malaise/fatigue, peripheral edema and shortness of breath. Pertinent negatives include no blurred vision. Risk factors for coronary artery disease include diabetes mellitus, dyslipidemia, family history, obesity, smoking/tobacco exposure and sedentary lifestyle. The current treatment provides moderate improvement. There is no history of CAD/MI or CVA. Identifiable causes of hypertension include a thyroid problem.  Gastroesophageal Reflux  She complains of belching. She reports no coughing or no heartburn. This is a chronic problem. The current episode started more than 1 year ago. The problem occurs occasionally. The problem has been waxing and waning. The symptoms are aggravated by lying down. Associated symptoms include  fatigue. Risk factors include obesity. She has tried a PPI for the symptoms. The treatment provided moderate relief.  Arthritis  Presents for follow-up visit. She complains of pain and stiffness. Affected locations include the right knee and left knee (back). Her pain is at a severity of 8/10. Associated symptoms include fatigue.  Thyroid Problem  Presents for follow-up visit. Symptoms include constipation, dry skin and fatigue. Patient reports no visual change. The symptoms have been stable. Her past medical history is significant for hyperlipidemia.  Hyperlipidemia  This is a chronic problem. The current episode started more than 1 year ago. Exacerbating diseases include obesity. Associated symptoms include shortness of breath.  COPD Pt taking Symbicort BID. PT currently 1/2 pack a day.  Diabetic Neuropathy PT currently gabapentin 600 mg TID. States she has intermittent burning pain in bilateral hands.  Review of Systems  Constitutional: Positive for fatigue and malaise/fatigue.  Eyes: Negative for blurred vision.  Respiratory: Positive for shortness of breath. Negative for cough.   Gastrointestinal: Positive for constipation. Negative for heartburn.  Musculoskeletal: Positive for arthritis and stiffness.  All other systems reviewed and are negative.      Objective:   Physical Exam  Constitutional: She is oriented to person, place, and time. She appears well-developed and well-nourished. No distress.  Morbid obese   HENT:  Head: Normocephalic and atraumatic.  Right Ear: External ear normal.  Left Ear: External ear normal.  Nose: Nose normal.  Mouth/Throat: Oropharynx is clear and moist.  Eyes: Pupils are equal, round, and reactive to light.  Neck: Normal range of motion. Neck supple. No thyromegaly present.  Cardiovascular: Normal rate, regular rhythm, normal heart sounds and intact distal pulses.  No murmur heard. Pulmonary/Chest: Effort normal. No respiratory distress. She  has decreased breath sounds. She has no wheezes.  Abdominal: Soft. Bowel sounds are normal. She exhibits  no distension. There is no tenderness.  Musculoskeletal: Normal range of motion. She exhibits no edema or tenderness.  Neurological: She is alert and oriented to person, place, and time.  Generalized weakness, in wheelchair from deconditioning   Skin: Skin is warm and dry.  Psychiatric: She has a normal mood and affect. Her behavior is normal. Judgment and thought content normal.  Vitals reviewed.     BP 131/72   Pulse 72   Ht '5\' 3"'$  (1.6 m)   Wt 263 lb (119.3 kg)   LMP 10/24/2001   BMI 46.59 kg/m      Assessment & Plan:  1. Hypertension associated with diabetes ([place]) - CMP14+EGFR - Ambulatory referral to Cardiology  2. COPD exacerbation (HCC) - CMP14+EGFR - budesonide-formoterol (SYMBICORT) 160-4.5 MCG/ACT inhaler; USE 2 INHALATIONS TWICE A  DAY  Dispense: 30.6 g; Refill: 3 - carvedilol (COREG) 12.5 MG tablet; Take 1 tablet (12.5 mg total) by mouth 2 (two) times daily with a meal.  Dispense: 180 tablet; Refill: 1 - lisinopril (PRINIVIL,ZESTRIL) 10 MG tablet; Take 1 tablet (10 mg total) by mouth daily.  Dispense: 90 tablet; Refill: 0  3. Gastroesophageal reflux disease, esophagitis presence not specified - CMP14+EGFR  4. Type 2 diabetes mellitus with hyperglycemia, without long-term current use of insulin (HCC) - Bayer DCA Hb A1c Waived - CMP14+EGFR - glimepiride (AMARYL) 4 MG tablet; TAKE 2 TABLETS BY MOUTH  DAILY WITH BREAKFAST  Dispense: 180 tablet; Refill: 3 - empagliflozin (JARDIANCE) 25 MG TABS tablet; Take 25 mg by mouth daily.  Dispense: 90 tablet; Refill: 1 - Ambulatory referral to Cardiology  5. Hypothyroidism, unspecified type - CMP14+EGFR - TSH - levothyroxine (SYNTHROID, LEVOTHROID) 50 MCG tablet; Take 1 tablet (50 mcg total) by mouth daily.  Dispense: 90 tablet; Refill: 2  6. Hyperlipidemia associated with type 2 diabetes mellitus (HCC) - CMP14+EGFR -  Lipid panel - simvastatin (ZOCOR) 20 MG tablet; TAKE 1 TABLET BY MOUTH  DAILY AT 6PM  Dispense: 90 tablet; Refill: 1 - Ambulatory referral to Cardiology  7. Arthritis - CMP14+EGFR - meloxicam (MOBIC) 15 MG tablet; Take 1 tablet (15 mg total) by mouth daily.  Dispense: 90 tablet; Refill: 1 - traMADol (ULTRAM) 50 MG tablet; TAKE 2 TABLETS EVERY 8 HOURS AS NEEDED  Dispense: 180 tablet; Refill: 3  8. Morbid obesity ([place]) - CMP14+EGFR  9. Current smoker  - CMP14+EGFR - Ambulatory referral to Cardiology  10. Diabetic peripheral neuropathy (HCC) - CMP14+EGFR - traMADol (ULTRAM) 50 MG tablet; TAKE 2 TABLETS EVERY 8 HOURS AS NEEDED  Dispense: 180 tablet; Refill: 3  11. Essential hypertension, benign  12. Peripheral edema  - furosemide (LASIX) 20 MG tablet; Take 1 tablet (20 mg total) by mouth daily.  Dispense: 90 tablet; Refill: 1 - Ambulatory referral to Cardiology  13. Hyperlipidemia, unspecified hyperlipidemia type   Continue all meds Labs pending Health Maintenance reviewed Diet and exercise encouraged RTO 3 months   Evelina Dun, FNP

## 2017-08-31 ENCOUNTER — Encounter: Payer: Self-pay | Admitting: *Deleted

## 2017-08-31 LAB — CMP14+EGFR
ALBUMIN: 3.5 g/dL — AB (ref 3.6–4.8)
ALT: 5 IU/L (ref 0–32)
AST: 6 IU/L (ref 0–40)
Albumin/Globulin Ratio: 1.1 — ABNORMAL LOW (ref 1.2–2.2)
Alkaline Phosphatase: 116 IU/L (ref 39–117)
BILIRUBIN TOTAL: 0.3 mg/dL (ref 0.0–1.2)
BUN / CREAT RATIO: 15 (ref 12–28)
BUN: 14 mg/dL (ref 8–27)
CHLORIDE: 100 mmol/L (ref 96–106)
CO2: 27 mmol/L (ref 20–29)
Calcium: 9.1 mg/dL (ref 8.7–10.3)
Creatinine, Ser: 0.95 mg/dL (ref 0.57–1.00)
GFR, EST AFRICAN AMERICAN: 71 mL/min/{1.73_m2} (ref >59)
GFR, EST NON AFRICAN AMERICAN: 62 mL/min/{1.73_m2} (ref >59)
GLUCOSE: 106 mg/dL — AB (ref 65–99)
Globulin, Total: 3.1 g/dL (ref 1.5–4.5)
Potassium: 4.3 mmol/L (ref 3.5–5.2)
Sodium: 142 mmol/L (ref 134–144)
TOTAL PROTEIN: 6.6 g/dL (ref 6.0–8.5)

## 2017-08-31 LAB — LIPID PANEL
CHOL/HDL RATIO: 3.4 ratio (ref 0.0–4.4)
Cholesterol, Total: 151 mg/dL (ref 100–199)
HDL: 44 mg/dL (ref >39)
LDL CALC: 79 mg/dL (ref 0–99)
Triglycerides: 138 mg/dL (ref 0–149)
VLDL CHOLESTEROL CAL: 28 mg/dL (ref 5–40)

## 2017-08-31 LAB — TSH: TSH: 3.27 u[IU]/mL (ref 0.450–4.500)

## 2017-09-06 ENCOUNTER — Telehealth: Payer: Self-pay | Admitting: *Deleted

## 2017-09-06 NOTE — Telephone Encounter (Signed)
Fax RF request from [facility] for Gabapentin, not on current med list Please advise

## 2017-09-07 MED ORDER — GABAPENTIN 300 MG PO CAPS: 300.0000 mg | ORAL_CAPSULE | Freq: Three times a day (TID) | ORAL | 3 refills | Status: DC

## 2017-09-07 NOTE — Telephone Encounter (Signed)
Prescription sent to pharmacy.

## 2017-09-12 ENCOUNTER — Other Ambulatory Visit: Payer: Self-pay | Admitting: Family

## 2017-09-12 DIAGNOSIS — J441 Chronic obstructive pulmonary disease with (acute) exacerbation: Secondary | ICD-10-CM | POA: Diagnosis not present

## 2017-09-12 MED ORDER — GABAPENTIN 600 MG PO TABS: 600.0000 mg | ORAL_TABLET | Freq: Three times a day (TID) | ORAL | 0 refills | Status: DC

## 2017-10-10 DIAGNOSIS — J441 Chronic obstructive pulmonary disease with (acute) exacerbation: Secondary | ICD-10-CM | POA: Diagnosis not present

## 2017-10-23 ENCOUNTER — Other Ambulatory Visit: Payer: Self-pay | Admitting: Family

## 2017-10-23 ENCOUNTER — Telehealth: Payer: Self-pay | Admitting: Family

## 2017-10-23 DIAGNOSIS — J441 Chronic obstructive pulmonary disease with (acute) exacerbation: Secondary | ICD-10-CM

## 2017-10-23 NOTE — Telephone Encounter (Signed)
PT is wanting to talk to christy about getting a rx for a walker.

## 2017-10-24 NOTE — Telephone Encounter (Signed)
What type of walker?

## 2017-10-24 NOTE — Telephone Encounter (Signed)
Patient states she would like a wide walker with a seat for her to sit on if she gets tired.

## 2017-10-25 ENCOUNTER — Ambulatory Visit: Payer: Medicare Other | Admitting: Cardiology

## 2017-10-25 NOTE — Telephone Encounter (Signed)
Aware. 

## 2017-10-25 NOTE — Telephone Encounter (Signed)
RX ready for pick up 

## 2017-10-26 DIAGNOSIS — M545 Low back pain: Secondary | ICD-10-CM | POA: Diagnosis not present

## 2017-10-26 DIAGNOSIS — J441 Chronic obstructive pulmonary disease with (acute) exacerbation: Secondary | ICD-10-CM | POA: Diagnosis not present

## 2017-10-31 ENCOUNTER — Encounter (HOSPITAL_COMMUNITY): Payer: Self-pay

## 2017-11-05 ENCOUNTER — Other Ambulatory Visit: Payer: Self-pay | Admitting: Family

## 2017-11-10 DIAGNOSIS — J441 Chronic obstructive pulmonary disease with (acute) exacerbation: Secondary | ICD-10-CM | POA: Diagnosis not present

## 2017-11-28 ENCOUNTER — Ambulatory Visit (INDEPENDENT_AMBULATORY_CARE_PROVIDER_SITE_OTHER): Payer: Medicare Other | Admitting: Family

## 2017-11-28 ENCOUNTER — Encounter: Payer: Self-pay | Admitting: Family

## 2017-11-28 VITALS — BP 111/66 | HR 79 | Temp 97.8°F | Ht 63.0 in | Wt 252.0 lb

## 2017-11-28 DIAGNOSIS — E1159 Type 2 diabetes mellitus with other circulatory complications: Secondary | ICD-10-CM

## 2017-11-28 DIAGNOSIS — R609 Edema, unspecified: Secondary | ICD-10-CM | POA: Diagnosis not present

## 2017-11-28 DIAGNOSIS — E1169 Type 2 diabetes mellitus with other specified complication: Secondary | ICD-10-CM

## 2017-11-28 DIAGNOSIS — F17200 Nicotine dependence, unspecified, uncomplicated: Secondary | ICD-10-CM

## 2017-11-28 DIAGNOSIS — E039 Hypothyroidism, unspecified: Secondary | ICD-10-CM

## 2017-11-28 DIAGNOSIS — I10 Essential (primary) hypertension: Secondary | ICD-10-CM | POA: Diagnosis not present

## 2017-11-28 DIAGNOSIS — L03119 Cellulitis of unspecified part of limb: Secondary | ICD-10-CM | POA: Diagnosis not present

## 2017-11-28 DIAGNOSIS — E1142 Type 2 diabetes mellitus with diabetic polyneuropathy: Secondary | ICD-10-CM

## 2017-11-28 DIAGNOSIS — M79671 Pain in right foot: Secondary | ICD-10-CM | POA: Diagnosis not present

## 2017-11-28 DIAGNOSIS — E785 Hyperlipidemia, unspecified: Secondary | ICD-10-CM

## 2017-11-28 DIAGNOSIS — M1990 Unspecified osteoarthritis, unspecified site: Secondary | ICD-10-CM

## 2017-11-28 DIAGNOSIS — E1165 Type 2 diabetes mellitus with hyperglycemia: Secondary | ICD-10-CM | POA: Diagnosis not present

## 2017-11-28 LAB — BAYER DCA HB A1C WAIVED: HB A1C (BAYER DCA - WAIVED): 6.1 % (ref <7.0)

## 2017-11-28 MED ORDER — TRAMADOL HCL 50 MG PO TABS: ORAL_TABLET | ORAL | 3 refills | Status: DC

## 2017-11-28 MED ORDER — CEPHALEXIN 500 MG PO CAPS: 500.0000 mg | ORAL_CAPSULE | Freq: Three times a day (TID) | ORAL | 0 refills | Status: DC

## 2017-11-28 NOTE — Progress Notes (Signed)
Subjective:    Patient ID: Jennifer Rasmussen, female    DOB: June 13, 1951, 67 y.o.   MRN: [id number]  Chief Complaint  Patient presents with  . Diabetes    three months recheck  . Hypothyroidism  . right foot pain    Diabetes  She presents for her follow-up diabetic visit. She has type 2 diabetes mellitus. Her disease course has been stable. There are no hypoglycemic associated symptoms. Associated symptoms include blurred vision, fatigue and foot paresthesias. Pertinent negatives for diabetes include no visual change. There are no hypoglycemic complications. Symptoms are stable. Pertinent negatives for diabetic complications include no CVA, heart disease, nephropathy or peripheral neuropathy. Risk factors for coronary artery disease include diabetes mellitus, dyslipidemia, family history, hypertension and sedentary lifestyle. She is following a generally unhealthy diet. Her overall blood glucose range is 90-110 mg/dl. Eye exam is current.  Hypertension  This is a chronic problem. The current episode started more than 1 year ago. The problem has been resolved since onset. The problem is controlled. Associated symptoms include blurred vision, peripheral edema and shortness of breath. Risk factors for coronary artery disease include diabetes mellitus, dyslipidemia, obesity and sedentary lifestyle. Hypertensive end-organ damage includes kidney disease. There is no history of CVA. Identifiable causes of hypertension include a thyroid problem.  Thyroid Problem  Presents for follow-up visit. Symptoms include constipation, dry skin and fatigue. Patient reports no diarrhea or visual change. The symptoms have been stable. Her past medical history is significant for hyperlipidemia.  Hyperlipidemia  This is a chronic problem. The current episode started more than 1 year ago. The problem is uncontrolled. Recent lipid tests were reviewed and are high. Exacerbating diseases include obesity. Associated symptoms  include shortness of breath. Current antihyperlipidemic treatment includes statins. The current treatment provides moderate improvement of lipids. Risk factors for coronary artery disease include diabetes mellitus, dyslipidemia, family history, obesity and hypertension.  Foot Pain PT having right foot pain that started last week and becoming worse. States she noticed a knot on the top of her foot and has redness and warmth. Denies any trauma to the area.    Review of Systems  Constitutional: Positive for fatigue.  Eyes: Positive for blurred vision.  Respiratory: Positive for shortness of breath.   Gastrointestinal: Positive for constipation. Negative for diarrhea.  All other systems reviewed and are negative.      Objective:   Physical Exam  Constitutional: She is oriented to person, place, and time. She appears well-developed and well-nourished. No distress.  Morbid obese  HENT:  Head: Normocephalic and atraumatic.  Right Ear: External ear normal.  Left Ear: External ear normal.  Mouth/Throat: Oropharynx is clear and moist.  Eyes: Pupils are equal, round, and reactive to light.  Neck: Normal range of motion. Neck supple. No thyromegaly present.  Cardiovascular: Normal rate, regular rhythm, normal heart sounds and intact distal pulses.  No murmur heard. Pulmonary/Chest: Effort normal and breath sounds normal. No respiratory distress. She has no wheezes.  Abdominal: Soft. Bowel sounds are normal. She exhibits no distension. There is no tenderness.  Musculoskeletal: She exhibits edema and tenderness.  Right foot erythemas, fluid filled spot under fifth metatarsal  Neurological: She is alert and oriented to person, place, and time. She has normal reflexes. No cranial nerve deficit.  Skin: Skin is warm and dry.  Psychiatric: She has a normal mood and affect. Her behavior is normal. Judgment and thought content normal.  Vitals reviewed.       BP 111/66  Pulse 79   Temp 97.8 F  (36.6 C) (Oral)   Ht _0  (1.6 m)   Wt 252 lb (114.3 kg)   LMP 10/24/2001   BMI 44.64 kg/m      Assessment & Plan:  Jennifer Rasmussen comes in today with chief complaint of Diabetes (three months recheck); Hypothyroidism; right foot pain; and Medical Management of Chronic Issues   Diagnosis and orders addressed:  1. Hypertension associated with diabetes ([place]) - CMP14+EGFR - CBC with Differential/Platelet  2. Type 2 diabetes mellitus with hyperglycemia, without long-term current use of insulin (HCC) - CMP14+EGFR - Bayer DCA Hb A1c Waived - CBC with Differential/Platelet  3. Hypothyroidism, unspecified type - CMP14+EGFR - CBC with Differential/Platelet - TSH  4. Hyperlipidemia associated with type 2 diabetes mellitus (HCC) - CMP14+EGFR - CBC with Differential/Platelet  5. Diabetic peripheral neuropathy (HCC) - CMP14+EGFR - CBC with Differential/Platelet - traMADol (ULTRAM) 50 MG tablet; TAKE 2 TABLETS EVERY 8 HOURS AS NEEDED  Dispense: 180 tablet; Refill: 3  6. Morbid obesity ([place]) - CMP14+EGFR - CBC with Differential/Platelet  7. Current smoker - CMP14+EGFR - CBC with Differential/Platelet  8. Peripheral edema - CMP14+EGFR - CBC with Differential/Platelet  9. Right foot pain Will treat as cellulitis, area marked and discussed importance of call if erythemas increases Uric acid level pending as it could be gout Elevate foot - CMP14+EGFR - CBC with Differential/Platelet - Uric acid  10. Arthritis - traMADol (ULTRAM) 50 MG tablet; TAKE 2 TABLETS EVERY 8 HOURS AS NEEDED  Dispense: 180 tablet; Refill: 3  11. Cellulitis of foot Report any fever, increased erythemas  - cephALEXin (KEFLEX) 500 MG capsule; Take 1 capsule (500 mg total) by mouth 3 (three) times daily.  Dispense: 30 capsule; Refill: 0   Labs pending Health Maintenance reviewed Diet and exercise encouraged  Follow up plan: 2 weeks to recheck foot or sooner if symptoms worsen    Evelina Dun, FNP

## 2017-11-28 NOTE — Patient Instructions (Signed)

## 2017-11-29 LAB — CMP14+EGFR
ALK PHOS: 122 IU/L — AB (ref 39–117)
ALT: 5 IU/L (ref 0–32)
AST: 7 IU/L (ref 0–40)
Albumin/Globulin Ratio: 1.1 — ABNORMAL LOW (ref 1.2–2.2)
Albumin: 3.6 g/dL (ref 3.6–4.8)
BILIRUBIN TOTAL: 0.4 mg/dL (ref 0.0–1.2)
BUN/Creatinine Ratio: 17 (ref 12–28)
BUN: 18 mg/dL (ref 8–27)
CHLORIDE: 98 mmol/L (ref 96–106)
CO2: 27 mmol/L (ref 20–29)
CREATININE: 1.03 mg/dL — AB (ref 0.57–1.00)
Calcium: 9.5 mg/dL (ref 8.7–10.3)
GFR calc Af Amer: 65 mL/min/{1.73_m2} (ref >59)
GFR calc non Af Amer: 56 mL/min/{1.73_m2} — ABNORMAL LOW (ref >59)
GLUCOSE: 138 mg/dL — AB (ref 65–99)
Globulin, Total: 3.4 g/dL (ref 1.5–4.5)
Potassium: 4.9 mmol/L (ref 3.5–5.2)
Sodium: 141 mmol/L (ref 134–144)
Total Protein: 7 g/dL (ref 6.0–8.5)

## 2017-11-29 LAB — CBC WITH DIFFERENTIAL/PLATELET
BASOS ABS: 0 10*3/uL (ref 0.0–0.2)
Basos: 0 %
EOS (ABSOLUTE): 0.3 10*3/uL (ref 0.0–0.4)
EOS: 2 %
HEMATOCRIT: 41.2 % (ref 34.0–46.6)
Hemoglobin: 12.8 g/dL (ref 11.1–15.9)
IMMATURE GRANULOCYTES: 0 %
Immature Grans (Abs): 0 10*3/uL (ref 0.0–0.1)
Lymphocytes Absolute: 2.3 10*3/uL (ref 0.7–3.1)
Lymphs: 19 %
MCH: 24.7 pg — ABNORMAL LOW (ref 26.6–33.0)
MCHC: 31.1 g/dL — AB (ref 31.5–35.7)
MCV: 79 fL (ref 79–97)
Monocytes Absolute: 0.8 10*3/uL (ref 0.1–0.9)
Monocytes: 7 %
NEUTROS PCT: 72 %
Neutrophils Absolute: 8.5 10*3/uL — ABNORMAL HIGH (ref 1.4–7.0)
PLATELETS: 334 10*3/uL (ref 150–379)
RBC: 5.19 x10E6/uL (ref 3.77–5.28)
RDW: 16.9 % — AB (ref 12.3–15.4)
WBC: 12 10*3/uL — AB (ref 3.4–10.8)

## 2017-11-29 LAB — URIC ACID: URIC ACID: 5.9 mg/dL (ref 2.5–7.1)

## 2017-11-29 LAB — TSH: TSH: 2.68 u[IU]/mL (ref 0.450–4.500)

## 2017-11-30 ENCOUNTER — Encounter: Payer: Self-pay | Admitting: *Deleted

## 2017-11-30 ENCOUNTER — Other Ambulatory Visit: Payer: Self-pay | Admitting: Family

## 2017-12-10 DIAGNOSIS — J441 Chronic obstructive pulmonary disease with (acute) exacerbation: Secondary | ICD-10-CM | POA: Diagnosis not present

## 2018-01-08 ENCOUNTER — Emergency Department (HOSPITAL_COMMUNITY): Payer: Medicare Other

## 2018-01-08 ENCOUNTER — Inpatient Hospital Stay (HOSPITAL_COMMUNITY): Payer: Medicare Other

## 2018-01-08 ENCOUNTER — Inpatient Hospital Stay (HOSPITAL_COMMUNITY): Payer: Medicare Other | Admitting: Anesthesiology

## 2018-01-08 ENCOUNTER — Encounter (HOSPITAL_COMMUNITY)
Admission: EM | Disposition: A | Discharge status: Home / Self Care | Payer: Self-pay | Source: Home / Self Care | Attending: Family Medicine

## 2018-01-08 ENCOUNTER — Encounter (HOSPITAL_COMMUNITY): Payer: Self-pay | Admitting: Emergency Medicine

## 2018-01-08 ENCOUNTER — Other Ambulatory Visit: Payer: Self-pay

## 2018-01-08 ENCOUNTER — Inpatient Hospital Stay (HOSPITAL_COMMUNITY)
Admission: EM | Admit: 2018-01-08 | Discharge: 2018-01-12 | DRG: 853 | Disposition: A | Discharge status: Skilled Nursing Facility | Payer: Medicare Other | Attending: Family Medicine | Admitting: Family Medicine

## 2018-01-08 DIAGNOSIS — F17210 Nicotine dependence, cigarettes, uncomplicated: Secondary | ICD-10-CM | POA: Diagnosis present

## 2018-01-08 DIAGNOSIS — A4151 Sepsis due to Escherichia coli [E. coli]: Secondary | ICD-10-CM | POA: Diagnosis not present

## 2018-01-08 DIAGNOSIS — A419 Sepsis, unspecified organism: Secondary | ICD-10-CM | POA: Diagnosis not present

## 2018-01-08 DIAGNOSIS — J810 Acute pulmonary edema: Secondary | ICD-10-CM | POA: Diagnosis not present

## 2018-01-08 DIAGNOSIS — K219 Gastro-esophageal reflux disease without esophagitis: Secondary | ICD-10-CM | POA: Diagnosis not present

## 2018-01-08 DIAGNOSIS — J449 Chronic obstructive pulmonary disease, unspecified: Secondary | ICD-10-CM

## 2018-01-08 DIAGNOSIS — M1990 Unspecified osteoarthritis, unspecified site: Secondary | ICD-10-CM

## 2018-01-08 DIAGNOSIS — Z7401 Bed confinement status: Secondary | ICD-10-CM | POA: Diagnosis not present

## 2018-01-08 DIAGNOSIS — E039 Hypothyroidism, unspecified: Secondary | ICD-10-CM | POA: Diagnosis not present

## 2018-01-08 DIAGNOSIS — Z8249 Family history of ischemic heart disease and other diseases of the circulatory system: Secondary | ICD-10-CM

## 2018-01-08 DIAGNOSIS — E785 Hyperlipidemia, unspecified: Secondary | ICD-10-CM | POA: Diagnosis present

## 2018-01-08 DIAGNOSIS — E860 Dehydration: Secondary | ICD-10-CM | POA: Diagnosis not present

## 2018-01-08 DIAGNOSIS — Z9181 History of falling: Secondary | ICD-10-CM | POA: Diagnosis not present

## 2018-01-08 DIAGNOSIS — K8020 Calculus of gallbladder without cholecystitis without obstruction: Secondary | ICD-10-CM | POA: Diagnosis present

## 2018-01-08 DIAGNOSIS — E162 Hypoglycemia, unspecified: Secondary | ICD-10-CM | POA: Diagnosis not present

## 2018-01-08 DIAGNOSIS — R059 Cough, unspecified: Secondary | ICD-10-CM

## 2018-01-08 DIAGNOSIS — Z7984 Long term (current) use of oral hypoglycemic drugs: Secondary | ICD-10-CM

## 2018-01-08 DIAGNOSIS — E11649 Type 2 diabetes mellitus with hypoglycemia without coma: Secondary | ICD-10-CM | POA: Diagnosis not present

## 2018-01-08 DIAGNOSIS — Z96653 Presence of artificial knee joint, bilateral: Secondary | ICD-10-CM | POA: Diagnosis present

## 2018-01-08 DIAGNOSIS — G4733 Obstructive sleep apnea (adult) (pediatric): Secondary | ICD-10-CM | POA: Diagnosis not present

## 2018-01-08 DIAGNOSIS — J811 Chronic pulmonary edema: Secondary | ICD-10-CM

## 2018-01-08 DIAGNOSIS — Z9884 Bariatric surgery status: Secondary | ICD-10-CM

## 2018-01-08 DIAGNOSIS — E118 Type 2 diabetes mellitus with unspecified complications: Secondary | ICD-10-CM | POA: Diagnosis not present

## 2018-01-08 DIAGNOSIS — I10 Essential (primary) hypertension: Secondary | ICD-10-CM | POA: Diagnosis not present

## 2018-01-08 DIAGNOSIS — E1169 Type 2 diabetes mellitus with other specified complication: Secondary | ICD-10-CM | POA: Diagnosis present

## 2018-01-08 DIAGNOSIS — Z79899 Other long term (current) drug therapy: Secondary | ICD-10-CM

## 2018-01-08 DIAGNOSIS — N179 Acute kidney failure, unspecified: Secondary | ICD-10-CM | POA: Diagnosis present

## 2018-01-08 DIAGNOSIS — N132 Hydronephrosis with renal and ureteral calculous obstruction: Secondary | ICD-10-CM | POA: Diagnosis not present

## 2018-01-08 DIAGNOSIS — N202 Calculus of kidney with calculus of ureter: Secondary | ICD-10-CM | POA: Diagnosis not present

## 2018-01-08 DIAGNOSIS — Z79890 Hormone replacement therapy (postmenopausal): Secondary | ICD-10-CM | POA: Diagnosis not present

## 2018-01-08 DIAGNOSIS — N200 Calculus of kidney: Secondary | ICD-10-CM | POA: Diagnosis not present

## 2018-01-08 DIAGNOSIS — N136 Pyonephrosis: Secondary | ICD-10-CM | POA: Diagnosis not present

## 2018-01-08 DIAGNOSIS — Z833 Family history of diabetes mellitus: Secondary | ICD-10-CM

## 2018-01-08 DIAGNOSIS — Z6841 Body Mass Index (BMI) 40.0 and over, adult: Secondary | ICD-10-CM

## 2018-01-08 DIAGNOSIS — Z7951 Long term (current) use of inhaled steroids: Secondary | ICD-10-CM

## 2018-01-08 DIAGNOSIS — N201 Calculus of ureter: Secondary | ICD-10-CM | POA: Diagnosis not present

## 2018-01-08 DIAGNOSIS — Z87442 Personal history of urinary calculi: Secondary | ICD-10-CM | POA: Diagnosis not present

## 2018-01-08 DIAGNOSIS — Z7982 Long term (current) use of aspirin: Secondary | ICD-10-CM

## 2018-01-08 DIAGNOSIS — R109 Unspecified abdominal pain: Secondary | ICD-10-CM | POA: Diagnosis not present

## 2018-01-08 DIAGNOSIS — E1140 Type 2 diabetes mellitus with diabetic neuropathy, unspecified: Secondary | ICD-10-CM | POA: Diagnosis not present

## 2018-01-08 DIAGNOSIS — R000 Tachycardia, unspecified: Secondary | ICD-10-CM | POA: Diagnosis not present

## 2018-01-08 DIAGNOSIS — E1142 Type 2 diabetes mellitus with diabetic polyneuropathy: Secondary | ICD-10-CM | POA: Diagnosis present

## 2018-01-08 DIAGNOSIS — R05 Cough: Secondary | ICD-10-CM | POA: Diagnosis not present

## 2018-01-08 DIAGNOSIS — Z9981 Dependence on supplemental oxygen: Secondary | ICD-10-CM | POA: Diagnosis not present

## 2018-01-08 DIAGNOSIS — Z79891 Long term (current) use of opiate analgesic: Secondary | ICD-10-CM

## 2018-01-08 DIAGNOSIS — I119 Hypertensive heart disease without heart failure: Secondary | ICD-10-CM | POA: Diagnosis not present

## 2018-01-08 HISTORY — PX: CYSTOSCOPY WITH STENT PLACEMENT: SHX5790

## 2018-01-08 LAB — COMPREHENSIVE METABOLIC PANEL
ALT: 11 U/L — ABNORMAL LOW (ref 14–54)
ANION GAP: 12 (ref 5–15)
AST: 15 U/L (ref 15–41)
Albumin: 2.5 g/dL — ABNORMAL LOW (ref 3.5–5.0)
Alkaline Phosphatase: 114 U/L (ref 38–126)
BILIRUBIN TOTAL: 0.9 mg/dL (ref 0.3–1.2)
BUN: 57 mg/dL — AB (ref 6–20)
CO2: 27 mmol/L (ref 22–32)
Calcium: 8.3 mg/dL — ABNORMAL LOW (ref 8.9–10.3)
Chloride: 97 mmol/L — ABNORMAL LOW (ref 101–111)
Creatinine, Ser: 2.77 mg/dL — ABNORMAL HIGH (ref 0.44–1.00)
GFR, EST AFRICAN AMERICAN: 19 mL/min — AB (ref >60)
GFR, EST NON AFRICAN AMERICAN: 17 mL/min — AB (ref >60)
Glucose, Bld: 157 mg/dL — ABNORMAL HIGH (ref 65–99)
Potassium: 4.9 mmol/L (ref 3.5–5.1)
Sodium: 136 mmol/L (ref 135–145)
TOTAL PROTEIN: 6.8 g/dL (ref 6.5–8.1)

## 2018-01-08 LAB — URINALYSIS, ROUTINE W REFLEX MICROSCOPIC
Bacteria, UA: NONE SEEN
Bilirubin Urine: NEGATIVE
Ketones, ur: NEGATIVE mg/dL
Nitrite: NEGATIVE
PH: 5 (ref 5.0–8.0)
PROTEIN: 30 mg/dL — AB
SPECIFIC GRAVITY, URINE: 1.037 — AB (ref 1.005–1.030)
WBC, UA: >50 WBC/hpf — ABNORMAL HIGH (ref 0–5)

## 2018-01-08 LAB — LACTIC ACID, PLASMA
LACTIC ACID, VENOUS: 1.5 mmol/L (ref 0.5–1.9)
Lactic Acid, Venous: 2.4 mmol/L (ref 0.5–1.9)

## 2018-01-08 LAB — TROPONIN I: Troponin I: <0.03 ng/mL (ref <0.03)

## 2018-01-08 LAB — CBC WITH DIFFERENTIAL/PLATELET
Basophils Absolute: 0 10*3/uL (ref 0.0–0.1)
Basophils Relative: 0 %
EOS PCT: 0 %
Eosinophils Absolute: 0 10*3/uL (ref 0.0–0.7)
HEMATOCRIT: 37.5 % (ref 36.0–46.0)
Hemoglobin: 11.9 g/dL — ABNORMAL LOW (ref 12.0–15.0)
Lymphocytes Relative: 4 %
Lymphs Abs: 0.8 10*3/uL (ref 0.7–4.0)
MCH: 24.5 pg — AB (ref 26.0–34.0)
MCHC: 31.7 g/dL (ref 30.0–36.0)
MCV: 77.3 fL — AB (ref 78.0–100.0)
MONO ABS: 0.3 10*3/uL (ref 0.1–1.0)
MONOS PCT: 1 %
NEUTROS ABS: 17.2 10*3/uL — AB (ref 1.7–7.7)
Neutrophils Relative %: 95 %
Platelets: 132 10*3/uL — ABNORMAL LOW (ref 150–400)
RBC: 4.85 MIL/uL (ref 3.87–5.11)
RDW: 16.5 % — AB (ref 11.5–15.5)
WBC: 18.3 10*3/uL — ABNORMAL HIGH (ref 4.0–10.5)

## 2018-01-08 LAB — GLUCOSE, CAPILLARY
GLUCOSE-CAPILLARY: 69 mg/dL (ref 65–99)
Glucose-Capillary: 104 mg/dL — ABNORMAL HIGH (ref 65–99)

## 2018-01-08 LAB — LIPASE, BLOOD: LIPASE: 12 U/L (ref 11–51)

## 2018-01-08 LAB — CBG MONITORING, ED: Glucose-Capillary: 97 mg/dL (ref 65–99)

## 2018-01-08 SURGERY — CYSTOSCOPY, WITH STENT INSERTION
Anesthesia: General | Laterality: Right

## 2018-01-08 MED ORDER — ONDANSETRON HCL 4 MG/2ML IJ SOLN: 4.0000 mg | Freq: Four times a day (QID) | INTRAMUSCULAR | Status: DC | PRN

## 2018-01-08 MED ORDER — SODIUM CHLORIDE 0.9 % IV BOLUS
1000.0000 mL | Freq: Once | INTRAVENOUS | Status: AC
  Administered 2018-01-08: 1000 mL via INTRAVENOUS

## 2018-01-08 MED ORDER — PHENYLEPHRINE HCL 10 MG/ML IJ SOLN
INTRAMUSCULAR | Status: DC | PRN
  Administered 2018-01-08 (×5): 100 ug via INTRAVENOUS

## 2018-01-08 MED ORDER — HYDROMORPHONE HCL 1 MG/ML IJ SOLN
INTRAMUSCULAR | Status: AC
Start: 2018-01-08 — End: ?
  Filled 2018-01-08: qty 0.5

## 2018-01-08 MED ORDER — IOPAMIDOL (ISOVUE-300) INJECTION 61%
100.0000 mL | Freq: Once | INTRAVENOUS | Status: AC | PRN
  Administered 2018-01-08: 100 mL via INTRAVENOUS

## 2018-01-08 MED ORDER — PROPOFOL 10 MG/ML IV BOLUS
INTRAVENOUS | Status: DC | PRN
  Administered 2018-01-08: 100 mg via INTRAVENOUS
  Administered 2018-01-08: 50 mg via INTRAVENOUS

## 2018-01-08 MED ORDER — LEVOTHYROXINE SODIUM 50 MCG PO TABS
50.0000 ug | ORAL_TABLET | Freq: Every day | ORAL | Status: DC
Start: 2018-01-09 — End: 2018-01-12
  Administered 2018-01-09 – 2018-01-12 (×4): 50 ug via ORAL
  Filled 2018-01-08 (×2): qty 2
  Filled 2018-01-08 (×2): qty 1

## 2018-01-08 MED ORDER — SIMVASTATIN 20 MG PO TABS
20.0000 mg | ORAL_TABLET | Freq: Every day | ORAL | Status: DC
  Administered 2018-01-09 – 2018-01-11 (×3): 20 mg via ORAL
  Filled 2018-01-08 (×3): qty 1

## 2018-01-08 MED ORDER — KETOROLAC TROMETHAMINE 30 MG/ML IJ SOLN
30.0000 mg | Freq: Once | INTRAMUSCULAR | Status: AC | PRN
  Administered 2018-01-08: 30 mg via INTRAVENOUS

## 2018-01-08 MED ORDER — ACETAMINOPHEN 325 MG PO TABS
650.0000 mg | ORAL_TABLET | Freq: Once | ORAL | Status: AC
  Administered 2018-01-08: 650 mg via ORAL
  Filled 2018-01-08: qty 2

## 2018-01-08 MED ORDER — SUCCINYLCHOLINE CHLORIDE 20 MG/ML IJ SOLN
INTRAMUSCULAR | Status: AC
  Filled 2018-01-08: qty 1

## 2018-01-08 MED ORDER — LIDOCAINE HCL (PF) 1 % IJ SOLN
INTRAMUSCULAR | Status: AC
  Filled 2018-01-08: qty 5

## 2018-01-08 MED ORDER — TRIAMCINOLONE ACETONIDE 0.1 % EX CREA
1.0000 "application " | TOPICAL_CREAM | Freq: Two times a day (BID) | CUTANEOUS | Status: DC
  Administered 2018-01-09 – 2018-01-12 (×7): 1 via TOPICAL
  Filled 2018-01-08: qty 15

## 2018-01-08 MED ORDER — HYDROMORPHONE HCL 1 MG/ML IJ SOLN
0.2500 mg | INTRAMUSCULAR | Status: DC | PRN
  Administered 2018-01-08: 0.5 mg via INTRAVENOUS

## 2018-01-08 MED ORDER — MEPERIDINE HCL 50 MG/ML IJ SOLN: 6.2500 mg | INTRAMUSCULAR | Status: DC | PRN

## 2018-01-08 MED ORDER — PROPOFOL 10 MG/ML IV BOLUS
INTRAVENOUS | Status: AC
  Filled 2018-01-08: qty 20

## 2018-01-08 MED ORDER — MOMETASONE FURO-FORMOTEROL FUM 200-5 MCG/ACT IN AERO
2.0000 | INHALATION_SPRAY | Freq: Two times a day (BID) | RESPIRATORY_TRACT | Status: DC
  Administered 2018-01-09 – 2018-01-12 (×7): 2 via RESPIRATORY_TRACT
  Filled 2018-01-08 (×2): qty 8.8

## 2018-01-08 MED ORDER — DIATRIZOATE MEGLUMINE 30 % UR SOLN
URETHRAL | Status: DC | PRN
  Administered 2018-01-08: 20 mL via URETHRAL

## 2018-01-08 MED ORDER — FENTANYL CITRATE (PF) 100 MCG/2ML IJ SOLN
INTRAMUSCULAR | Status: DC | PRN
  Administered 2018-01-08: 100 ug via INTRAVENOUS

## 2018-01-08 MED ORDER — SUCCINYLCHOLINE CHLORIDE 20 MG/ML IJ SOLN
INTRAMUSCULAR | Status: DC | PRN
  Administered 2018-01-08: 100 mg via INTRAVENOUS

## 2018-01-08 MED ORDER — DIATRIZOATE MEGLUMINE 30 % UR SOLN
URETHRAL | Status: AC
  Filled 2018-01-08: qty 100

## 2018-01-08 MED ORDER — KETOROLAC TROMETHAMINE 30 MG/ML IJ SOLN
INTRAMUSCULAR | Status: AC
  Filled 2018-01-08: qty 1

## 2018-01-08 MED ORDER — SODIUM CHLORIDE 0.9 % IV SOLN
1.0000 g | Freq: Once | INTRAVENOUS | Status: AC
  Administered 2018-01-08: 1 g via INTRAVENOUS
  Filled 2018-01-08: qty 10

## 2018-01-08 MED ORDER — GABAPENTIN 300 MG PO CAPS
600.0000 mg | ORAL_CAPSULE | Freq: Three times a day (TID) | ORAL | Status: DC
  Administered 2018-01-09: 600 mg via ORAL
  Filled 2018-01-08 (×2): qty 2

## 2018-01-08 MED ORDER — SODIUM CHLORIDE 0.9 % IV SOLN
INTRAVENOUS | Status: DC | PRN
  Administered 2018-01-08 (×2): via INTRAVENOUS

## 2018-01-08 MED ORDER — INSULIN ASPART 100 UNIT/ML ~~LOC~~ SOLN: 0.0000 [IU] | SUBCUTANEOUS | Status: DC

## 2018-01-08 MED ORDER — IPRATROPIUM-ALBUTEROL 0.5-2.5 (3) MG/3ML IN SOLN
3.0000 mL | Freq: Four times a day (QID) | RESPIRATORY_TRACT | Status: DC | PRN
  Administered 2018-01-09 – 2018-01-10 (×2): 3 mL via RESPIRATORY_TRACT
  Filled 2018-01-08 (×2): qty 3

## 2018-01-08 MED ORDER — FENTANYL CITRATE (PF) 100 MCG/2ML IJ SOLN
INTRAMUSCULAR | Status: AC
  Filled 2018-01-08: qty 2

## 2018-01-08 MED ORDER — PHENYLEPHRINE 40 MCG/ML (10ML) SYRINGE FOR IV PUSH (FOR BLOOD PRESSURE SUPPORT)
PREFILLED_SYRINGE | INTRAVENOUS | Status: AC
  Filled 2018-01-08: qty 10

## 2018-01-08 MED ORDER — SODIUM CHLORIDE 0.9 % IR SOLN
Status: DC | PRN
  Administered 2018-01-08: 3000 mL via INTRAVESICAL

## 2018-01-08 MED ORDER — ACETAMINOPHEN 650 MG RE SUPP: 650.0000 mg | Freq: Four times a day (QID) | RECTAL | Status: DC | PRN

## 2018-01-08 MED ORDER — MORPHINE SULFATE (PF) 4 MG/ML IV SOLN
4.0000 mg | Freq: Once | INTRAVENOUS | Status: AC
  Administered 2018-01-08: 4 mg via INTRAVENOUS
  Filled 2018-01-08: qty 1

## 2018-01-08 MED ORDER — ACETAMINOPHEN 325 MG PO TABS: 650.0000 mg | ORAL_TABLET | Freq: Four times a day (QID) | ORAL | Status: DC | PRN

## 2018-01-08 MED ORDER — LACTATED RINGERS IV SOLN: INTRAVENOUS | Status: DC

## 2018-01-08 MED ORDER — HYDROCODONE-ACETAMINOPHEN 7.5-325 MG PO TABS: 1.0000 | ORAL_TABLET | Freq: Once | ORAL | Status: DC | PRN

## 2018-01-08 MED ORDER — DEXTROSE 50 % IV SOLN
25.0000 mL | Freq: Once | INTRAVENOUS | Status: AC
  Administered 2018-01-08: 25 mL via INTRAVENOUS

## 2018-01-08 MED ORDER — ONDANSETRON HCL 4 MG/2ML IJ SOLN: 4.0000 mg | Freq: Once | INTRAMUSCULAR | Status: DC | PRN

## 2018-01-08 MED ORDER — OXYCODONE-ACETAMINOPHEN 5-325 MG PO TABS
1.0000 | ORAL_TABLET | ORAL | Status: DC | PRN
  Administered 2018-01-09: 1 via ORAL
  Administered 2018-01-09 – 2018-01-12 (×14): 2 via ORAL
  Filled 2018-01-08 (×4): qty 2
  Filled 2018-01-08: qty 1
  Filled 2018-01-08 (×10): qty 2

## 2018-01-08 MED ORDER — ONDANSETRON HCL 4 MG/2ML IJ SOLN
4.0000 mg | Freq: Once | INTRAMUSCULAR | Status: AC
  Administered 2018-01-08: 4 mg via INTRAVENOUS
  Filled 2018-01-08: qty 2

## 2018-01-08 MED ORDER — DEXTROSE 50 % IV SOLN
INTRAVENOUS | Status: AC
  Filled 2018-01-08: qty 50

## 2018-01-08 MED ORDER — ONDANSETRON HCL 4 MG PO TABS
4.0000 mg | ORAL_TABLET | Freq: Four times a day (QID) | ORAL | Status: DC | PRN
  Administered 2018-01-09: 4 mg via ORAL
  Filled 2018-01-08: qty 1

## 2018-01-08 MED ORDER — LACTATED RINGERS IV SOLN
INTRAVENOUS | Status: DC
  Administered 2018-01-08 – 2018-01-09 (×3): via INTRAVENOUS

## 2018-01-08 MED ORDER — VITAMIN D 1000 UNITS PO TABS
2000.0000 [IU] | ORAL_TABLET | Freq: Every day | ORAL | Status: DC
  Administered 2018-01-09 – 2018-01-12 (×4): 2000 [IU] via ORAL
  Filled 2018-01-08 (×5): qty 2

## 2018-01-08 MED ORDER — SODIUM CHLORIDE 0.9 % IV SOLN
1.0000 g | INTRAVENOUS | Status: DC
  Filled 2018-01-08: qty 10

## 2018-01-08 SURGICAL SUPPLY — 20 items
BAG URINE DRAINAGE (UROLOGICAL SUPPLIES) ×2 IMPLANT
CATH COUDE FOLEY 2W 5CC 18FR (CATHETERS) ×2 IMPLANT
CATH URET 5FR 28IN OPEN ENDED (CATHETERS) ×3 IMPLANT
CLOTH BEACON ORANGE TIMEOUT ST (SAFETY) ×3 IMPLANT
GLOVE BIOGEL PI IND STRL 6.5 (GLOVE) ×1 IMPLANT
GLOVE BIOGEL PI IND STRL 7.5 (GLOVE) ×1 IMPLANT
GLOVE BIOGEL PI INDICATOR 6.5 (GLOVE) ×2
GLOVE BIOGEL PI INDICATOR 7.5 (GLOVE) ×2
GLOVE ECLIPSE 6.5 STRL STRAW (GLOVE) ×3 IMPLANT
GLOVE SURG SS PI 8.0 STRL IVOR (GLOVE) ×3 IMPLANT
GOWN STRL REUS W/TWL LRG LVL3 (GOWN DISPOSABLE) ×3 IMPLANT
GOWN STRL REUS W/TWL XL LVL3 (GOWN DISPOSABLE) ×3 IMPLANT
GUIDEWIRE STR DUAL SENSOR (WIRE) ×3 IMPLANT
IV NS IRRIG 3000ML ARTHROMATIC (IV SOLUTION) ×2 IMPLANT
KIT TURNOVER CYSTO (KITS) ×3 IMPLANT
NS IRRIG 500ML POUR BTL (IV SOLUTION) ×2 IMPLANT
PACK CYSTO (CUSTOM PROCEDURE TRAY) ×3 IMPLANT
PAD ARMBOARD 7.5X6 YLW CONV (MISCELLANEOUS) ×3 IMPLANT
STENT URET 6FRX24 CONTOUR (STENTS) ×3 IMPLANT
TOWEL OR 17X26 4PK STRL BLUE (TOWEL DISPOSABLE) ×3 IMPLANT

## 2018-01-08 NOTE — ED Triage Notes (Signed)
PT c/o nausea with vomiting and generalized abdominal discomfort and chills x3 days. PT states she also had one episode of diarrhea today.

## 2018-01-08 NOTE — Transfer of Care (Signed)
Immediate Anesthesia Transfer of Care Note  [facility]  Procedure(s) Performed: CYSTOSCOPY WITH STENT PLACEMENT (Right )  Patient Location: PACU  Anesthesia Type:General  Level of Consciousness: sedated and patient cooperative  Airway & Oxygen Therapy: Patient Spontanous Breathing and Patient connected to face mask oxygen  Post-op Assessment: Report given to RN, Post -op Vital signs reviewed and stable and Patient moving all extremities X 4  Post vital signs: Reviewed  Last Vitals:  Vitals Value Taken Time  BP    Temp 37 C 01/08/2018  9:59 PM  Pulse 103 01/08/2018 10:06 PM  Resp 22 01/08/2018 10:06 PM  SpO2 100 % 01/08/2018 10:06 PM  Vitals shown include unvalidated device data.  Last Pain:  Vitals:   01/08/18 2040  TempSrc:   PainSc: 10-Worst pain ever         Complications: No apparent anesthesia complications

## 2018-01-08 NOTE — ED Notes (Signed)
Pt taken to PACU by Anesthesia.

## 2018-01-08 NOTE — Anesthesia Procedure Notes (Signed)
Procedure Name: Intubation Date/Time: 01/08/2018 9:15 PM Performed by: Nicanor Alcon, MD Pre-anesthesia Checklist: Patient identified, Patient being monitored, Timeout performed, Emergency Drugs available and Suction available Patient Re-evaluated:Patient Re-evaluated prior to induction Oxygen Delivery Method: Circle System Utilized Preoxygenation: Pre-oxygenation with 100% oxygen Induction Type: IV induction Ventilation: Mask ventilation without difficulty Laryngoscope Size: Mac and 4 Grade View: Grade I Tube type: Oral Tube size: 7.0 mm Number of attempts: 1 Airway Equipment and Method: Stylet Placement Confirmation: ETT inserted through vocal cords under direct vision,  positive ETCO2 and breath sounds checked- equal and bilateral Secured at: 21 cm Tube secured with: Tape Dental Injury: Teeth and Oropharynx as per pre-operative assessment

## 2018-01-08 NOTE — Anesthesia Preprocedure Evaluation (Signed)
Anesthesia Evaluation  Patient identified by MRN, date of birth, ID band Patient awake    Reviewed: Allergy & Precautions, H&P , NPO status , Patient's Chart, lab work & pertinent test results, reviewed documented beta blocker date and time   Airway Mallampati: II  TM Distance: >3 FB Neck ROM: full    Dental no notable dental hx. (+) Edentulous Upper, Edentulous Lower   Pulmonary neg pulmonary ROS, sleep apnea , COPD, Current Smoker,    Pulmonary exam normal breath sounds clear to auscultation       Cardiovascular Exercise Tolerance: Good hypertension, negative cardio ROS   Rhythm:regular Rate:Normal     Neuro/Psych  Neuromuscular disease negative neurological ROS  negative psych ROS   GI/Hepatic negative GI ROS, Neg liver ROS, PUD, GERD  ,  Endo/Other  negative endocrine ROSdiabetesHypothyroidism Morbid obesity  Renal/GU Renal diseasenegative Renal ROS  negative genitourinary   Musculoskeletal   Abdominal   Peds  Hematology negative hematology ROS (+)   Anesthesia Other Findings   Reproductive/Obstetrics negative OB ROS                             Anesthesia Physical Anesthesia Plan  ASA: III and emergent  Anesthesia Plan: General   Post-op Pain Management:    Induction:   PONV Risk Score and Plan:   Airway Management Planned:   Additional Equipment:   Intra-op Plan:   Post-operative Plan:   Informed Consent: I have reviewed the patients History and Physical, chart, labs and discussed the procedure including the risks, benefits and alternatives for the proposed anesthesia with the patient or authorized representative who has indicated his/her understanding and acceptance.   Dental Advisory Given  Plan Discussed with: CRNA  Anesthesia Plan Comments:         Anesthesia Quick Evaluation

## 2018-01-08 NOTE — H&P (Addendum)
History and Physical    Jennifer Rasmussen:[id number] DOB: 29-May-1951 DOA: 01/08/2018  PCP: Jennifer Rasmussen   Patient coming from: Home  Chief Complaint: Abdominal pain  HPI: Jennifer Rasmussen is a 67 y.o. female with medical history significant for type 2 diabetes, morbid obesity, hypothyroidism, COPD, OSA on 3 L nasal cannula at night, hypertension, dyslipidemia, and GERD who began having some lower right-sided abdominal pain that began on Friday of last week.  This was associated with some fever and chills as well as recurrent episodes of nausea and vomiting with no hematemesis.  She was also noted to have an episode of diarrhea yesterday.  She states that her pain is worse when she coughs or pushes on her abdomen in that region.  She appears to have no significant alleviating factors.  The pain has been worsening over the course of the weekend.  ED Course: Vital signs demonstrate temperature 100.6 F as well as pulse of 126 and some soft blood pressure readings of 105/54.  Labs demonstrate leukocytosis of 18,000, hemoglobin 12.8 that is stable, BUN 57, and creatinine 2.77 with prior creatinine of 1.03 on 11/2017.  Her current glucose is 157.  Chest x-ray demonstrates mild cardiomegaly with pulmonary vascular congestion and CT of the abdomen and pelvis with contrast is remarkable for a 7 mm right UPJ stone with hydroureteronephrosis.  Of note, she also has some cholelithiasis, but no symptoms of cholecystitis.  She has been started on some IV fluids as well as Rocephin empirically.  Urology, Dr. Wilson SingerWren has been contacted who will come to this facility for placement of right-sided renal stent and will follow.  Review of Systems: All others reviewed and otherwise negative.  Past Medical History:  Diagnosis Date  . COPD (chronic obstructive pulmonary disease) with chronic bronchitis (HCC)   . Diabetes mellitus, type 2 (HCC)   . DJD (degenerative joint disease)   . GERD (gastroesophageal  reflux disease)   . H pylori ulcer   . Hyperlipidemia   . Hypertension   . Hypothyroidism   . Sleep apnea    Uses O2 at 3.5 liters at night  . Thyroid disease     Past Surgical History:  Procedure Laterality Date  . LAPAROSCOPIC GASTRIC BANDING    . REPLACEMENT TOTAL KNEE BILATERAL Bilateral   . TUBAL LIGATION       reports that she has been smoking cigarettes.  She has a 15.00 pack-year smoking history. She has never used smokeless tobacco. She reports that she does not drink alcohol or use drugs.  No Known Allergies  Family History  Problem Relation Age of Onset  . Multiple sclerosis Mother   . Heart disease Father   . Stroke Father   . Arthritis Father   . Diabetes Sister   . Arthritis Sister   . Diabetes Brother   . Arthritis Brother   . Diabetes Sister   . Arthritis Sister     Prior to Admission medications   Medication Sig Start Date End Date Taking? Authorizing Provider  albuterol (PROVENTIL) (2.5 MG/3ML) 0.083% nebulizer solution INHALE 1 VIAL VIA NEBULIZER EVERY 6 HOURS AS NEEDED FOR WHEEZING OR SHORTNESS OF  BREATH. 06/20/17   Jannifer RodneyHawks, Christy A, Rasmussen  aspirin 81 MG tablet Take 81 mg by mouth daily.      [provider]  budesonide-formoterol (SYMBICORT) 160-4.5 MCG/ACT inhaler USE 2 INHALATIONS TWICE A  DAY 08/30/17   Jannifer RodneyHawks, Christy A, Rasmussen  carvedilol (COREG) 12.5 MG tablet  Take 1 tablet (12.5 mg total) by mouth 2 (two) times daily with a meal. 08/30/17   Hawks, Christy A, Rasmussen  cephALEXin (KEFLEX) 500 MG capsule Take 1 capsule (500 mg total) by mouth 3 (three) times daily. 11/28/17   Jannifer Rodney A, Rasmussen  Cholecalciferol (VITAMIN D) 2000 UNITS tablet Take 2,000 Units by mouth daily.      [provider]  empagliflozin (JARDIANCE) 25 MG TABS tablet Take 25 mg by mouth daily. 08/30/17   Jennifer Spencer, Rasmussen  furosemide (LASIX) 20 MG tablet Take 1 tablet (20 mg total) by mouth daily. 08/30/17   Jennifer Spencer, Rasmussen  gabapentin (NEURONTIN) 600 MG tablet  TAKE 1 TABLET BY MOUTH 3  TIMES DAILY 11/06/17   Jannifer Rodney A, Rasmussen  glimepiride (AMARYL) 4 MG tablet TAKE 2 TABLETS BY MOUTH  DAILY WITH BREAKFAST 08/30/17   Jannifer Rodney A, Rasmussen  glucose blood (ONE TOUCH ULTRA TEST) test strip CHECK FASTING BLOOD SUGAR EVERY MORNING  E11.9 07/23/14   Ernestina Penna, MD  ipratropium-albuterol (DUONEB) 0.5-2.5 (3) MG/3ML SOLN Take 3 mLs by nebulization 4 (four) times daily as needed.      [provider]  levothyroxine (SYNTHROID, LEVOTHROID) 50 MCG tablet Take 1 tablet (50 mcg total) by mouth daily. 08/30/17   Jannifer Rodney A, Rasmussen  lisinopril (PRINIVIL,ZESTRIL) 10 MG tablet TAKE 1 TABLET BY MOUTH  DAILY 10/23/17   Jannifer Rodney A, Rasmussen  meloxicam (MOBIC) 15 MG tablet Take 1 tablet (15 mg total) by mouth daily. 08/30/17   Jennifer Spencer, Rasmussen  ranitidine (ZANTAC) 150 MG tablet Take 150 mg by mouth daily.      [provider]  simvastatin (ZOCOR) 20 MG tablet TAKE 1 TABLET BY MOUTH  DAILY AT 6PM 08/30/17   Jannifer Rodney A, Rasmussen  traMADol (ULTRAM) 50 MG tablet TAKE 2 TABLETS EVERY 8 HOURS AS NEEDED 11/28/17   Jannifer Rodney A, Rasmussen  triamcinolone cream (KENALOG) 0.1 % Apply 1 application topically 2 (two) times daily. Patient taking differently: Apply 1 application topically every other day.  11/04/14   Ernestina Penna, MD    Physical Exam: Vitals:   01/08/18 1637 01/08/18 1700 01/08/18 1715 01/08/18 1837  BP:  (!) 101/50  (!) 105/54  Pulse:  (!) 110  (!) 126  Resp:  (!) 27  (!) 31  Temp:   (!) 100.6 F (38.1 C)   TempSrc:   Oral   SpO2:  95%  96%  Weight: 114.3 kg (252 lb)     Height: 5\' 4"  (1.626 m)       Constitutional: NAD, calm, comfortable, obese Vitals:   01/08/18 1637 01/08/18 1700 01/08/18 1715 01/08/18 1837  BP:  (!) 101/50  (!) 105/54  Pulse:  (!) 110  (!) 126  Resp:  (!) 27  (!) 31  Temp:   (!) 100.6 F (38.1 C)   TempSrc:   Oral   SpO2:  95%  96%  Weight: 114.3 kg (252 lb)     Height: 5\' 4"  (1.626 m)      Eyes: lids  and conjunctivae normal ENMT: Mucous membranes are moist.  Neck: normal, supple Respiratory: clear to auscultation bilaterally. Normal respiratory effort. No accessory muscle use.  Cannula 4 L. Cardiovascular: Regular rate and rhythm, no murmurs. No extremity edema.  Currently tachycardic. Abdomen: Minimal right-sided tenderness, no distention. Bowel sounds positive.  Musculoskeletal:  No joint deformity upper and lower extremities.   Skin: no rashes, lesions, ulcers.  Mild venous stasis ulcer changes noted. Psychiatric: Normal judgment and insight. Alert and oriented x 3. Normal mood.   Labs on Admission: I have personally reviewed following labs and imaging studies  CBC: Recent Labs  Lab 01/08/18 1714  WBC 18.3*  NEUTROABS 17.2*  HGB 11.9*  HCT 37.5  MCV 77.3*  PLT 132*   Basic Metabolic Panel: Recent Labs  Lab 01/08/18 1714  NA 136  K 4.9  CL 97*  CO2 27  GLUCOSE 157*  BUN 57*  CREATININE 2.77*  CALCIUM 8.3*   GFR: Estimated Creatinine Clearance: 24.1 mL/min (A) (by C-G formula based on SCr of 2.77 mg/dL (H)). Liver Function Tests: Recent Labs  Lab 01/08/18 1714  AST 15  ALT 11*  ALKPHOS 114  BILITOT 0.9  PROT 6.8  ALBUMIN 2.5*   Recent Labs  Lab 01/08/18 1714  LIPASE 12   No results for input(s): AMMONIA in the last 168 hours. Coagulation Profile: No results for input(s): INR, PROTIME in the last 168 hours. Cardiac Enzymes: Recent Labs  Lab 01/08/18 1714  TROPONINI <0.03   BNP (last 3 results) No results for input(s): PROBNP in the last 8760 hours. HbA1C: No results for input(s): HGBA1C in the last 72 hours. CBG: No results for input(s): GLUCAP in the last 168 hours. Lipid Profile: No results for input(s): CHOL, HDL, LDLCALC, TRIG, CHOLHDL, LDLDIRECT in the last 72 hours. Thyroid Function Tests: No results for input(s): TSH, T4TOTAL, FREET4, T3FREE, THYROIDAB in the last 72 hours. Anemia Panel: No results for input(s): VITAMINB12, FOLATE,  FERRITIN, TIBC, IRON, RETICCTPCT in the last 72 hours. Urine analysis: No results found for: COLORURINE, APPEARANCEUR, LABSPEC, PHURINE, GLUCOSEU, HGBUR, BILIRUBINUR, KETONESUR, PROTEINUR, UROBILINOGEN, NITRITE, LEUKOCYTESUR  Radiological Exams on Admission: Ct Abdomen Pelvis Rasmussen Contrast  Result Date: 01/08/2018 CLINICAL DATA:  Generalized abdominal pain, nausea and vomiting with diarrhea x2 days. Low back pain. EXAM: CT ABDOMEN AND PELVIS WITH CONTRAST TECHNIQUE: Multidetector CT imaging of the abdomen and pelvis was performed using the standard protocol following bolus administration of intravenous contrast. CONTRAST:  ISOVUE-300 IOPAMIDOL (ISOVUE-300) INJECTION 61% COMPARISON:  Abdomen radiographs 03/31/2009 FINDINGS: Lower chest: Borderline cardiomegaly. No pericardial effusion. Atelectasis at the lung bases. Hepatobiliary: Small granulomata in the right and left hepatic lobes. No enhancing mass lesions. No biliary dilatation. Gallbladder is physiologically distended with layering calculi. No mural thickening, pericholecystic fluid or secondary signs of acute cholecystitis. Pancreas: No enhancing mass of the pancreas. No inflammation or ductal dilatation. Spleen: Normal size spleen without acute abnormality. Adrenals/Urinary Tract: Mild thickening of the adrenal glands. There is approximately 7 mm right ureteropelvic juncture stone causing mild-to-moderate right-sided hydroureteronephrosis. Additional nonobstructing renal pelvic, interpolar and lower pole renal calculi are identified in several coarse clusters, the largest cluster in the interpolar aspect measuring 14 x 21 x 14 mm. A cluster of 3 calculi are noted in the right renal pelvis without obstruction measuring 11 x 9 x 11 mm in aggregate. A cluster of calculi in the lower pole the right kidney measuring 14 x 12 x 9 mm is also noted. Small focus of air is identified within the interpolar intrarenal collecting system possibly from a recently  passed stone. Emphysematous pyelonephritis is believed less likely as it is contained within the renal collecting system without parenchymal involvement. Bilateral low-density lesions in both kidneys compatible with cysts are identified the largest on the left measuring 3.2 cm. Punctate left sided interpolar and lower pole renal calculi are also present. Stomach/Bowel: Gastric band device is  noted oriented along the 2:30 to 8:30 axis on the coronal reformats. No evidence of bowel obstruction or inflammation. Vascular/Lymphatic: Moderate aortoiliac atherosclerosis. No lymphadenopathy. Reproductive: Uterus and bilateral adnexa are unremarkable. Other: No free air nor free fluid. Musculoskeletal: Thoracolumbar spondylosis with marked degenerative disc disease, vacuum disc phenomenon and endplate spurring of the included lower thoracic and lumbar vertebrae. Degenerative lumbar facet arthropathy is also present. No acute nor suspicious osseous abnormality. IMPRESSION: 1. 7 mm right UPJ stone causing mild-to-moderate right-sided hydroureteronephrosis. 2. Staghorn calculi of the right kidney as above described. Small focus of air noted within right intrarenal collecting system may be secondary to a previously passed stone. Less commonly however, findings can also be seen in xanthogranulomatous pyelonephritis given the combination of these findings. 3. Nonobstructing left-sided renal calculi and bilateral renal cysts. 4. Uncomplicated cholelithiasis. 5. Hepatic granulomas. 6. Gastric band device somewhat horizontally oriented between 2:30 and 8:30 axis. This appears to be chronically situated in this orientation. 7. Thoracolumbar spondylosis. Electronically Signed   By: Tollie Eth M.D.   On: 01/08/2018 18:15   Dg Chest Port 1 View  Result Date: 01/08/2018 CLINICAL DATA:  Cough and abdominal pain EXAM: PORTABLE CHEST 1 VIEW COMPARISON:  08/09/2016 FINDINGS: There is shallow lung inflation. There is mild cardiomegaly.  Pulmonary vascular congestion without overt edema. There is no pleural effusion or pneumothorax. IMPRESSION: Mild cardiomegaly with shallow lung inflation and pulmonary vascular congestion without overt pulmonary edema. Electronically Signed   By: Deatra Robinson M.D.   On: 01/08/2018 17:28    EKG: Independently reviewed.   Assessment/Plan Principal Problem:   Sepsis (HCC) Active Problems:   Hypothyroidism   Morbid obesity (HCC)   GERD (gastroesophageal reflux disease)   Hyperlipidemia associated with type 2 diabetes mellitus (HCC)   COPD (chronic obstructive pulmonary disease) (HCC)   Renal stone    1. Sepsis secondary to UTI with nephrolithiasis and right-sided UPJ obstruction.  Urology for cystoscopy and right ureteral stent placement tonight.  Maintain on Rocephin empirically in order urine cultures which have not been obtained as of yet.  Blood cultures are pending.  Lactic acid noted to be elevated and will repeat in a.m.  Monitor in stepdown unit with aggressive IV fluid hydration. 2. AKI likely secondary to above with dehydration.  Maintain on aggressive IV fluid hydration and monitor input and output and avoid nephrotoxic agents.  Repeat renal panel in a.m. 3. COPD with OSA.  Wears 3 L O2 at baseline at night.  Duo nebs as needed.  Continue Symbicort. 4. Type 2 diabetes.  Maintain on sliding scale insulin.  Hold home medications otherwise.   5. Hypothyroidism.  Continue Synthroid. 6. Dyslipidemia.  Continue statin. 7. GERD.  PPI. 8. Hypertension.  Hold lisinopril and carvedilol due to soft blood pressure readings.   DVT prophylaxis: SCDs Code Status: Full Family Communication: Husband and stepdaughter at bedside Disposition Plan: Stent placement and decompression per urology, treatment of sepsis with antibiotics Consults called: Urology Dr. Wilson Singer Admission status: Inpatient/stepdown unit   Pratik D Sherryll Burger DO [facility] Pager [phone]  If 7PM-7AM, please contact  night-coverage [email or web address] Password [facility]  01/08/2018, 7:31 PM

## 2018-01-08 NOTE — Op Note (Signed)
Procedure: 1.  Cystoscopy with right retrograde pyelogram and interpretation. 2.  Insertion of right double-J stent.  Preop diagnosis: 7 mm right UPJ stone with sepsis.  Postoperative diagnosis: Same with pyonephrosis.  Surgeon: Dr. Bjorn PippinJohn Olanrewaju Osborn.  Anesthesia: General.  Drain: 6 French by 24 cm contour double-J stent.  Specimen: None.  EBL: None.  Complications: None.  Indications: Jennifer Rasmussen is a 67 year old white female who presented to the [facility] emergency room with a 3-day history of right flank pain and fever.  She was found on CT to have a 7 mm obstructing right UPJ stone with multiple intrarenal stones and sepsis.  It was felt that urgent placement of ureteral stent was indicated to decompress the kidney as part of her sepsis management.  Procedure: She had been given Rocephin in the emergency room.  She was taken operating room where general anesthetic was induced.  She is placed in lithotomy position and fitted with PSAs.  Her perineum and genitalia were prepped with Betadine solution and she was draped in usual sterile fashion.  Cystoscopy was performed using a 23 [place]French scope and 30 degree lens.  Examination revealed normal urethra.  The urine was quite cloudy and the bladder was rinsed to allow visualization.  The bladder wall had mild trabeculation.  No tumors or stones were noted.  The ureteral orifice ease were in their normal anatomic position.  The right ureteral orifice was cannulated with a 5 French opening catheter and contrast was instilled.  Right retrograde pyelogram demonstrated a normal ureter to the level of the proximal ureter where a filling defect was noted this flushed back into the kidney and the kidney filled and was noted to have mild hydronephrosis.  Once retrograde pyelogram was performed, guidewire was passed to the kidney through the open-ended catheter.  The open-ended catheter was removed and a 6 French 24 cm contour double-J stent was passed to the  kidney without difficulty under fluoroscopic guidance.  Wire was removed leaving a good coil in the kidney and a good coil in the bladder.  After placement of the wire and during passage of the stent there was significant efflux of toothpaste like purulent material from the right ureteral orifice.  The bladder was then drained and the cystoscope was removed.  An 5718 French Foley catheter was inserted and the balloon was filled with 10 cc of sterile fluid.  The catheter was gently irrigated to remove some of the purulent material and then the catheter was placed to straight drainage.  She was taken down from lithotomy position, her anesthetic was reversed and she was moved to recovery in stable condition.  There were no complications.

## 2018-01-08 NOTE — H&P (Signed)
Subjective: CC: Right flank pain with fever  Hx:  [facility] in consultation by Dr. Charm BargesButler.  She  is a 67 yo WF who presented to the ER with the onset Friday of right flank pain and fever.  She had no hematuria or dysuria.   She was found to be septic with an obstructing right proximal ureteral stone and multiple renal stones.   She has had no recent UTI's. She has had no other GU history.  She has morbid obesity and had a lap band several years ago and has lost 100 lbs.  She has COPD and diabetes as well.    ROS:  Review of Systems  Constitutional: Positive for chills and fever.  Respiratory: Positive for shortness of breath.   Cardiovascular: Negative for chest pain.  Gastrointestinal: Positive for diarrhea, nausea and vomiting.  Genitourinary: Positive for flank pain.  All other systems reviewed and are negative.   No Known Allergies  Past Medical History:  Diagnosis Date  . COPD (chronic obstructive pulmonary disease) with chronic bronchitis (HCC)   . Diabetes mellitus, type 2 (HCC)   . DJD (degenerative joint disease)   . GERD (gastroesophageal reflux disease)   . H pylori ulcer   . Hyperlipidemia   . Hypertension   . Hypothyroidism   . Sleep apnea    Uses O2 at 3.5 liters at night  . Thyroid disease     Past Surgical History:  Procedure Laterality Date  . LAPAROSCOPIC GASTRIC BANDING    . REPLACEMENT TOTAL KNEE BILATERAL Bilateral   . TUBAL LIGATION      Social History   Socioeconomic History  . Marital status: Married    Spouse name: Chrissie NoaWilliam  . Number of children: 0  . Years of education: 4612  . Highest education level: High school graduate  Occupational History  . Occupation: Retired    Comment: CuratorMechanic at [employer]  Social Needs  . Financial resource strain: Not hard at all  . Food insecurity:    Worry: Never true    Inability: Never true  . Transportation needs:    Medical: No    Non-medical: No  Tobacco Use  . Smoking status: Current Every  Day Smoker    Packs/day: 0.50    Years: 30.00    Pack years: 15.00    Types: Cigarettes  . Smokeless tobacco: Never Used  Substance and Sexual Activity  . Alcohol use: No  . Drug use: No  . Sexual activity: Yes    Birth control/protection: Surgical  Lifestyle  . Physical activity:    Days per week: 0 days    Minutes per session: 0 min  . Stress: Only a little  Relationships  . Social connections:    Talks on phone: More than three times a week    Gets together: More than three times a week    Attends religious service: Never    Active member of club or organization: No    Attends meetings of clubs or organizations: Never    Relationship status: Married  . Intimate partner violence:    Fear of current or ex partner: No    Emotionally abused: No    Physically abused: No    Forced sexual activity: No  Other Topics Concern  . Not on file  Social History Narrative   Married and lives at home with husband in one story home with basement. She does not have to go into the basement. Her husband works  full time but helps her out at home. He does all of the washing/drying and cooking. She has a housekeeper that comes every 2 weeks. They do not have children and she is retired from [employer] as a Curator.     Family History  Problem Relation Age of Onset  . Multiple sclerosis Mother   . Heart disease Father   . Stroke Father   . Arthritis Father   . Diabetes Sister   . Arthritis Sister   . Diabetes Brother   . Arthritis Brother   . Diabetes Sister   . Arthritis Sister     Anti-infectives: Anti-infectives (From admission, onward)   Start     Dose/Rate Route Frequency Ordered Stop   01/09/18 1800  cefTRIAXone (ROCEPHIN) 1 g in sodium chloride 0.9 % 100 mL IVPB     1 g 200 mL/hr over 30 Minutes Intravenous Every 24 hours 01/08/18 1947     01/08/18 1830  cefTRIAXone (ROCEPHIN) 1 g in sodium chloride 0.9 % 100 mL IVPB     1 g 200 mL/hr over 30 Minutes Intravenous  Once 01/08/18  1828 01/08/18 1926      Current Facility-Administered Medications  Medication Dose Route Frequency Provider Last Rate Last Dose  . acetaminophen (TYLENOL) tablet 650 mg  650 mg Oral Q6H PRN Sherryll Burger, Pratik D, DO       Or  . acetaminophen (TYLENOL) suppository 650 mg  650 mg Rectal Q6H PRN Sherryll Burger, Pratik D, DO      . [START ON 01/09/2018] cefTRIAXone (ROCEPHIN) 1 g in sodium chloride 0.9 % 100 mL IVPB  1 g Intravenous Q24H Shah, Pratik D, DO      . diatrizoate meglumine (CYSTOGRAFIN/HYPAQUE) 30 % solution    PRN Bjorn Pippin, MD   50 mL at 01/08/18 2053  . gabapentin (NEURONTIN) capsule 600 mg  600 mg Oral TID Sherryll Burger, Pratik D, DO      . insulin aspart (novoLOG) injection 0-15 Units  0-15 Units Subcutaneous Q4H Shah, Pratik D, DO      . ipratropium-albuterol (DUONEB) 0.5-2.5 (3) MG/3ML nebulizer solution 3 mL  3 mL Nebulization Q6H PRN Sherryll Burger, Pratik D, DO      . lactated ringers infusion   Intravenous Continuous Sherryll Burger, Pratik D, DO      . [START ON 01/09/2018] levothyroxine (SYNTHROID, LEVOTHROID) tablet 50 mcg  50 mcg Oral QAC breakfast Sherryll Burger, Pratik D, DO      . mometasone-formoterol (DULERA) 200-5 MCG/ACT inhaler 2 puff  2 puff Inhalation BID Sherryll Burger, Pratik D, DO      . ondansetron (ZOFRAN) tablet 4 mg  4 mg Oral Q6H PRN Sherryll Burger, Pratik D, DO       Or  . ondansetron (ZOFRAN) injection 4 mg  4 mg Intravenous Q6H PRN Sherryll Burger, Pratik D, DO      . simvastatin (ZOCOR) tablet 20 mg  20 mg Oral q1800 Shah, Pratik D, DO      . sodium chloride irrigation 0.9 %    PRN Bjorn Pippin, MD   3,000 mL at 01/08/18 2052  . triamcinolone cream (KENALOG) 0.1 % 1 application  1 application Topical BID Sherryll Burger, Pratik D, DO      . Vitamin D 2,000 Units  2,000 Units Oral Daily Sherryll Burger, Pratik D, DO       Current Outpatient Medications  Medication Sig Dispense Refill  . albuterol (PROVENTIL) (2.5 MG/3ML) 0.083% nebulizer solution INHALE 1 VIAL VIA NEBULIZER EVERY 6 HOURS AS NEEDED FOR WHEEZING OR SHORTNESS  OF  BREATH. 1080 mL 0  .  aspirin 81 MG tablet Take 81 mg by mouth daily.      . budesonide-formoterol (SYMBICORT) 160-4.5 MCG/ACT inhaler USE 2 INHALATIONS TWICE A  DAY 30.6 g 3  . carvedilol (COREG) 12.5 MG tablet Take 1 tablet (12.5 mg total) by mouth 2 (two) times daily with a meal. 180 tablet 1  . Cholecalciferol (VITAMIN D) 2000 UNITS tablet Take 2,000 Units by mouth daily.      . empagliflozin (JARDIANCE) 25 MG TABS tablet Take 25 mg by mouth daily. 90 tablet 1  . furosemide (LASIX) 20 MG tablet Take 1 tablet (20 mg total) by mouth daily. 90 tablet 1  . glimepiride (AMARYL) 4 MG tablet TAKE 2 TABLETS BY MOUTH  DAILY WITH BREAKFAST 180 tablet 3  . ipratropium-albuterol (DUONEB) 0.5-2.5 (3) MG/3ML SOLN Take 3 mLs by nebulization 4 (four) times daily as needed.      Marland Kitchen levothyroxine (SYNTHROID, LEVOTHROID) 50 MCG tablet Take 1 tablet (50 mcg total) by mouth daily. 90 tablet 2  . lisinopril (PRINIVIL,ZESTRIL) 10 MG tablet TAKE 1 TABLET BY MOUTH  DAILY 90 tablet 0  . meloxicam (MOBIC) 15 MG tablet Take 1 tablet (15 mg total) by mouth daily. 90 tablet 1  . ranitidine (ZANTAC) 150 MG tablet Take 150 mg by mouth daily.      . simvastatin (ZOCOR) 20 MG tablet TAKE 1 TABLET BY MOUTH  DAILY AT 6PM 90 tablet 1  . traMADol (ULTRAM) 50 MG tablet TAKE 2 TABLETS EVERY 8 HOURS AS NEEDED 180 tablet 3  . glucose blood (ONE TOUCH ULTRA TEST) test strip CHECK FASTING BLOOD SUGAR EVERY MORNING  E11.9 300 each 0     Objective: Vital signs in last 24 hours: Temp:  [99 F (37.2 C)-100.6 F (38.1 C)] 99 F (37.2 C) (06/24 2040) Pulse Rate:  [109-126] 109 (06/24 2040) Resp:  [20-34] 22 (06/24 2040) BP: (85-122)/(45-54) 85/45 (06/24 2040) SpO2:  [83 %-100 %] 100 % (06/24 2040) Weight:  [114.3 kg (252 lb)] 114.3 kg (252 lb) (06/24 1637)  Intake/Output from previous day: No intake/output data recorded. Intake/Output this shift: No intake/output data recorded.   Physical Exam  Constitutional: She is oriented to person, place, and  time. She appears well-developed and well-nourished. She appears ill.  obese  HENT:  Head: Normocephalic and atraumatic.  Neck: Normal range of motion. Neck supple.  Cardiovascular: Regular rhythm and normal heart sounds. Tachycardia present.  Pulmonary/Chest: Effort normal. She has rales.  Abdominal: Soft. There is tenderness. There is guarding.  Musculoskeletal: Normal range of motion. She exhibits edema.  Neurological: She is alert and oriented to person, place, and time.  Skin: Skin is warm and dry.  Venous stasis changes bilateral ankles  Vitals reviewed.   Lab Results:  Recent Labs    01/08/18 1714  WBC 18.3*  HGB 11.9*  HCT 37.5  PLT 132*   BMET Recent Labs    01/08/18 1714  NA 136  K 4.9  CL 97*  CO2 27  GLUCOSE 157*  BUN 57*  CREATININE 2.77*  CALCIUM 8.3*   PT/INR No results for input(s): LABPROT, INR in the last 72 hours. ABG No results for input(s): PHART, HCO3 in the last 72 hours.  Invalid input(s): PCO2, PO2  Studies/Results: Ct Abdomen Pelvis W Contrast  Result Date: 01/08/2018 CLINICAL DATA:  Generalized abdominal pain, nausea and vomiting with diarrhea x2 days. Low back pain. EXAM: CT ABDOMEN AND PELVIS WITH CONTRAST TECHNIQUE:  Multidetector CT imaging of the abdomen and pelvis was performed using the standard protocol following bolus administration of intravenous contrast. CONTRAST:  ISOVUE-300 IOPAMIDOL (ISOVUE-300) INJECTION 61% COMPARISON:  Abdomen radiographs 03/31/2009 FINDINGS: Lower chest: Borderline cardiomegaly. No pericardial effusion. Atelectasis at the lung bases. Hepatobiliary: Small granulomata in the right and left hepatic lobes. No enhancing mass lesions. No biliary dilatation. Gallbladder is physiologically distended with layering calculi. No mural thickening, pericholecystic fluid or secondary signs of acute cholecystitis. Pancreas: No enhancing mass of the pancreas. No inflammation or ductal dilatation. Spleen: Normal size  spleen without acute abnormality. Adrenals/Urinary Tract: Mild thickening of the adrenal glands. There is approximately 7 mm right ureteropelvic juncture stone causing mild-to-moderate right-sided hydroureteronephrosis. Additional nonobstructing renal pelvic, interpolar and lower pole renal calculi are identified in several coarse clusters, the largest cluster in the interpolar aspect measuring 14 x 21 x 14 mm. A cluster of 3 calculi are noted in the right renal pelvis without obstruction measuring 11 x 9 x 11 mm in aggregate. A cluster of calculi in the lower pole the right kidney measuring 14 x 12 x 9 mm is also noted. Small focus of air is identified within the interpolar intrarenal collecting system possibly from a recently passed stone. Emphysematous pyelonephritis is believed less likely as it is contained within the renal collecting system without parenchymal involvement. Bilateral low-density lesions in both kidneys compatible with cysts are identified the largest on the left measuring 3.2 cm. Punctate left sided interpolar and lower pole renal calculi are also present. Stomach/Bowel: Gastric band device is noted oriented along the 2:30 to 8:30 axis on the coronal reformats. No evidence of bowel obstruction or inflammation. Vascular/Lymphatic: Moderate aortoiliac atherosclerosis. No lymphadenopathy. Reproductive: Uterus and bilateral adnexa are unremarkable. Other: No free air nor free fluid. Musculoskeletal: Thoracolumbar spondylosis with marked degenerative disc disease, vacuum disc phenomenon and endplate spurring of the included lower thoracic and lumbar vertebrae. Degenerative lumbar facet arthropathy is also present. No acute nor suspicious osseous abnormality. IMPRESSION: 1. 7 mm right UPJ stone causing mild-to-moderate right-sided hydroureteronephrosis. 2. Staghorn calculi of the right kidney as above described. Small focus of air noted within right intrarenal collecting system may be secondary to a  previously passed stone. Less commonly however, findings can also be seen in xanthogranulomatous pyelonephritis given the combination of these findings. 3. Nonobstructing left-sided renal calculi and bilateral renal cysts. 4. Uncomplicated cholelithiasis. 5. Hepatic granulomas. 6. Gastric band device somewhat horizontally oriented between 2:30 and 8:30 axis. This appears to be chronically situated in this orientation. 7. Thoracolumbar spondylosis. Electronically Signed   By: Tollie Eth M.D.   On: 01/08/2018 18:15   Dg Chest Port 1 View  Result Date: 01/08/2018 CLINICAL DATA:  Cough and abdominal pain EXAM: PORTABLE CHEST 1 VIEW COMPARISON:  08/09/2016 FINDINGS: There is shallow lung inflation. There is mild cardiomegaly. Pulmonary vascular congestion without overt edema. There is no pleural effusion or pneumothorax. IMPRESSION: Mild cardiomegaly with shallow lung inflation and pulmonary vascular congestion without overt pulmonary edema. Electronically Signed   By: Deatra Robinson M.D.   On: 01/08/2018 17:28   I have discussed her case with the EDP.  I have reviewed her labs and imaging.   Assessment: Obstructing 7mm right UPJ stone with sepsis. Multiple right renal stones.   Plan: I will take her for emergent cystoscopy with right ureteral stent insertion.  I have reviewed the risks of bleeding, aggravation of the infection, ureteral injury, need for secondary procedures, thrombotic events and anesthetic complications.  CC: Dr. Meridee Score, Dr. Myna Hidalgo and Dr. Rozanna Box 01/08/2018 [phone]

## 2018-01-08 NOTE — Anesthesia Postprocedure Evaluation (Signed)
Anesthesia Post Note  Patient: Jennifer HuaJanet W Ellerman  Procedure(s) Performed: CYSTOSCOPY WITH STENT PLACEMENT (Right )  Patient location during evaluation: PACU Anesthesia Type: General Level of consciousness: sedated, awake and oriented Pain management: pain level controlled Vital Signs Assessment: post-procedure vital signs reviewed and stable Respiratory status: spontaneous breathing, nonlabored ventilation, respiratory function stable and patient connected to face mask oxygen Cardiovascular status: blood pressure returned to baseline and stable Postop Assessment: no apparent nausea or vomiting Anesthetic complications: no     Last Vitals:  Vitals:   01/08/18 2040 01/08/18 2159  BP: (!) 85/45 (!) 95/50  Pulse: (!) 109 (!) 103  Resp: (!) 22 (!) 24  Temp: 37.2 C 37 C  SpO2: 100% 99%    Last Pain:  Vitals:   01/08/18 2159  TempSrc:   PainSc: 3                  Shona NeedlesVander M Okley Magnussen

## 2018-01-08 NOTE — ED Notes (Signed)
Date and time results received: 01/08/18 6:43 PM  (use smartphrase ".now" to insert current time)  Test: Lactic Critical Value: 2.4  Name of Provider Notified: Charm BargesButler  Orders Received? Or Actions Taken?: Orders Received - See Orders for details

## 2018-01-08 NOTE — ED Provider Notes (Signed)
[facility] EMERGENCY DEPARTMENT Provider Note   CSN: [id number] Arrival date & time: 01/08/18  1602     History   Chief Complaint Chief Complaint  Patient presents with  . Abdominal Pain    HPI Jennifer Rasmussen is a 67 y.o. female.  She has multiple chronic medical problems.  She presents today with fevers nausea vomiting and abdominal pain since Friday.  States she is vomited multiple times no blood.  She had one episode of diarrhea yesterday.  She is complaining of right-sided abdominal pain worse when she coughs or pushes on that area.  She is also complaining of some lower back pain.  Her only prior abdominal surgery was a LAP-BAND.  She states the pain is severe with coughing but right now she rates her pain is 0 out of 10.  The history is provided by the patient.  Abdominal Pain   This is a new problem. The current episode started more than 2 days ago. The problem occurs constantly. The problem has not changed since onset.The pain is located in the RUQ. The pain is severe. Associated symptoms include anorexia, fever, diarrhea, nausea and vomiting. Pertinent negatives include hematochezia, melena, constipation, dysuria, frequency, hematuria and headaches. The symptoms are aggravated by coughing and palpation. The symptoms are relieved by being still.    Past Medical History:  Diagnosis Date  . COPD (chronic obstructive pulmonary disease) with chronic bronchitis (HCC)   . Diabetes mellitus, type 2 (HCC)   . DJD (degenerative joint disease)   . GERD (gastroesophageal reflux disease)   . H pylori ulcer   . Hyperlipidemia   . Hypertension   . Hypothyroidism   . Sleep apnea    Uses O2 at 3.5 liters at night  . Thyroid disease     Patient Active Problem List   Diagnosis Date Noted  . Current smoker 08/09/2016  . Insomnia 04/19/2016  . Diabetic peripheral neuropathy (HCC) 04/19/2016  . GERD (gastroesophageal reflux disease) 09/22/2014  . Vitamin D deficiency 09/22/2014  .  Hyperlipidemia associated with type 2 diabetes mellitus (HCC) 09/22/2014  . COPD exacerbation (HCC) 10/24/2012  . Hypertension associated with diabetes (HCC) 10/24/2012  . Diabetes mellitus (HCC) 10/24/2012  . Hypothyroidism 10/24/2012  . Morbid obesity (HCC) 10/24/2012  . Peripheral edema 10/24/2012  . Arthritis 04/22/2008  . LOW BACK PAIN 04/22/2008    Past Surgical History:  Procedure Laterality Date  . LAPAROSCOPIC GASTRIC BANDING    . REPLACEMENT TOTAL KNEE BILATERAL Bilateral   . TUBAL LIGATION       OB History   None      Home Medications    Prior to Admission medications   Medication Sig Start Date End Date Taking? Authorizing Provider  albuterol (PROVENTIL) (2.5 MG/3ML) 0.083% nebulizer solution INHALE 1 VIAL VIA NEBULIZER EVERY 6 HOURS AS NEEDED FOR WHEEZING OR SHORTNESS OF  BREATH. 06/20/17   Jannifer Rodney A, FNP  aspirin 81 MG tablet Take 81 mg by mouth daily.      [provider]  budesonide-formoterol (SYMBICORT) 160-4.5 MCG/ACT inhaler USE 2 INHALATIONS TWICE A  DAY 08/30/17   Jannifer Rodney A, FNP  carvedilol (COREG) 12.5 MG tablet Take 1 tablet (12.5 mg total) by mouth 2 (two) times daily with a meal. 08/30/17   Hawks, Christy A, FNP  cephALEXin (KEFLEX) 500 MG capsule Take 1 capsule (500 mg total) by mouth 3 (three) times daily. 11/28/17   Junie Spencer, FNP  Cholecalciferol (VITAMIN D) 2000 UNITS tablet Take  2,000 Units by mouth daily.      [provider]  empagliflozin (JARDIANCE) 25 MG TABS tablet Take 25 mg by mouth daily. 08/30/17   Junie Spencer, FNP  furosemide (LASIX) 20 MG tablet Take 1 tablet (20 mg total) by mouth daily. 08/30/17   Junie Spencer, FNP  gabapentin (NEURONTIN) 600 MG tablet TAKE 1 TABLET BY MOUTH 3  TIMES DAILY 11/06/17   Jannifer Rodney A, FNP  glimepiride (AMARYL) 4 MG tablet TAKE 2 TABLETS BY MOUTH  DAILY WITH BREAKFAST 08/30/17   Jannifer Rodney A, FNP  glucose blood (ONE TOUCH ULTRA TEST) test strip CHECK FASTING  BLOOD SUGAR EVERY MORNING  E11.9 07/23/14   Ernestina Penna, MD  ipratropium-albuterol (DUONEB) 0.5-2.5 (3) MG/3ML SOLN Take 3 mLs by nebulization 4 (four) times daily as needed.      [provider]  levothyroxine (SYNTHROID, LEVOTHROID) 50 MCG tablet Take 1 tablet (50 mcg total) by mouth daily. 08/30/17   Jannifer Rodney A, FNP  lisinopril (PRINIVIL,ZESTRIL) 10 MG tablet TAKE 1 TABLET BY MOUTH  DAILY 10/23/17   Jannifer Rodney A, FNP  meloxicam (MOBIC) 15 MG tablet Take 1 tablet (15 mg total) by mouth daily. 08/30/17   Junie Spencer, FNP  ranitidine (ZANTAC) 150 MG tablet Take 150 mg by mouth daily.      [provider]  simvastatin (ZOCOR) 20 MG tablet TAKE 1 TABLET BY MOUTH  DAILY AT 6PM 08/30/17   Jannifer Rodney A, FNP  traMADol (ULTRAM) 50 MG tablet TAKE 2 TABLETS EVERY 8 HOURS AS NEEDED 11/28/17   Jannifer Rodney A, FNP  triamcinolone cream (KENALOG) 0.1 % Apply 1 application topically 2 (two) times daily. Patient taking differently: Apply 1 application topically every other day.  11/04/14   Ernestina Penna, MD    Family History Family History  Problem Relation Age of Onset  . Multiple sclerosis Mother   . Heart disease Father   . Stroke Father   . Arthritis Father   . Diabetes Sister   . Arthritis Sister   . Diabetes Brother   . Arthritis Brother   . Diabetes Sister   . Arthritis Sister     Social History Social History   Tobacco Use  . Smoking status: Current Every Day Smoker    Packs/day: 0.50    Years: 30.00    Pack years: 15.00    Types: Cigarettes  . Smokeless tobacco: Never Used  Substance Use Topics  . Alcohol use: No  . Drug use: No     Allergies   Patient has no known allergies.   Review of Systems Review of Systems  Constitutional: Positive for chills and fever.  HENT: Negative for rhinorrhea and sore throat.   Eyes: Negative for pain and visual disturbance.  Respiratory: Positive for cough. Negative for shortness of breath.     Cardiovascular: Positive for leg swelling. Negative for chest pain.  Gastrointestinal: Positive for abdominal pain, anorexia, diarrhea, nausea and vomiting. Negative for constipation, hematochezia and melena.  Genitourinary: Negative for dysuria, frequency and hematuria.  Musculoskeletal: Positive for back pain. Negative for neck pain.  Skin: Negative for wound.  Neurological: Negative for syncope and headaches.     Physical Exam Updated Vital Signs BP (!) 122/52 (BP Location: Left Arm)   Pulse (!) 113   Temp 99.4 F (37.4 C) (Oral)   Resp (!) 24   Ht 5\' 4"  (1.626 m)   Wt 114.3 kg (252 lb)   LMP  10/24/2001   SpO2 (!) 83%   BMI 43.26 kg/m   Physical Exam  Constitutional: She appears well-developed and well-nourished. No distress.  HENT:  Head: Normocephalic and atraumatic.  Right Ear: External ear normal.  Left Ear: External ear normal.  Nose: Nose normal.  Mouth/Throat: Oropharynx is clear and moist.  Eyes: Pupils are equal, round, and reactive to light. Conjunctivae and EOM are normal.  Neck: Neck supple.  Cardiovascular: Regular rhythm. Tachycardia present.  No murmur heard. Pulmonary/Chest: Effort normal and breath sounds normal. No respiratory distress.  Abdominal: Soft. There is tenderness in the right upper quadrant. There is no rigidity and no guarding.  Morbidly obese.  Musculoskeletal: She exhibits edema. She exhibits no tenderness.  She has edema and stasis changes in both lower extremities.  Neurological: She is alert.  Skin: Skin is warm and dry. Capillary refill takes less than 2 seconds.  Psychiatric: She has a normal mood and affect.  Nursing note and vitals reviewed.    ED Treatments / Results  Labs (all labs ordered are listed, but only abnormal results are displayed) Labs Reviewed  CULTURE, BLOOD (ROUTINE X 2) - Abnormal; Notable for the following components:      Result Value   Culture   (*)    Value: ESCHERICHIA COLI SUSCEPTIBILITIES TO  FOLLOW Performed at [facility], [address], [place], [place] [address]    All other components within normal limits  BLOOD CULTURE ID PANEL (REFLEXED) - Abnormal; Notable for the following components:   Enterobacteriaceae species DETECTED (*)    Escherichia coli DETECTED (*)    All other components within normal limits  CBC WITH DIFFERENTIAL/PLATELET - Abnormal; Notable for the following components:   WBC 18.3 (*)    Hemoglobin 11.9 (*)    MCV 77.3 (*)    MCH 24.5 (*)    RDW 16.5 (*)    Platelets 132 (*)    Neutro Abs 17.2 (*)    All other components within normal limits  COMPREHENSIVE METABOLIC PANEL - Abnormal; Notable for the following components:   Chloride 97 (*)    Glucose, Bld 157 (*)    BUN 57 (*)    Creatinine, Ser 2.77 (*)    Calcium 8.3 (*)    Albumin 2.5 (*)    ALT 11 (*)    GFR calc non Af Amer 17 (*)    GFR calc Af Amer 19 (*)    All other components within normal limits  URINALYSIS, ROUTINE W REFLEX MICROSCOPIC - Abnormal; Notable for the following components:   APPearance CLOUDY (*)    Specific Gravity, Urine 1.037 (*)    Glucose, UA >=500 (*)    Hgb urine dipstick MODERATE (*)    Protein, ur 30 (*)    Leukocytes, UA SMALL (*)    WBC, UA >50 (*)    All other components within normal limits  LACTIC ACID, PLASMA - Abnormal; Notable for the following components:   Lactic Acid, Venous 2.4 (*)    All other components within normal limits  BASIC METABOLIC PANEL - Abnormal; Notable for the following components:   BUN 55 (*)    Creatinine, Ser 2.51 (*)    Calcium 7.5 (*)    GFR calc non Af Amer 19 (*)    GFR calc Af Amer 22 (*)    All other components within normal limits  CBC - Abnormal; Notable for the following components:   WBC 13.1 (*)    Hemoglobin  9.9 (*)    HCT 31.0 (*)    MCV 77.1 (*)    MCH 24.6 (*)    RDW 16.4 (*)    Platelets 104 (*)    All other components within normal limits  GLUCOSE, CAPILLARY - Abnormal; Notable for the  following components:   Glucose-Capillary 104 (*)    All other components within normal limits  GLUCOSE, CAPILLARY - Abnormal; Notable for the following components:   Glucose-Capillary 43 (*)    All other components within normal limits  GLUCOSE, CAPILLARY - Abnormal; Notable for the following components:   Glucose-Capillary 51 (*)    All other components within normal limits  GLUCOSE, CAPILLARY - Abnormal; Notable for the following components:   Glucose-Capillary 124 (*)    All other components within normal limits  GLUCOSE, CAPILLARY - Abnormal; Notable for the following components:   Glucose-Capillary 66 (*)    All other components within normal limits  RENAL FUNCTION PANEL - Abnormal; Notable for the following components:   BUN 55 (*)    Creatinine, Ser 2.13 (*)    Calcium 7.8 (*)    Albumin 1.9 (*)    GFR calc non Af Amer 23 (*)    GFR calc Af Amer 26 (*)    All other components within normal limits  CBC WITH DIFFERENTIAL/PLATELET - Abnormal; Notable for the following components:   Hemoglobin 9.5 (*)    HCT 30.9 (*)    MCH 24.0 (*)    RDW 16.9 (*)    Platelets 104 (*)    Neutro Abs 7.9 (*)    All other components within normal limits  GLUCOSE, CAPILLARY - Abnormal; Notable for the following components:   Glucose-Capillary 123 (*)    All other components within normal limits  CULTURE, BLOOD (ROUTINE X 2)  MRSA PCR SCREENING  URINE CULTURE  LIPASE, BLOOD  TROPONIN I  LACTIC ACID, PLASMA  HIV ANTIBODY (ROUTINE TESTING)  LACTIC ACID, PLASMA  GLUCOSE, CAPILLARY  GLUCOSE, CAPILLARY  GLUCOSE, CAPILLARY  MAGNESIUM  GLUCOSE, CAPILLARY  GLUCOSE, CAPILLARY  GLUCOSE, CAPILLARY  CBG MONITORING, ED    EKG EKG Interpretation  Date/Time:  Monday January 08 2018 16:49:48 EDT Ventricular Rate:  109 PR Interval:    QRS Duration: 91 QT Interval:  327 QTC Calculation: 441 R Axis:   -19 Text Interpretation:  Sinus tachycardia Borderline left axis deviation RSR' in V1 or V2,  right VCD or RVH similar to prior 12/18 Confirmed by Meridee ScoreButler, Dexton Zwilling [phone]([id number]) on 01/08/2018 5:05:16 PM   Radiology Ct Abdomen Pelvis W Contrast  Result Date: 01/08/2018 CLINICAL DATA:  Generalized abdominal pain, nausea and vomiting with diarrhea x2 days. Low back pain. EXAM: CT ABDOMEN AND PELVIS WITH CONTRAST TECHNIQUE: Multidetector CT imaging of the abdomen and pelvis was performed using the standard protocol following bolus administration of intravenous contrast. CONTRAST:  100mL ISOVUE-300 IOPAMIDOL (ISOVUE-300) INJECTION 61% COMPARISON:  Abdomen radiographs 03/31/2009 FINDINGS: Lower chest: Borderline cardiomegaly. No pericardial effusion. Atelectasis at the lung bases. Hepatobiliary: Small granulomata in the right and left hepatic lobes. No enhancing mass lesions. No biliary dilatation. Gallbladder is physiologically distended with layering calculi. No mural thickening, pericholecystic fluid or secondary signs of acute cholecystitis. Pancreas: No enhancing mass of the pancreas. No inflammation or ductal dilatation. Spleen: Normal size spleen without acute abnormality. Adrenals/Urinary Tract: Mild thickening of the adrenal glands. There is approximately 7 mm right ureteropelvic juncture stone causing mild-to-moderate right-sided hydroureteronephrosis. Additional nonobstructing renal pelvic, interpolar and lower pole renal calculi are  identified in several coarse clusters, the largest cluster in the interpolar aspect measuring 14 x 21 x 14 mm. A cluster of 3 calculi are noted in the right renal pelvis without obstruction measuring 11 x 9 x 11 mm in aggregate. A cluster of calculi in the lower pole the right kidney measuring 14 x 12 x 9 mm is also noted. Small focus of air is identified within the interpolar intrarenal collecting system possibly from a recently passed stone. Emphysematous pyelonephritis is believed less likely as it is contained within the renal collecting system without parenchymal  involvement. Bilateral low-density lesions in both kidneys compatible with cysts are identified the largest on the left measuring 3.2 cm. Punctate left sided interpolar and lower pole renal calculi are also present. Stomach/Bowel: Gastric band device is noted oriented along the 2:30 to 8:30 axis on the coronal reformats. No evidence of bowel obstruction or inflammation. Vascular/Lymphatic: Moderate aortoiliac atherosclerosis. No lymphadenopathy. Reproductive: Uterus and bilateral adnexa are unremarkable. Other: No free air nor free fluid. Musculoskeletal: Thoracolumbar spondylosis with marked degenerative disc disease, vacuum disc phenomenon and endplate spurring of the included lower thoracic and lumbar vertebrae. Degenerative lumbar facet arthropathy is also present. No acute nor suspicious osseous abnormality. IMPRESSION: 1. 7 mm right UPJ stone causing mild-to-moderate right-sided hydroureteronephrosis. 2. Staghorn calculi of the right kidney as above described. Small focus of air noted within right intrarenal collecting system may be secondary to a previously passed stone. Less commonly however, findings can also be seen in xanthogranulomatous pyelonephritis given the combination of these findings. 3. Nonobstructing left-sided renal calculi and bilateral renal cysts. 4. Uncomplicated cholelithiasis. 5. Hepatic granulomas. 6. Gastric band device somewhat horizontally oriented between 2:30 and 8:30 axis. This appears to be chronically situated in this orientation. 7. Thoracolumbar spondylosis. Electronically Signed   By: Tollie Eth M.D.   On: 01/08/2018 18:15   Dg Retrograde Pyelogram  Result Date: 01/08/2018 CLINICAL DATA:  Right UPJ stone EXAM: RETROGRADE PYELOGRAM COMPARISON:  01/08/2018 CT abdomen/pelvis FINDINGS: Fluoroscopy time 0 minutes 20 seconds. Multiple spot fluoroscopic intraoperative radiographs of the right renal collecting system and proximal right ureter were provided. Multiple filling  defects are noted within the right renal pelvis compatible with stones. The final radiograph demonstrates the superior tip of a right nephroureteral stent in the right renal pelvis. IMPRESSION: Multiple filling defects within the right renal pelvis compatible with stones. Superior tip of right nephroureteral stent is in the right renal pelvis on the final radiograph. Electronically Signed   By: Delbert Phenix M.D.   On: 01/08/2018 23:50   Dg Chest Port 1 View  Result Date: 01/09/2018 CLINICAL DATA:  Pulmonary edema EXAM: PORTABLE CHEST 1 VIEW COMPARISON:  01/08/2018 FINDINGS: Left suprahilar opacity, likely reflecting mild interstitial edema, new. No definite pleural effusions. No pneumothorax. Cardiomegaly. IMPRESSION: Cardiomegaly with mild left perihilar edema. Electronically Signed   By: Charline Bills M.D.   On: 01/09/2018 08:35   Dg Chest Port 1 View  Result Date: 01/08/2018 CLINICAL DATA:  Cough and abdominal pain EXAM: PORTABLE CHEST 1 VIEW COMPARISON:  08/09/2016 FINDINGS: There is shallow lung inflation. There is mild cardiomegaly. Pulmonary vascular congestion without overt edema. There is no pleural effusion or pneumothorax. IMPRESSION: Mild cardiomegaly with shallow lung inflation and pulmonary vascular congestion without overt pulmonary edema. Electronically Signed   By: Deatra Robinson M.D.   On: 01/08/2018 17:28    Procedures .Critical Care Performed by: Terrilee Files, MD Authorized by: Terrilee Files, MD   Critical  care provider statement:    Critical care time (minutes):  30   Critical care time was exclusive of:  Separately billable procedures and treating other patients   Critical care was necessary to treat or prevent imminent or life-threatening deterioration of the following conditions:  Sepsis   Critical care was time spent personally by me on the following activities:  Development of treatment plan with patient or surrogate, discussions with consultants, evaluation  of patient's response to treatment, examination of patient, obtaining history from patient or surrogate, ordering and performing treatments and interventions, ordering and review of laboratory studies, ordering and review of radiographic studies, pulse oximetry, re-evaluation of patient's condition and review of old charts   I assumed direction of critical care for this patient from another provider in my specialty: no     (including critical care time)  Medications Ordered in ED Medications  ondansetron (ZOFRAN) injection 4 mg (has no administration in time range)  morphine 4 MG/ML injection 4 mg (has no administration in time range)  sodium chloride 0.9 % bolus 1,000 mL (has no administration in time range)     Initial Impression / Assessment and Plan / ED Course  I have reviewed the triage vital signs and the nursing notes.  Pertinent labs & imaging results that were available during my care of the patient were reviewed by me and considered in my medical decision making (see chart for details).  Clinical Course as of Jan 09 1541  Mon Jan 08, 2018  1900 Gust with Dr. Wilson Singer from urology.  He feels the patient will need an emergent stent and he is asking if anesthesia would be available to do it here.  Anesthesia will be paged.  Patient is updated on results and plan.   [MB]  1916 Gust with Dr. Sherryll Burger from the hospitalist service who will evaluate the patient for admission once surgery is completed.   [MB]    Clinical Course User Index [MB] Terrilee Files, MD     Final Clinical Impressions(s) / ED Diagnoses   Final diagnoses:  Sepsis [facility])  Renal stone  Pulmonary edema  Sepsis, due to unspecified organism (HCC)  Sepsis due to Escherichia coli [facility])    ED Discharge Orders    None       Terrilee Files, MD 01/10/18 1103

## 2018-01-09 ENCOUNTER — Encounter (HOSPITAL_COMMUNITY): Payer: Self-pay | Admitting: Family Medicine

## 2018-01-09 ENCOUNTER — Inpatient Hospital Stay (HOSPITAL_COMMUNITY): Payer: Medicare Other

## 2018-01-09 DIAGNOSIS — N200 Calculus of kidney: Secondary | ICD-10-CM

## 2018-01-09 DIAGNOSIS — E039 Hypothyroidism, unspecified: Secondary | ICD-10-CM

## 2018-01-09 DIAGNOSIS — E162 Hypoglycemia, unspecified: Secondary | ICD-10-CM | POA: Diagnosis present

## 2018-01-09 DIAGNOSIS — A419 Sepsis, unspecified organism: Secondary | ICD-10-CM

## 2018-01-09 DIAGNOSIS — J449 Chronic obstructive pulmonary disease, unspecified: Secondary | ICD-10-CM

## 2018-01-09 DIAGNOSIS — K219 Gastro-esophageal reflux disease without esophagitis: Secondary | ICD-10-CM

## 2018-01-09 LAB — BASIC METABOLIC PANEL
ANION GAP: 9 (ref 5–15)
BUN: 55 mg/dL — ABNORMAL HIGH (ref 8–23)
CHLORIDE: 102 mmol/L (ref 98–111)
CO2: 25 mmol/L (ref 22–32)
CREATININE: 2.51 mg/dL — AB (ref 0.44–1.00)
Calcium: 7.5 mg/dL — ABNORMAL LOW (ref 8.9–10.3)
GFR calc non Af Amer: 19 mL/min — ABNORMAL LOW (ref >60)
GFR, EST AFRICAN AMERICAN: 22 mL/min — AB (ref >60)
Glucose, Bld: 77 mg/dL (ref 70–99)
Potassium: 4.3 mmol/L (ref 3.5–5.1)
SODIUM: 136 mmol/L (ref 135–145)

## 2018-01-09 LAB — CBC
HCT: 31 % — ABNORMAL LOW (ref 36.0–46.0)
HEMOGLOBIN: 9.9 g/dL — AB (ref 12.0–15.0)
MCH: 24.6 pg — ABNORMAL LOW (ref 26.0–34.0)
MCHC: 31.9 g/dL (ref 30.0–36.0)
MCV: 77.1 fL — ABNORMAL LOW (ref 78.0–100.0)
PLATELETS: 104 10*3/uL — AB (ref 150–400)
RBC: 4.02 MIL/uL (ref 3.87–5.11)
RDW: 16.4 % — ABNORMAL HIGH (ref 11.5–15.5)
WBC: 13.1 10*3/uL — AB (ref 4.0–10.5)

## 2018-01-09 LAB — GLUCOSE, CAPILLARY
GLUCOSE-CAPILLARY: 43 mg/dL — AB (ref 70–99)
GLUCOSE-CAPILLARY: 51 mg/dL — AB (ref 70–99)
Glucose-Capillary: 123 mg/dL — ABNORMAL HIGH (ref 70–99)
Glucose-Capillary: 124 mg/dL — ABNORMAL HIGH (ref 70–99)
Glucose-Capillary: 66 mg/dL — ABNORMAL LOW (ref 70–99)
Glucose-Capillary: 74 mg/dL (ref 70–99)
Glucose-Capillary: 99 mg/dL (ref 70–99)

## 2018-01-09 LAB — BLOOD CULTURE ID PANEL (REFLEXED)
Acinetobacter baumannii: NOT DETECTED
CARBAPENEM RESISTANCE: NOT DETECTED
Candida albicans: NOT DETECTED
Candida glabrata: NOT DETECTED
Candida krusei: NOT DETECTED
Candida parapsilosis: NOT DETECTED
Candida tropicalis: NOT DETECTED
ENTEROCOCCUS SPECIES: NOT DETECTED
Enterobacter cloacae complex: NOT DETECTED
Enterobacteriaceae species: DETECTED — AB
Escherichia coli: DETECTED — AB
Haemophilus influenzae: NOT DETECTED
Klebsiella oxytoca: NOT DETECTED
Klebsiella pneumoniae: NOT DETECTED
LISTERIA MONOCYTOGENES: NOT DETECTED
NEISSERIA MENINGITIDIS: NOT DETECTED
Proteus species: NOT DETECTED
Pseudomonas aeruginosa: NOT DETECTED
SERRATIA MARCESCENS: NOT DETECTED
STAPHYLOCOCCUS AUREUS BCID: NOT DETECTED
STAPHYLOCOCCUS SPECIES: NOT DETECTED
STREPTOCOCCUS AGALACTIAE: NOT DETECTED
STREPTOCOCCUS PNEUMONIAE: NOT DETECTED
STREPTOCOCCUS PYOGENES: NOT DETECTED
STREPTOCOCCUS SPECIES: NOT DETECTED

## 2018-01-09 LAB — MRSA PCR SCREENING: MRSA by PCR: NEGATIVE

## 2018-01-09 LAB — LACTIC ACID, PLASMA: LACTIC ACID, VENOUS: 0.8 mmol/L (ref 0.5–1.9)

## 2018-01-09 MED ORDER — INSULIN ASPART 100 UNIT/ML ~~LOC~~ SOLN: 0.0000 [IU] | Freq: Three times a day (TID) | SUBCUTANEOUS | Status: DC

## 2018-01-09 MED ORDER — DEXTROSE 10 % IV SOLN
INTRAVENOUS | Status: DC
  Administered 2018-01-09: 1000 mL via INTRAVENOUS
  Administered 2018-01-10: 22:00:00 via INTRAVENOUS

## 2018-01-09 MED ORDER — DEXTROSE-NACL 5-0.9 % IV SOLN
INTRAVENOUS | Status: DC
  Administered 2018-01-09: 11:00:00 via INTRAVENOUS

## 2018-01-09 MED ORDER — IPRATROPIUM-ALBUTEROL 0.5-2.5 (3) MG/3ML IN SOLN
3.0000 mL | Freq: Once | RESPIRATORY_TRACT | Status: AC
  Administered 2018-01-09: 3 mL via RESPIRATORY_TRACT
  Filled 2018-01-09: qty 3

## 2018-01-09 MED ORDER — SODIUM CHLORIDE 0.9 % IV SOLN
1.0000 g | Freq: Two times a day (BID) | INTRAVENOUS | Status: DC
  Administered 2018-01-09 – 2018-01-11 (×4): 1 g via INTRAVENOUS
  Filled 2018-01-09 (×6): qty 1

## 2018-01-09 NOTE — Progress Notes (Signed)
PROGRESS NOTE    Jennifer Rasmussen  ZOX:[id number]  DOB: 1951-03-30  DOA: 01/08/2018 PCP: Jennifer SpencerHawks, Christy A, FNP   Brief Admission Hx: Jennifer Rasmussen is Rasmussen 67 y.o. female with medical history significant for type 2 diabetes, morbid obesity, hypothyroidism, COPD, OSA on 3 L nasal cannula at night, hypertension, dyslipidemia, and GERD who began having some lower right-sided abdominal pain that began on Friday of last week. Chest x-ray demonstrates mild cardiomegaly with pulmonary vascular congestion and CT of the abdomen and pelvis with contrast is remarkable for Rasmussen 7 mm right UPJ stone with hydroureteronephrosis.  Of note, she also has some cholelithiasis, but no symptoms of cholecystitis.  She has been started on some IV fluids as well as Rocephin empirically.  Urology, Dr. Wilson SingerWren has been contacted who will come to this facility for placement of right-sided renal stent   MDM/Assessment & Plan:   1. Sepsis secondary to UTI with nephrolithiasis and right-sided UPJ obstruction.  Urology for cystoscopy and right ureteral stent placement tonight.  Maintain on Rocephin empirically in order urine cultures which have not been obtained as of yet.  Blood cultures NGTD.  Lactic acid trended down.   2. AKI likely secondary to above with dehydration.  slightly improved.  Maintain on aggressive IV fluid hydration and monitor input and output and avoid nephrotoxic agents.  Follow renal function panel. 3. COPD with OSA.  Wears 3 L O2 at baseline at night.  Duo nebs as needed.  Continue Symbicort. 4. Type 2 diabetes.  Maintain on sliding scale insulin.  Hold home medications otherwise.  5. Leukocytosis - WBC trending down. Following.   6. Hypothyroidism.  Continue Synthroid. 7. Dyslipidemia.  Continue statin. 8. GERD.  PPI. 9. Hypertension.  Hold lisinopril and carvedilol due to soft blood pressure readings.  DVT prophylaxis: SCDs Code Status: Full Family Communication: Husband and stepdaughter at  bedside Disposition Plan: Stent placement and decompression per urology, treatment of sepsis with antibiotics Consults called: Urology Admission status: Inpatient/stepdown unit   Consultants:  Urology  Procedures:  Right ureteral stent placement   Subjective: Pt says that her pain is much better controlled this morning.    Objective: Vitals:   01/09/18 0200 01/09/18 0300 01/09/18 0400 01/09/18 0500  BP: (!) 91/44 (!) 95/51 (!) 94/42   Pulse: (!) 102 (!) 101 97   Resp: 15 (!) 27 14   Temp:   98.2 F (36.8 C)   TempSrc:   Oral   SpO2: 96% 99% 96%   Weight:    119.5 kg (263 lb 7.2 oz)  Height:        Intake/Output Summary (Last 24 hours) at 01/09/2018 0649 Last data filed at 01/09/2018 0400 Gross per 24 hour  Intake 3091.67 ml  Output 100 ml  Net 2991.67 ml   Filed Weights   01/08/18 1637 01/09/18 0500  Weight: 114.3 kg (252 lb) 119.5 kg (263 lb 7.2 oz)     REVIEW OF SYSTEMS  As per history otherwise all reviewed and reported negative  Exam:  General exam: awake, alert, NAD. Cooperative.  Respiratory system: Clear. No increased work of breathing. Cardiovascular system: S1 & S2 heard.  Gastrointestinal system: Abdomen is nondistended, soft and nontender. Normal bowel sounds heard. Central nervous system: Alert and oriented. No focal neurological deficits. Extremities: no cyanosis.  Data Reviewed: Basic Metabolic Panel: Recent Labs  Lab 01/08/18 1714 01/09/18 0407  NA 136 136  K 4.9 4.3  CL 97* 102  CO2 27 25  GLUCOSE 157* 77  BUN 57* 55*  CREATININE 2.77* 2.51*  CALCIUM 8.3* 7.5*   Liver Function Tests: Recent Labs  Lab 01/08/18 1714  AST 15  ALT 11*  ALKPHOS 114  BILITOT 0.9  PROT 6.8  ALBUMIN 2.5*   Recent Labs  Lab 01/08/18 1714  LIPASE 12   No results for input(s): AMMONIA in the last 168 hours. CBC: Recent Labs  Lab 01/08/18 1714 01/09/18 0407  WBC 18.3* 13.1*  NEUTROABS 17.2*  --   HGB 11.9* 9.9*  HCT 37.5 31.0*  MCV  77.3* 77.1*  PLT 132* 104*   Cardiac Enzymes: Recent Labs  Lab 01/08/18 1714  TROPONINI <0.03   CBG (last 3)  Recent Labs    01/08/18 2229 01/09/18 0015 01/09/18 0415  GLUCAP 104* 99 74   Recent Results (from the past 240 hour(s))  Culture, blood (routine x 2)     Status: None (Preliminary result)   Collection Time: 01/08/18  5:15 PM  Result Value Ref Range Status   Specimen Description LEFT ANTECUBITAL  Final   Special Requests   Final    BOTTLES DRAWN AEROBIC AND ANAEROBIC Blood Culture adequate volume Performed at [facility], [address], [place], [place] [address]    Culture PENDING  Incomplete   Report Status PENDING  Incomplete  Culture, blood (routine x 2)     Status: None (Preliminary result)   Collection Time: 01/08/18  5:21 PM  Result Value Ref Range Status   Specimen Description LEFT ANTECUBITAL  Final   Special Requests   Final    BOTTLES DRAWN AEROBIC AND ANAEROBIC Blood Culture adequate volume Performed at [facility], [address], [place], [place] [address]    Culture PENDING  Incomplete   Report Status PENDING  Incomplete  MRSA PCR Screening     Status: None   Collection Time: 01/08/18 10:59 PM  Result Value Ref Range Status   MRSA by PCR NEGATIVE NEGATIVE Final    Comment:        The GeneXpert MRSA Assay (FDA approved for NASAL specimens only), is one component of Rasmussen comprehensive MRSA colonization surveillance program. It is not intended to diagnose MRSA infection nor to guide or monitor treatment for MRSA infections. Performed at [facility], [address] [place], [place] [address]      Studies: Ct Abdomen Pelvis W Contrast  Result Date: 01/08/2018 CLINICAL DATA:  Generalized abdominal pain, nausea and vomiting with diarrhea x2 days. Low back pain. EXAM: CT ABDOMEN AND PELVIS WITH CONTRAST TECHNIQUE: Multidetector CT imaging of the abdomen and pelvis was performed using the standard protocol following bolus administration of  intravenous contrast. CONTRAST:  ISOVUE-300 IOPAMIDOL (ISOVUE-300) INJECTION 61% COMPARISON:  Abdomen radiographs 03/31/2009 FINDINGS: Lower chest: Borderline cardiomegaly. No pericardial effusion. Atelectasis at the lung bases. Hepatobiliary: Small granulomata in the right and left hepatic lobes. No enhancing mass lesions. No biliary dilatation. Gallbladder is physiologically distended with layering calculi. No mural thickening, pericholecystic fluid or secondary signs of acute cholecystitis. Pancreas: No enhancing mass of the pancreas. No inflammation or ductal dilatation. Spleen: Normal size spleen without acute abnormality. Adrenals/Urinary Tract: Mild thickening of the adrenal glands. There is approximately 7 mm right ureteropelvic juncture stone causing mild-to-moderate right-sided hydroureteronephrosis. Additional nonobstructing renal pelvic, interpolar and lower pole renal calculi are identified in several coarse clusters, the largest cluster in the interpolar aspect measuring 14 x 21 x 14 mm. Rasmussen cluster of 3 calculi are noted in the right renal pelvis  without obstruction measuring 11 x 9 x 11 mm in aggregate. Rasmussen cluster of calculi in the lower pole the right kidney measuring 14 x 12 x 9 mm is also noted. Small focus of air is identified within the interpolar intrarenal collecting system possibly from Rasmussen recently passed stone. Emphysematous pyelonephritis is believed less likely as it is contained within the renal collecting system without parenchymal involvement. Bilateral low-density lesions in both kidneys compatible with cysts are identified the largest on the left measuring 3.2 cm. Punctate left sided interpolar and lower pole renal calculi are also present. Stomach/Bowel: Gastric band device is noted oriented along the 2:30 to 8:30 axis on the coronal reformats. No evidence of bowel obstruction or inflammation. Vascular/Lymphatic: Moderate aortoiliac atherosclerosis. No lymphadenopathy.  Reproductive: Uterus and bilateral adnexa are unremarkable. Other: No free air nor free fluid. Musculoskeletal: Thoracolumbar spondylosis with marked degenerative disc disease, vacuum disc phenomenon and endplate spurring of the included lower thoracic and lumbar vertebrae. Degenerative lumbar facet arthropathy is also present. No acute nor suspicious osseous abnormality. IMPRESSION: 1. 7 mm right UPJ stone causing mild-to-moderate right-sided hydroureteronephrosis. 2. Staghorn calculi of the right kidney as above described. Small focus of air noted within right intrarenal collecting system may be secondary to Rasmussen previously passed stone. Less commonly however, findings can also be seen in xanthogranulomatous pyelonephritis given the combination of these findings. 3. Nonobstructing left-sided renal calculi and bilateral renal cysts. 4. Uncomplicated cholelithiasis. 5. Hepatic granulomas. 6. Gastric band device somewhat horizontally oriented between 2:30 and 8:30 axis. This appears to be chronically situated in this orientation. 7. Thoracolumbar spondylosis. Electronically Signed   By: Tollie Eth M.D.   On: 01/08/2018 18:15   Dg Retrograde Pyelogram  Result Date: 01/08/2018 CLINICAL DATA:  Right UPJ stone EXAM: RETROGRADE PYELOGRAM COMPARISON:  01/08/2018 CT abdomen/pelvis FINDINGS: Fluoroscopy time 0 minutes 20 seconds. Multiple spot fluoroscopic intraoperative radiographs of the right renal collecting system and proximal right ureter were provided. Multiple filling defects are noted within the right renal pelvis compatible with stones. The final radiograph demonstrates the superior tip of Rasmussen right nephroureteral stent in the right renal pelvis. IMPRESSION: Multiple filling defects within the right renal pelvis compatible with stones. Superior tip of right nephroureteral stent is in the right renal pelvis on the final radiograph. Electronically Signed   By: Delbert Phenix M.D.   On: 01/08/2018 23:50   Dg Chest  Port 1 View  Result Date: 01/08/2018 CLINICAL DATA:  Cough and abdominal pain EXAM: PORTABLE CHEST 1 VIEW COMPARISON:  08/09/2016 FINDINGS: There is shallow lung inflation. There is mild cardiomegaly. Pulmonary vascular congestion without overt edema. There is no pleural effusion or pneumothorax. IMPRESSION: Mild cardiomegaly with shallow lung inflation and pulmonary vascular congestion without overt pulmonary edema. Electronically Signed   By: Deatra Robinson M.D.   On: 01/08/2018 17:28     Scheduled Meds: . cholecalciferol  2,000 Units Oral Daily  . gabapentin  600 mg Oral TID  . insulin aspart  0-15 Units Subcutaneous Q4H  . levothyroxine  50 mcg Oral QAC breakfast  . mometasone-formoterol  2 puff Inhalation BID  . simvastatin  20 mg Oral q1800  . triamcinolone cream  1 application Topical BID   Continuous Infusions: . cefTRIAXone (ROCEPHIN)  IV    . lactated ringers 125 mL/hr at 01/09/18 0227    Principal Problem:   Sepsis (HCC) Active Problems:   Hypothyroidism   Morbid obesity (HCC)   GERD (gastroesophageal reflux disease)   Hyperlipidemia associated with  type 2 diabetes mellitus (HCC)   COPD (chronic obstructive pulmonary disease) (HCC)   Renal stone   Critical Care Time spent: 34 minutes  Standley Dakins, MD, FAAFP [facility] Pager [phone]  If 7PM-7AM, please contact night-coverage [email or web address] Password [facility] 01/09/2018, 6:49 AM    LOS: 1 day

## 2018-01-09 NOTE — Progress Notes (Signed)
Anaerobic blood culture bottle positive for Gram Negative rods. Dr. Laural BenesJohnson notified.

## 2018-01-09 NOTE — Progress Notes (Signed)
PHARMACY - PHYSICIAN COMMUNICATION CRITICAL VALUE ALERT - BLOOD CULTURE IDENTIFICATION (BCID)  Jennifer Rasmussen is an 67 y.o. female who [facility] with a chief complaint of abdominal pain  Assessment:  Sepsis secondary to UTI with nephrolithiasis. Patient also has BCID which shows E.coli  Name of physician (or Provider) Contacted: Dr. Laural BenesJohnson  Current antibiotics: Ceftriaxone 1000 mg IV daily  Changes to prescribed antibiotics recommended:  Recommend changing antibiotic to meropenem 1000 mg IV every 12 hours due to [facility]  Results for orders placed or performed during the hospital encounter of 01/08/18  Blood Culture ID Panel (Reflexed) (Collected: 01/08/2018  5:15 PM)  Result Value Ref Range   Enterococcus species NOT DETECTED NOT DETECTED   Listeria monocytogenes NOT DETECTED NOT DETECTED   Staphylococcus species NOT DETECTED NOT DETECTED   Staphylococcus aureus NOT DETECTED NOT DETECTED   Streptococcus species NOT DETECTED NOT DETECTED   Streptococcus agalactiae NOT DETECTED NOT DETECTED   Streptococcus pneumoniae NOT DETECTED NOT DETECTED   Streptococcus pyogenes NOT DETECTED NOT DETECTED   Acinetobacter baumannii NOT DETECTED NOT DETECTED   Enterobacteriaceae species DETECTED (A) NOT DETECTED   Enterobacter cloacae complex NOT DETECTED NOT DETECTED   Escherichia coli DETECTED (A) NOT DETECTED   Klebsiella oxytoca NOT DETECTED NOT DETECTED   Klebsiella pneumoniae NOT DETECTED NOT DETECTED   Proteus species NOT DETECTED NOT DETECTED   Serratia marcescens NOT DETECTED NOT DETECTED   Carbapenem resistance NOT DETECTED NOT DETECTED   Haemophilus influenzae NOT DETECTED NOT DETECTED   Neisseria meningitidis NOT DETECTED NOT DETECTED   Pseudomonas aeruginosa NOT DETECTED NOT DETECTED   Candida albicans NOT DETECTED NOT DETECTED   Candida glabrata NOT DETECTED NOT DETECTED   Candida krusei NOT DETECTED NOT DETECTED   Candida parapsilosis NOT DETECTED NOT DETECTED   Candida tropicalis NOT DETECTED NOT DETECTED    Tad MooreSteven C Lelani Garnett 01/09/2018  3:24 PM

## 2018-01-09 NOTE — Progress Notes (Signed)
1 Day Post-Op Subjective: Patient reports feeling some flank discomfort  Objective: Vital signs in last 24 hours: Temp:  [98.2 F (36.8 C)-100.6 F (38.1 C)] 98.2 F (36.8 C) (06/25 0400) Pulse Rate:  [95-126] 95 (06/25 0600) Resp:  [14-34] 22 (06/25 0600) BP: (85-122)/(42-65) 94/50 (06/25 0600) SpO2:  [83 %-100 %] 95 % (06/25 0600) Weight:  [114.3 kg (252 lb)-119.5 kg (263 lb 7.2 oz)] 119.5 kg (263 lb 7.2 oz) (06/25 0500)  Intake/Output from previous day: 06/24 0701 - 06/25 0700 In: 3091.7 [I.V.:2091.7; IV Piggyback:1000] Out: 100 [Urine:100] Intake/Output this shift: No intake/output data recorded.  Physical Exam:  Constitutional: Vital signs reviewed. WD WN in NAD. Alert  Eyes: PERRL, No scleral icterus.   Cardiovascular: tachycardic Pulmonary/Chest: Normal effort   Lab Results: Recent Labs    01/08/18 1714 01/09/18 0407  HGB 11.9* 9.9*  HCT 37.5 31.0*   BMET Recent Labs    01/08/18 1714 01/09/18 0407  NA 136 136  K 4.9 4.3  CL 97* 102  CO2 27 25  GLUCOSE 157* 77  BUN 57* 55*  CREATININE 2.77* 2.51*  CALCIUM 8.3* 7.5*   No results for input(s): LABPT, INR in the last 72 hours. No results for input(s): LABURIN in the last 72 hours. Results for orders placed or performed during the hospital encounter of 01/08/18  Culture, blood (routine x 2)     Status: None (Preliminary result)   Collection Time: 01/08/18  5:15 PM  Result Value Ref Range Status   Specimen Description LEFT ANTECUBITAL  Final   Special Requests   Final    BOTTLES DRAWN AEROBIC AND ANAEROBIC Blood Culture adequate volume   Culture   Final    NO GROWTH < 24 HOURS Performed at [facility], [address] [place], [place] [address]    Report Status PENDING  Incomplete  Culture, blood (routine x 2)     Status: None (Preliminary result)   Collection Time: 01/08/18  5:21 PM  Result Value Ref Range Status   Specimen Description LEFT ANTECUBITAL  Final   Special Requests   Final     BOTTLES DRAWN AEROBIC AND ANAEROBIC Blood Culture adequate volume   Culture   Final    NO GROWTH < 24 HOURS Performed at [facility], [address], [place], [place] [address]    Report Status PENDING  Incomplete  MRSA PCR Screening     Status: None   Collection Time: 01/08/18 10:59 PM  Result Value Ref Range Status   MRSA by PCR NEGATIVE NEGATIVE Final    Comment:        The GeneXpert MRSA Assay (FDA approved for NASAL specimens only), is one component of a comprehensive MRSA colonization surveillance program. It is not intended to diagnose MRSA infection nor to guide or monitor treatment for MRSA infections. Performed at [facility], [address] [place], [place] [address]     Studies/Results: Ct Abdomen Pelvis W Contrast  Result Date: 01/08/2018 CLINICAL DATA:  Generalized abdominal pain, nausea and vomiting with diarrhea x2 days. Low back pain. EXAM: CT ABDOMEN AND PELVIS WITH CONTRAST TECHNIQUE: Multidetector CT imaging of the abdomen and pelvis was performed using the standard protocol following bolus administration of intravenous contrast. CONTRAST:  ISOVUE-300 IOPAMIDOL (ISOVUE-300) INJECTION 61% COMPARISON:  Abdomen radiographs 03/31/2009 FINDINGS: Lower chest: Borderline cardiomegaly. No pericardial effusion. Atelectasis at the lung bases. Hepatobiliary: Small granulomata in the right and left hepatic lobes. No enhancing mass lesions. No biliary dilatation. Gallbladder is  physiologically distended with layering calculi. No mural thickening, pericholecystic fluid or secondary signs of acute cholecystitis. Pancreas: No enhancing mass of the pancreas. No inflammation or ductal dilatation. Spleen: Normal size spleen without acute abnormality. Adrenals/Urinary Tract: Mild thickening of the adrenal glands. There is approximately 7 mm right ureteropelvic juncture stone causing mild-to-moderate right-sided hydroureteronephrosis. Additional nonobstructing renal pelvic,  interpolar and lower pole renal calculi are identified in several coarse clusters, the largest cluster in the interpolar aspect measuring 14 x 21 x 14 mm. A cluster of 3 calculi are noted in the right renal pelvis without obstruction measuring 11 x 9 x 11 mm in aggregate. A cluster of calculi in the lower pole the right kidney measuring 14 x 12 x 9 mm is also noted. Small focus of air is identified within the interpolar intrarenal collecting system possibly from a recently passed stone. Emphysematous pyelonephritis is believed less likely as it is contained within the renal collecting system without parenchymal involvement. Bilateral low-density lesions in both kidneys compatible with cysts are identified the largest on the left measuring 3.2 cm. Punctate left sided interpolar and lower pole renal calculi are also present. Stomach/Bowel: Gastric band device is noted oriented along the 2:30 to 8:30 axis on the coronal reformats. No evidence of bowel obstruction or inflammation. Vascular/Lymphatic: Moderate aortoiliac atherosclerosis. No lymphadenopathy. Reproductive: Uterus and bilateral adnexa are unremarkable. Other: No free air nor free fluid. Musculoskeletal: Thoracolumbar spondylosis with marked degenerative disc disease, vacuum disc phenomenon and endplate spurring of the included lower thoracic and lumbar vertebrae. Degenerative lumbar facet arthropathy is also present. No acute nor suspicious osseous abnormality. IMPRESSION: 1. 7 mm right UPJ stone causing mild-to-moderate right-sided hydroureteronephrosis. 2. Staghorn calculi of the right kidney as above described. Small focus of air noted within right intrarenal collecting system may be secondary to a previously passed stone. Less commonly however, findings can also be seen in xanthogranulomatous pyelonephritis given the combination of these findings. 3. Nonobstructing left-sided renal calculi and bilateral renal cysts. 4. Uncomplicated cholelithiasis. 5.  Hepatic granulomas. 6. Gastric band device somewhat horizontally oriented between 2:30 and 8:30 axis. This appears to be chronically situated in this orientation. 7. Thoracolumbar spondylosis. Electronically Signed   By: Tollie Ethavid  Kwon M.D.   On: 01/08/2018 18:15   Dg Retrograde Pyelogram  Result Date: 01/08/2018 CLINICAL DATA:  Right UPJ stone EXAM: RETROGRADE PYELOGRAM COMPARISON:  01/08/2018 CT abdomen/pelvis FINDINGS: Fluoroscopy time 0 minutes 20 seconds. Multiple spot fluoroscopic intraoperative radiographs of the right renal collecting system and proximal right ureter were provided. Multiple filling defects are noted within the right renal pelvis compatible with stones. The final radiograph demonstrates the superior tip of a right nephroureteral stent in the right renal pelvis. IMPRESSION: Multiple filling defects within the right renal pelvis compatible with stones. Superior tip of right nephroureteral stent is in the right renal pelvis on the final radiograph. Electronically Signed   By: Delbert PhenixJason A Poff M.D.   On: 01/08/2018 23:50   Dg Chest Port 1 View  Result Date: 01/08/2018 CLINICAL DATA:  Cough and abdominal pain EXAM: PORTABLE CHEST 1 VIEW COMPARISON:  08/09/2016 FINDINGS: There is shallow lung inflation. There is mild cardiomegaly. Pulmonary vascular congestion without overt edema. There is no pleural effusion or pneumothorax. IMPRESSION: Mild cardiomegaly with shallow lung inflation and pulmonary vascular congestion without overt pulmonary edema. Electronically Signed   By: Deatra RobinsonKevin  Herman M.D.   On: 01/08/2018 17:28    Assessment/Plan:   POD 1 stenting for infected stone. Stable @ present. Will  need eventual URS for stone once recovered--we'll eventually arrange this. Cont IV ax until C&S resulted  OK to d/c foley once up out of bed   LOS: 1 day   Chelsea Aus 01/09/2018, 8:05 AM

## 2018-01-10 ENCOUNTER — Encounter (HOSPITAL_COMMUNITY): Payer: Self-pay | Admitting: Urology

## 2018-01-10 DIAGNOSIS — A4151 Sepsis due to Escherichia coli [E. coli]: Principal | ICD-10-CM

## 2018-01-10 DIAGNOSIS — E162 Hypoglycemia, unspecified: Secondary | ICD-10-CM

## 2018-01-10 LAB — CBC WITH DIFFERENTIAL/PLATELET
Basophils Absolute: 0 10*3/uL (ref 0.0–0.1)
Basophils Relative: 0 %
Eosinophils Absolute: 0.3 10*3/uL (ref 0.0–0.7)
Eosinophils Relative: 3 %
HEMATOCRIT: 30.9 % — AB (ref 36.0–46.0)
HEMOGLOBIN: 9.5 g/dL — AB (ref 12.0–15.0)
LYMPHS ABS: 1.2 10*3/uL (ref 0.7–4.0)
Lymphocytes Relative: 12 %
MCH: 24 pg — AB (ref 26.0–34.0)
MCHC: 30.7 g/dL (ref 30.0–36.0)
MCV: 78 fL (ref 78.0–100.0)
MONO ABS: 0.9 10*3/uL (ref 0.1–1.0)
MONOS PCT: 9 %
NEUTROS PCT: 76 %
Neutro Abs: 7.9 10*3/uL — ABNORMAL HIGH (ref 1.7–7.7)
Platelets: 104 10*3/uL — ABNORMAL LOW (ref 150–400)
RBC: 3.96 MIL/uL (ref 3.87–5.11)
RDW: 16.9 % — AB (ref 11.5–15.5)
WBC: 10.2 10*3/uL (ref 4.0–10.5)

## 2018-01-10 LAB — URINE CULTURE

## 2018-01-10 LAB — RENAL FUNCTION PANEL
ANION GAP: 8 (ref 5–15)
Albumin: 1.9 g/dL — ABNORMAL LOW (ref 3.5–5.0)
BUN: 55 mg/dL — AB (ref 8–23)
CALCIUM: 7.8 mg/dL — AB (ref 8.9–10.3)
CO2: 26 mmol/L (ref 22–32)
Chloride: 103 mmol/L (ref 98–111)
Creatinine, Ser: 2.13 mg/dL — ABNORMAL HIGH (ref 0.44–1.00)
GFR calc Af Amer: 26 mL/min — ABNORMAL LOW (ref >60)
GFR, EST NON AFRICAN AMERICAN: 23 mL/min — AB (ref >60)
GLUCOSE: 72 mg/dL (ref 70–99)
Phosphorus: 3.9 mg/dL (ref 2.5–4.6)
Potassium: 4.3 mmol/L (ref 3.5–5.1)
SODIUM: 137 mmol/L (ref 135–145)

## 2018-01-10 LAB — GLUCOSE, CAPILLARY
GLUCOSE-CAPILLARY: 84 mg/dL (ref 70–99)
GLUCOSE-CAPILLARY: 85 mg/dL (ref 70–99)
GLUCOSE-CAPILLARY: 93 mg/dL (ref 70–99)
Glucose-Capillary: 77 mg/dL (ref 70–99)
Glucose-Capillary: 147 mg/dL — ABNORMAL HIGH (ref 70–99)
Glucose-Capillary: 134 mg/dL — ABNORMAL HIGH (ref 70–99)

## 2018-01-10 LAB — MAGNESIUM: Magnesium: 1.7 mg/dL (ref 1.7–2.4)

## 2018-01-10 LAB — HIV ANTIBODY (ROUTINE TESTING W REFLEX): HIV SCREEN 4TH GENERATION: NONREACTIVE

## 2018-01-10 MED ORDER — IPRATROPIUM-ALBUTEROL 0.5-2.5 (3) MG/3ML IN SOLN: 3.0000 mL | Freq: Three times a day (TID) | RESPIRATORY_TRACT | Status: DC

## 2018-01-10 MED ORDER — IPRATROPIUM-ALBUTEROL 0.5-2.5 (3) MG/3ML IN SOLN
3.0000 mL | Freq: Three times a day (TID) | RESPIRATORY_TRACT | Status: DC
  Administered 2018-01-11 – 2018-01-12 (×5): 3 mL via RESPIRATORY_TRACT
  Filled 2018-01-10 (×5): qty 3

## 2018-01-10 MED ORDER — CARVEDILOL 3.125 MG PO TABS
3.1250 mg | ORAL_TABLET | Freq: Two times a day (BID) | ORAL | Status: DC
  Administered 2018-01-10 – 2018-01-11 (×2): 3.125 mg via ORAL
  Filled 2018-01-10 (×2): qty 1

## 2018-01-10 NOTE — Care Management Important Message (Signed)
Important Message  Patient Details  Name: Jennifer Rasmussen MRN: [id number] Date of Birth: 10/04/1950   Medicare Important Message Given:  Yes    Renie OraHawkins, Solina Heron Smith 01/10/2018, 11:27 AM

## 2018-01-10 NOTE — Progress Notes (Signed)
PROGRESS NOTE  Jennifer Rasmussen [facility]  ZOX:[id number]  DOB: December 24, 1950  DOA: 01/08/2018 PCP: Junie Spencer, FNP  Brief Admission Hx: Jennifer Rasmussen is a 67 y.o. female with medical history significant for type 2 diabetes, morbid obesity, hypothyroidism, COPD, OSA on 3 L nasal cannula at night, hypertension, dyslipidemia, and GERD who began having some lower right-sided abdominal pain that began on Friday of last week. Chest x-ray demonstrates mild cardiomegaly with pulmonary vascular congestion and CT of the abdomen and pelvis with contrast is remarkable for a 7 mm right UPJ stone with hydroureteronephrosis.  Of note, she also has some cholelithiasis, but no symptoms of cholecystitis.  She has been started on some IV fluids as well as Rocephin empirically.  Urology, Dr. Wilson Singer has been contacted who will come to this facility for placement of right-sided renal stent   MDM/Assessment & Plan:   1. E coli Sepsis secondary to UTI with nephrolithiasis and right-sided UPJ obstruction.  Urology for cystoscopy and right ureteral stent placement tonight.  Maintain on Rocephin empirically in order urine cultures which have not been obtained as of yet.  Blood cultures NGTD.  Lactic acid trended down.  Ambulate with PT. DC foley when ambulating.  2. AKI likely secondary to above with dehydration.  slightly improved.  Maintain IV fluid hydration and monitor input and output and avoid nephrotoxic agents.  Follow renal function panel. 3. COPD with OSA.  Wears 3 L O2 at baseline at night.  Duo nebs as needed.  Continue Symbicort. 4. Type 2 diabetes.  Holding all insulin and antihyperglycemics while patient is hypoglycemic.  5. Leukocytosis - resolved now.  WBC trending down. Following.   6. Hypothyroidism.  Continue Synthroid. 7. Hypoglycemia - started patient on a D10 infusion, monitoring CBG closely.  Advancing to full liquid diet.   8. Dyslipidemia.  Continue statin. 9. GERD.  PPI. 10. Hypertension.  Holding  lisinopril and carvedilol due to soft blood pressure readings.  DVT prophylaxis: SCDs Code Status: Full Family Communication: Husband and stepdaughter at bedside Consults called: Urology  Consultants:  Urology  Procedures:  Right ureteral stent placement  Subjective: Pt c/o of back pain.  Not ambulated yet.  Tolerated clears.   Objective: Vitals:   01/10/18 0200 01/10/18 0300 01/10/18 0400 01/10/18 0500  BP: (!) 103/52     Pulse: 91 90    Resp: 13 13    Temp:   100.1 F (37.8 C)   TempSrc:   Oral   SpO2: 97% 98%    Weight:    122.7 kg (270 lb 8.1 oz)  Height:        Intake/Output Summary (Last 24 hours) at 01/10/2018 0654 Last data filed at 01/10/2018 0500 Gross per 24 hour  Intake 1927.59 ml  Output 1550 ml  Net 377.59 ml   Filed Weights   01/08/18 1637 01/09/18 0500 01/10/18 0500  Weight: 114.3 kg (252 lb) 119.5 kg (263 lb 7.2 oz) 122.7 kg (270 lb 8.1 oz)   REVIEW OF SYSTEMS  As per history otherwise all reviewed and reported negative  Exam:  General exam: awake, alert, NAD. Cooperative.  Respiratory system: exp wheezes heard. No increased work of breathing. Cardiovascular system: S1 & S2 heard.  Gastrointestinal system: Abdomen is nondistended, soft and nontender. Normal bowel sounds heard. Central nervous system: Alert and oriented. No focal neurological deficits. Extremities: no cyanosis.  Data Reviewed: Basic Metabolic Panel: Recent Labs  Lab 01/08/18 1714 01/09/18 0407 01/10/18 0504  NA 136 136 137  K 4.9 4.3 4.3  CL 97* 102 103  CO2 27 25 26   GLUCOSE 157* 77 72  BUN 57* 55* 55*  CREATININE 2.77* 2.51* 2.13*  CALCIUM 8.3* 7.5* 7.8*  MG  --   --  1.7  PHOS  --   --  3.9   Liver Function Tests: Recent Labs  Lab 01/08/18 1714 01/10/18 0504  AST 15  --   ALT 11*  --   ALKPHOS 114  --   BILITOT 0.9  --   PROT 6.8  --   ALBUMIN 2.5* 1.9*   Recent Labs  Lab 01/08/18 1714  LIPASE 12   No results for input(s): AMMONIA in the  last 168 hours. CBC: Recent Labs  Lab 01/08/18 1714 01/09/18 0407 01/10/18 0504  WBC 18.3* 13.1* 10.2  NEUTROABS 17.2*  --  7.9*  HGB 11.9* 9.9* 9.5*  HCT 37.5 31.0* 30.9*  MCV 77.3* 77.1* 78.0  PLT 132* 104* 104*   Cardiac Enzymes: Recent Labs  Lab 01/08/18 1714  TROPONINI <0.03   CBG (last 3)  Recent Labs    01/09/18 1945 01/09/18 2343 01/10/18 0432  GLUCAP 123* 85 77   Recent Results (from the past 240 hour(s))  Culture, blood (routine x 2)     Status: None (Preliminary result)   Collection Time: 01/08/18  5:15 PM  Result Value Ref Range Status   Specimen Description   Final    BLOOD RIGHT ANTECUBITAL Performed at [facility], [address], [place], [place] [address]    Special Requests   Final    BOTTLES DRAWN AEROBIC AND ANAEROBIC Blood Culture adequate volume Performed at [facility], [address] [place], [place] [address]    Culture  Setup Time   Final    GRAM NEGATIVE RODS AEROBIC AND ANAEROBIC BOTTLES Gram Stain Report Called to,Read Back By and Verified With: MURPHY E. AT 0832A ON [id number] BY THOMPSON S Organism ID to follow Performed at [facility], [address], [place], [place] [address]    Culture GRAM NEGATIVE RODS  Final   Report Status PENDING  Incomplete  Blood Culture ID Panel (Reflexed)     Status: Abnormal   Collection Time: 01/08/18  5:15 PM  Result Value Ref Range Status   Enterococcus species NOT DETECTED NOT DETECTED Final   Listeria monocytogenes NOT DETECTED NOT DETECTED Final   Staphylococcus species NOT DETECTED NOT DETECTED Final   Staphylococcus aureus NOT DETECTED NOT DETECTED Final   Streptococcus species NOT DETECTED NOT DETECTED Final   Streptococcus agalactiae NOT DETECTED NOT DETECTED Final   Streptococcus pneumoniae NOT DETECTED NOT DETECTED Final   Streptococcus pyogenes NOT DETECTED NOT DETECTED Final   Acinetobacter baumannii NOT DETECTED NOT DETECTED Final   Enterobacteriaceae species DETECTED  (A) NOT DETECTED Final    Comment: Enterobacteriaceae represent a large family of gram-negative bacteria, not a single organism. CRITICAL RESULT CALLED TO, READ BACK BY AND VERIFIED WITH: Ermalene Searing PharmD 15:20 01/09/18 ([email or web address])    Enterobacter cloacae complex NOT DETECTED NOT DETECTED Final   Escherichia coli DETECTED (A) NOT DETECTED Final    Comment: CRITICAL RESULT CALLED TO, READ BACK BY AND VERIFIED WITH: Ermalene Searing PharmD 15:20 01/09/18 ([email or web address])    Klebsiella oxytoca NOT DETECTED NOT DETECTED Final   Klebsiella pneumoniae NOT DETECTED NOT DETECTED Final   Proteus species NOT DETECTED NOT DETECTED Final   Serratia marcescens NOT DETECTED NOT DETECTED Final   Carbapenem resistance NOT DETECTED NOT  DETECTED Final   Haemophilus influenzae NOT DETECTED NOT DETECTED Final   Neisseria meningitidis NOT DETECTED NOT DETECTED Final   Pseudomonas aeruginosa NOT DETECTED NOT DETECTED Final   Candida albicans NOT DETECTED NOT DETECTED Final   Candida glabrata NOT DETECTED NOT DETECTED Final   Candida krusei NOT DETECTED NOT DETECTED Final   Candida parapsilosis NOT DETECTED NOT DETECTED Final   Candida tropicalis NOT DETECTED NOT DETECTED Final  Culture, blood (routine x 2)     Status: None (Preliminary result)   Collection Time: 01/08/18  5:21 PM  Result Value Ref Range Status   Specimen Description   Final    LEFT ANTECUBITAL Performed at [facility], [address], [place], [place] [address]    Special Requests   Final    BOTTLES DRAWN AEROBIC AND ANAEROBIC Blood Culture adequate volume Performed at [facility], [address], [place], [place] [address]    Culture  Setup Time   Final    GRAM NEGATIVE RODS AEROBIC AND ANAEROBIC BOTTLES Gram Stain Report Called to,Read Back By and Verified With: MURPHY E. AT 0832A ON [id number] BY THOMPSON S. Performed at [facility], [address] [place], [place] [address]    Culture GRAM NEGATIVE RODS  Final   Report Status PENDING  Incomplete    MRSA PCR Screening     Status: None   Collection Time: 01/08/18 10:59 PM  Result Value Ref Range Status   MRSA by PCR NEGATIVE NEGATIVE Final    Comment:        The GeneXpert MRSA Assay (FDA approved for NASAL specimens only), is one component of a comprehensive MRSA colonization surveillance program. It is not intended to diagnose MRSA infection nor to guide or monitor treatment for MRSA infections. Performed at [facility], [address] [place], [place] [address]      Studies: Ct Abdomen Pelvis W Contrast  Result Date: 01/08/2018 CLINICAL DATA:  Generalized abdominal pain, nausea and vomiting with diarrhea x2 days. Low back pain. EXAM: CT ABDOMEN AND PELVIS WITH CONTRAST TECHNIQUE: Multidetector CT imaging of the abdomen and pelvis was performed using the standard protocol following bolus administration of intravenous contrast. CONTRAST:  ISOVUE-300 IOPAMIDOL (ISOVUE-300) INJECTION 61% COMPARISON:  Abdomen radiographs 03/31/2009 FINDINGS: Lower chest: Borderline cardiomegaly. No pericardial effusion. Atelectasis at the lung bases. Hepatobiliary: Small granulomata in the right and left hepatic lobes. No enhancing mass lesions. No biliary dilatation. Gallbladder is physiologically distended with layering calculi. No mural thickening, pericholecystic fluid or secondary signs of acute cholecystitis. Pancreas: No enhancing mass of the pancreas. No inflammation or ductal dilatation. Spleen: Normal size spleen without acute abnormality. Adrenals/Urinary Tract: Mild thickening of the adrenal glands. There is approximately 7 mm right ureteropelvic juncture stone causing mild-to-moderate right-sided hydroureteronephrosis. Additional nonobstructing renal pelvic, interpolar and lower pole renal calculi are identified in several coarse clusters, the largest cluster in the interpolar aspect measuring 14 x 21 x 14 mm. A cluster of 3 calculi are noted in the right renal pelvis without obstruction  measuring 11 x 9 x 11 mm in aggregate. A cluster of calculi in the lower pole the right kidney measuring 14 x 12 x 9 mm is also noted. Small focus of air is identified within the interpolar intrarenal collecting system possibly from a recently passed stone. Emphysematous pyelonephritis is believed less likely as it is contained within the renal collecting system without parenchymal involvement. Bilateral low-density lesions in both kidneys compatible with cysts are identified the largest on the  left measuring 3.2 cm. Punctate left sided interpolar and lower pole renal calculi are also present. Stomach/Bowel: Gastric band device is noted oriented along the 2:30 to 8:30 axis on the coronal reformats. No evidence of bowel obstruction or inflammation. Vascular/Lymphatic: Moderate aortoiliac atherosclerosis. No lymphadenopathy. Reproductive: Uterus and bilateral adnexa are unremarkable. Other: No free air nor free fluid. Musculoskeletal: Thoracolumbar spondylosis with marked degenerative disc disease, vacuum disc phenomenon and endplate spurring of the included lower thoracic and lumbar vertebrae. Degenerative lumbar facet arthropathy is also present. No acute nor suspicious osseous abnormality. IMPRESSION: 1. 7 mm right UPJ stone causing mild-to-moderate right-sided hydroureteronephrosis. 2. Staghorn calculi of the right kidney as above described. Small focus of air noted within right intrarenal collecting system may be secondary to a previously passed stone. Less commonly however, findings can also be seen in xanthogranulomatous pyelonephritis given the combination of these findings. 3. Nonobstructing left-sided renal calculi and bilateral renal cysts. 4. Uncomplicated cholelithiasis. 5. Hepatic granulomas. 6. Gastric band device somewhat horizontally oriented between 2:30 and 8:30 axis. This appears to be chronically situated in this orientation. 7. Thoracolumbar spondylosis. Electronically Signed   By: Tollie Ethavid  Kwon  M.D.   On: 01/08/2018 18:15   Dg Retrograde Pyelogram  Result Date: 01/08/2018 CLINICAL DATA:  Right UPJ stone EXAM: RETROGRADE PYELOGRAM COMPARISON:  01/08/2018 CT abdomen/pelvis FINDINGS: Fluoroscopy time 0 minutes 20 seconds. Multiple spot fluoroscopic intraoperative radiographs of the right renal collecting system and proximal right ureter were provided. Multiple filling defects are noted within the right renal pelvis compatible with stones. The final radiograph demonstrates the superior tip of a right nephroureteral stent in the right renal pelvis. IMPRESSION: Multiple filling defects within the right renal pelvis compatible with stones. Superior tip of right nephroureteral stent is in the right renal pelvis on the final radiograph. Electronically Signed   By: Delbert PhenixJason A Poff M.D.   On: 01/08/2018 23:50   Dg Chest Port 1 View  Result Date: 01/09/2018 CLINICAL DATA:  Pulmonary edema EXAM: PORTABLE CHEST 1 VIEW COMPARISON:  01/08/2018 FINDINGS: Left suprahilar opacity, likely reflecting mild interstitial edema, new. No definite pleural effusions. No pneumothorax. Cardiomegaly. IMPRESSION: Cardiomegaly with mild left perihilar edema. Electronically Signed   By: Charline BillsSriyesh  Krishnan M.D.   On: 01/09/2018 08:35   Dg Chest Port 1 View  Result Date: 01/08/2018 CLINICAL DATA:  Cough and abdominal pain EXAM: PORTABLE CHEST 1 VIEW COMPARISON:  08/09/2016 FINDINGS: There is shallow lung inflation. There is mild cardiomegaly. Pulmonary vascular congestion without overt edema. There is no pleural effusion or pneumothorax. IMPRESSION: Mild cardiomegaly with shallow lung inflation and pulmonary vascular congestion without overt pulmonary edema. Electronically Signed   By: Deatra RobinsonKevin  Herman M.D.   On: 01/08/2018 17:28   Scheduled Meds: . cholecalciferol  2,000 Units Oral Daily  . gabapentin  600 mg Oral TID  . levothyroxine  50 mcg Oral QAC breakfast  . mometasone-formoterol  2 puff Inhalation BID  . simvastatin  20  mg Oral q1800  . triamcinolone cream  1 application Topical BID   Continuous Infusions: . dextrose 1,000 mL (01/09/18 1746)  . meropenem (MERREM) IV 1 g (01/10/18 0417)    Principal Problem:   Sepsis (HCC) Active Problems:   Renal stone   Hypoglycemia   Hypothyroidism   Morbid obesity (HCC)   GERD (gastroesophageal reflux disease)   Hyperlipidemia associated with type 2 diabetes mellitus (HCC)   COPD (chronic obstructive pulmonary disease) (HCC)  Standley Dakinslanford Maurio Baize, MD, FAAFP [facility] Pager [phone]  If 7PM-7AM,  please contact night-coverage [email or web address] Password [email or web address] 01/10/2018, 6:54 AM    LOS: 2 days

## 2018-01-10 NOTE — Evaluation (Signed)
Physical Therapy Evaluation Patient Details Name: Jennifer McJanet W Masterson MRN: [id number] DOB: [birth year] Today's Date: 01/10/2018   History of Present Illness  Jennifer Rasmussen is a 67 y.o. female with medical history significant for type 2 diabetes, morbid obesity, hypothyroidism, COPD, OSA on 3 L nasal cannula at night, hypertension, dyslipidemia, and GERD admitted 12/17/17 with onset of lower right-sided abdominal pain . Chest x-ray demonstrates mild cardiomegaly with pulmonary vascular congestion and CT of the abdomen and pelvis with contrast is remarkable for a UPJ stone with hydroureteronephrosis. noted also to have some cholelithiasis. Urology consulted. S/P for  placement of right-sided renal stent on 01/08/18.  Clinical Impression  The patient  Is very weak and deconditioned. Noted jerking of the  Arms and legs and trunk. At times unable to maintain hand grip on bed rail. Patient reports " I  Think it was the  gabapentin does this" The patient requires max assist to just sit forward in bed. Did npt attempt mobility  To bed edge as patient  Deemed not strong enough to safely  Stand/pivot today.   The patient is home alone as spouse works. The patient  Is limited in mobility PTA but was ambulating with RW. Pt admitted with above diagnosis. Pt currently with functional limitations due to the deficits listed below (see PT Problem List).  Pt will benefit from skilled PT to increase their independence and safety with mobility to allow discharge to the venue listed below.   Recommend  SNF for rehab.    Follow Up Recommendations SNF    Equipment Recommendations  None recommended by PT    Recommendations for Other Services   OT    Precautions / Restrictions Precautions Precaution Comments: on O2 3 L, watch sats      Mobility  Bed Mobility Overal bed mobility: Needs Assistance Bed Mobility: Supine to Sit     Supine to sit: Max assist     General bed mobility comments: bed placed in chair  position, patient reached to rails and  pulled self foeward with max assist   with PT using bed pad at the back to facilitate leanuing forward. Patient held for 1-2 minute intervals. Patient's  arms noted to jerk frequently with hands letting go of the rails at times, also noted the trunk and legs  jerking   Transfers                 General transfer comment: unable to safely  attempt due to jerking and body habitus  Ambulation/Gait                Stairs            Wheelchair Mobility    Modified Rankin (Stroke Patients Only)       Balance                                             Pertinent Vitals/Pain Pain Assessment: Faces Faces Pain Scale: Hurts even more Pain Location: back  Pain Descriptors / Indicators: Aching;Tightness Pain Intervention(s): Limited activity within patient's tolerance;Monitored during session;Repositioned    Home Living Family/patient expects to be discharged to:: Private residence Living Arrangements: Spouse/significant other Available Help at Discharge: Family;Available PRN/intermittently Type of Home: House Home Access: Ramped entrance     Home Layout: One level Home Equipment: Walker - 4 wheels;Walker - 2 wheels;Wheelchair - manual  Additional Comments: spouse works, patient home alone, lift chair    Prior Function Level of Independence: Needs assistance   Gait / Transfers Assistance Needed: limited distance in house and to car with 4 wheeled RW and assistance  ADL's / Homemaking Assistance Needed: sponge bathes, has HH for lymphadema wrapping/ pumps        Hand Dominance        Extremity/Trunk Assessment   Upper Extremity Assessment Upper Extremity Assessment: Generalized weakness    Lower Extremity Assessment Lower Extremity Assessment: RLE deficits/detail;LLE deficits/detail RLE Deficits / Details: noted edema  and skiln discoloration, slightly more so than left LLE Deficits / Details:  noted edema and skin discoloration    Cervical / Trunk Assessment Cervical / Trunk Assessment: Other exceptions Cervical / Trunk Exceptions: difficulty with trunk flexion  Communication   Communication: No difficulties  Cognition Arousal/Alertness: Awake/alert Behavior During Therapy: WFL for tasks assessed/performed Overall Cognitive Status: Within Functional Limits for tasks assessed                                        General Comments      Exercises General Exercises - Lower Extremity Ankle Circles/Pumps: AROM;Both;10 reps Long Arc Quad: AAROM;Both;10 reps;Seated   Assessment/Plan    PT Assessment Patient needs continued PT services  PT Problem List Decreased strength;Decreased range of motion;Decreased activity tolerance;Decreased balance;Decreased mobility;Decreased knowledge of precautions;Decreased safety awareness;Decreased knowledge of use of DME;Pain;Obesity       PT Treatment Interventions DME instruction;Gait training;Functional mobility training;Therapeutic activities;Therapeutic exercise;Patient/family education    PT Goals (Current goals can be found in the Care Plan section)  Acute Rehab PT Goals Patient Stated Goal: to get up and  move PT Goal Formulation: With patient Time For Goal Achievement: 01/24/18 Potential to Achieve Goals: Fair    Frequency Min 2X/week   Barriers to discharge Decreased caregiver support      Co-evaluation               AM-PAC PT "6 Clicks" Daily Activity  Outcome Measure Difficulty turning over in bed (including adjusting bedclothes, sheets and blankets)?: Unable Difficulty moving from lying on back to sitting on the side of the bed? : Unable Difficulty sitting down on and standing up from a chair with arms (e.g., wheelchair, bedside commode, etc,.)?: Unable Help needed moving to and from a bed to chair (including a wheelchair)?: Total Help needed walking in hospital room?: Total Help needed  climbing 3-5 steps with a railing? : Total 6 Click Score: 6    End of Session   Activity Tolerance: Patient limited by fatigue Patient left: in bed;with call bell/phone within reach;with family/visitor present Nurse Communication: Mobility status(not stron enough to stnad and pivot, jerking too much) PT Visit Diagnosis: Unsteadiness on feet (R26.81);Pain    Time: 1610-9604 PT Time Calculation (min) (ACUTE ONLY): 31 min   Charges:   PT Evaluation $PT Eval Moderate Complexity: 1 Mod PT Treatments $Therapeutic Activity: 8-22 mins   PT G CodesBlanchard Kelch PT [phone]   Rada Hay 01/10/2018, 3:55 PM

## 2018-01-10 NOTE — Evaluation (Signed)
Clinical/Bedside Swallow Evaluation Patient Details  Name: Jennifer Rasmussen MRN: [id number] Date of Birth: Apr 19, 1951  Today's Date: 01/10/2018 Time: SLP Start Time (ACUTE ONLY): 1526 SLP Stop Time (ACUTE ONLY): 1600 SLP Time Calculation (min) (ACUTE ONLY): 34 min  Past Medical History:  Past Medical History:  Diagnosis Date  . COPD (chronic obstructive pulmonary disease) with chronic bronchitis (HCC)   . Diabetes mellitus, type 2 (HCC)   . DJD (degenerative joint disease)   . GERD (gastroesophageal reflux disease)   . H pylori ulcer   . Hyperlipidemia   . Hypertension   . Hypothyroidism   . Sleep apnea    Uses O2 at 3.5 liters at night  . Thyroid disease    Past Surgical History:  Past Surgical History:  Procedure Laterality Date  . CYSTOSCOPY WITH STENT PLACEMENT Right 01/08/2018   Procedure: CYSTOSCOPY WITH STENT PLACEMENT;  Surgeon: Bjorn PippinWrenn, John, MD;  Location: [facility];  Service: Urology;  Laterality: Right;  . LAPAROSCOPIC GASTRIC BANDING    . REPLACEMENT TOTAL KNEE BILATERAL Bilateral   . TUBAL LIGATION     HPI:  Jennifer Rasmussen is a 67 y.o. female with medical history significant for type 2 diabetes, morbid obesity, hypothyroidism, COPD, OSA on 3 L nasal cannula at night, hypertension, dyslipidemia, and GERD who began having some lower right-sided abdominal pain that began on Friday of last week.  This was associated with some fever and chills as well as recurrent episodes of nausea and vomiting with no hematemesis.  She was also noted to have an episode of diarrhea yesterday.  She states that her pain is worse when she coughs or pushes on her abdomen in that region.  She appears to have no significant alleviating factors.  The pain has been worsening over the course of the weekend.   Assessment / Plan / Recommendation Clinical Impression  Clinical swallowing evaluation completed while pt was seated upright in bed. Pt consumed thin liquids, puree, pudding and regular textures  with no overt s/sx of aspiratrion noted. Note white growth on lingual surface, this was reported to nursing. Pt is edentulous and only has upper dentures; Pt requested softer foods secondary to this. Recommend initiate D2/fine chop diet and thin liquids. There are no further ST needs at this time. ST to sign off. SLP Visit Diagnosis: Dysphagia, unspecified (R13.10)    Aspiration Risk  Mild aspiration risk    Diet Recommendation Dysphagia 2 (Fine chop);Thin liquid   Liquid Administration via: Cup;Straw Medication Administration: Whole meds with liquid Supervision: Intermittent supervision to cue for compensatory strategies Compensations: Minimize environmental distractions;Slow rate;Small sips/bites Postural Changes: Seated upright at 90 degrees;Remain upright for at least 30 minutes after po intake    Other  Recommendations Oral Care Recommendations: Oral care QID   Follow up Recommendations None      Frequency and Duration            Prognosis        Swallow Study   General Date of Onset: 01/08/18 HPI: Jennifer Rasmussen is a 67 y.o. female with medical history significant for type 2 diabetes, morbid obesity, hypothyroidism, COPD, OSA on 3 L nasal cannula at night, hypertension, dyslipidemia, and GERD who began having some lower right-sided abdominal pain that began on Friday of last week.  This was associated with some fever and chills as well as recurrent episodes of nausea and vomiting with no hematemesis.  She was also noted to have an episode of diarrhea yesterday.  She  states that her pain is worse when she coughs or pushes on her abdomen in that region.  She appears to have no significant alleviating factors.  The pain has been worsening over the course of the weekend. Type of Study: Bedside Swallow Evaluation Previous Swallow Assessment: none Diet Prior to this Study: Thin liquids Temperature Spikes Noted: No Respiratory Status: Room air History of Recent Intubation:  No Behavior/Cognition: Alert;Cooperative;Pleasant mood Oral Cavity Assessment: Dry Oral Care Completed by SLP: Yes Oral Cavity - Dentition: Dentures, top;Edentulous Vision: Functional for self-feeding Self-Feeding Abilities: Able to feed self Patient Positioning: Upright in bed Baseline Vocal Quality: Normal Volitional Cough: Strong Volitional Swallow: Able to elicit    Oral/Motor/Sensory Function Overall Oral Motor/Sensory Function: Within functional limits   Ice Chips Ice chips: Within functional limits   Thin Liquid Thin Liquid: Within functional limits    Nectar Thick Nectar Thick Liquid: Not tested   Honey Thick Honey Thick Liquid: Not tested   Puree Puree: Within functional limits   Solid   Posey Jasmin H. Romie Levee, CCC-SLP Speech Language Pathologist    Solid: Within functional limits        Georgetta Haber 01/10/2018,4:05 PM

## 2018-01-11 ENCOUNTER — Inpatient Hospital Stay (HOSPITAL_COMMUNITY): Payer: Medicare Other

## 2018-01-11 LAB — CULTURE, BLOOD (ROUTINE X 2)
SPECIAL REQUESTS: ADEQUATE
Special Requests: ADEQUATE

## 2018-01-11 LAB — CBC WITH DIFFERENTIAL/PLATELET
Basophils Absolute: 0 10*3/uL (ref 0.0–0.1)
Basophils Relative: 0 %
EOS ABS: 0.2 10*3/uL (ref 0.0–0.7)
EOS PCT: 2 %
HEMATOCRIT: 30.7 % — AB (ref 36.0–46.0)
Hemoglobin: 9.4 g/dL — ABNORMAL LOW (ref 12.0–15.0)
LYMPHS ABS: 1.4 10*3/uL (ref 0.7–4.0)
Lymphocytes Relative: 15 %
MCH: 23.9 pg — AB (ref 26.0–34.0)
MCHC: 30.6 g/dL (ref 30.0–36.0)
MCV: 78.1 fL (ref 78.0–100.0)
Monocytes Absolute: 1.3 10*3/uL — ABNORMAL HIGH (ref 0.1–1.0)
Monocytes Relative: 13 %
Neutro Abs: 6.7 10*3/uL (ref 1.7–7.7)
Neutrophils Relative %: 70 %
PLATELETS: 106 10*3/uL — AB (ref 150–400)
RBC: 3.93 MIL/uL (ref 3.87–5.11)
RDW: 17.3 % — ABNORMAL HIGH (ref 11.5–15.5)
WBC: 9.6 10*3/uL (ref 4.0–10.5)

## 2018-01-11 LAB — RENAL FUNCTION PANEL
ALBUMIN: 1.8 g/dL — AB (ref 3.5–5.0)
Anion gap: 7 (ref 5–15)
BUN: 46 mg/dL — ABNORMAL HIGH (ref 8–23)
CALCIUM: 8.1 mg/dL — AB (ref 8.9–10.3)
CO2: 27 mmol/L (ref 22–32)
CREATININE: 1.6 mg/dL — AB (ref 0.44–1.00)
Chloride: 103 mmol/L (ref 98–111)
GFR, EST AFRICAN AMERICAN: 37 mL/min — AB (ref >60)
GFR, EST NON AFRICAN AMERICAN: 32 mL/min — AB (ref >60)
Glucose, Bld: 153 mg/dL — ABNORMAL HIGH (ref 70–99)
Phosphorus: 3.3 mg/dL (ref 2.5–4.6)
Potassium: 4.3 mmol/L (ref 3.5–5.1)
SODIUM: 137 mmol/L (ref 135–145)

## 2018-01-11 LAB — GLUCOSE, CAPILLARY
GLUCOSE-CAPILLARY: 137 mg/dL — AB (ref 70–99)
GLUCOSE-CAPILLARY: 171 mg/dL — AB (ref 70–99)
GLUCOSE-CAPILLARY: 151 mg/dL — AB (ref 70–99)
Glucose-Capillary: 159 mg/dL — ABNORMAL HIGH (ref 70–99)
Glucose-Capillary: 186 mg/dL — ABNORMAL HIGH (ref 70–99)

## 2018-01-11 LAB — MAGNESIUM: Magnesium: 1.8 mg/dL (ref 1.7–2.4)

## 2018-01-11 MED ORDER — SODIUM CHLORIDE 0.9 % IV SOLN
2.0000 g | INTRAVENOUS | Status: DC
  Administered 2018-01-11: 2 g via INTRAVENOUS
  Filled 2018-01-11: qty 20
  Filled 2018-01-11: qty 2
  Filled 2018-01-11 (×2): qty 20

## 2018-01-11 MED ORDER — CARVEDILOL 3.125 MG PO TABS
6.2500 mg | ORAL_TABLET | Freq: Two times a day (BID) | ORAL | Status: DC
  Administered 2018-01-11 – 2018-01-12 (×2): 6.25 mg via ORAL
  Filled 2018-01-11 (×2): qty 2

## 2018-01-11 MED ORDER — FUROSEMIDE 10 MG/ML IJ SOLN
60.0000 mg | Freq: Once | INTRAMUSCULAR | Status: AC
  Administered 2018-01-11: 60 mg via INTRAVENOUS
  Filled 2018-01-11: qty 6

## 2018-01-11 NOTE — Progress Notes (Signed)
Physical Therapy Treatment Patient Details Name: Jennifer Rasmussen MRN: [id number] DOB: 1951-04-17 Today's Date: 01/11/2018    History of Present Illness Jennifer Rasmussen is a 67 y.o. female with medical history significant for type 2 diabetes, morbid obesity, hypothyroidism, COPD, OSA on 3 L nasal cannula at night, hypertension, dyslipidemia, and GERD admitted 12/17/17 with onset of lower right-sided abdominal pain . Chest x-ray demonstrates mild cardiomegaly with pulmonary vascular congestion and CT of the abdomen and pelvis with contrast is remarkable for a UPJ stone with hydroureteronephrosis. noted also to have some cholelithiasis. Urology consulted. S/P for  placement of right-sided renal stent on 01/08/18.    PT Comments    Thje  Patient did  Tolerate sitting on the  Side of bed , requires max assist of 2. Unable to stand due to significant leg deformities/pain. Per patient " I need hip and knee replacements". Continue PT.  Follow Up Recommendations  SNF     Equipment Recommendations  None recommended by PT    Recommendations for Other Services       Precautions / Restrictions Precautions Precautions: Fall Precaution Comments: on O2 3 L, watch sats Restrictions Weight Bearing Restrictions: No    Mobility  Bed Mobility Overal bed mobility: Needs Assistance Bed Mobility: Supine to Sit;Sit to Supine     Supine to sit: Max assist;+2 for physical assistance Sit to supine: Max assist;+2 for physical assistance   General bed mobility comments: assistance for legs, and trunk, bed pad used to slide  in the bed.  Transfers                 General transfer comment: unable to complete due to weakness  Ambulation/Gait                 Stairs             Wheelchair Mobility    Modified Rankin (Stroke Patients Only)       Balance Overall balance assessment: Needs assistance Sitting-balance support: Bilateral upper extremity supported;Feet  supported Sitting balance-Leahy Scale: Poor Sitting balance - Comments: in sitting legs are externally rotated and abducted. Patient does not tolerate  repositioning Postural control: Posterior lean                                  Cognition Arousal/Alertness: Awake/alert Behavior During Therapy: WFL for tasks assessed/performed Overall Cognitive Status: Within Functional Limits for tasks assessed                                        Exercises      General Comments        Pertinent Vitals/Pain Pain Assessment: Faces Faces Pain Scale: Hurts even more Pain Location: back both legs Pain Descriptors / Indicators: Aching;Tightness Pain Intervention(s): Monitored during session;Limited activity within patient's tolerance    Home Living   Living Arrangements: Spouse/significant other Available Help at Discharge: Family;Available PRN/intermittently Type of Home: House Home Access: Ramped entrance   Home Layout: One level Home Equipment: Walker - 4 wheels;Walker - 2 wheels;Wheelchair - manual Additional Comments: spouse works, patient home alone, lift chair    Prior Function Level of Independence: Needs assistance  Gait / Transfers Assistance Needed: limited distance in house and to car with 4 wheeled RW and assistance ADL's / Homemaking Assistance Needed: sponge bathes,  has HH for lymphadema wrapping/ pumps, pt stays in housecoat mostly, rarely donns pants/socks/shoes and family assists     PT Goals (current goals can now be found in the care plan section) Progress towards PT goals: Progressing toward goals    Frequency    Min 2X/week      PT Plan Current plan remains appropriate    Co-evaluation PT/OT/SLP Co-Evaluation/Treatment: Yes Reason for Co-Treatment: Complexity of the patient's impairments (multi-system involvement) PT goals addressed during session: Mobility/safety with mobility OT goals addressed during session: ADL's and  self-care;Proper use of Adaptive equipment and DME      AM-PAC PT "6 Clicks" Daily Activity  Outcome Measure  Difficulty turning over in bed (including adjusting bedclothes, sheets and blankets)?: Unable Difficulty moving from lying on back to sitting on the side of the bed? : Unable Difficulty sitting down on and standing up from a chair with arms (e.g., wheelchair, bedside commode, etc,.)?: Unable Help needed moving to and from a bed to chair (including a wheelchair)?: Total Help needed walking in hospital room?: Total Help needed climbing 3-5 steps with a railing? : Total 6 Click Score: 6    End of Session   Activity Tolerance: Patient limited by fatigue;No increased pain Patient left: in bed;with call bell/phone within reach Nurse Communication: Mobility status;Need for lift equipment PT Visit Diagnosis: Unsteadiness on feet (R26.81);Pain Pain - part of body: Hip;Knee;Leg     Time: 0728-0750 PT Time Calculation (min) (ACUTE ONLY): 22 min  Charges:  $Therapeutic Activity: 8-22 mins                    G CodesBlanchard Kelch:       Jennifer Rasmussen PT [phone]    Rada HayHill, Jennifer Rasmussen 01/11/2018, 10:16 AM

## 2018-01-11 NOTE — Progress Notes (Signed)
PROGRESS NOTE  Jennifer Rasmussen [facility]  ZOX:[id number]  DOB: 02-10-1951  DOA: 01/08/2018 PCP: Jennifer Spencer, FNP  Brief Admission Hx: Jennifer Rasmussen is a 67 y.o. female with medical history significant for type 2 diabetes, morbid obesity, hypothyroidism, COPD, OSA on 3 L nasal cannula at night, hypertension, dyslipidemia, and GERD who began having some lower right-sided abdominal pain that began on Friday of last week. Chest x-ray demonstrates mild cardiomegaly with pulmonary vascular congestion and CT of the abdomen and pelvis with contrast is remarkable for a 7 mm right UPJ stone with hydroureteronephrosis.  Of note, she also has some cholelithiasis, but no symptoms of cholecystitis.  She has been started on some IV fluids as well as Rocephin empirically.  Urology, Dr. Wilson Rasmussen has been contacted who will come to this facility for placement of right-sided renal stent   MDM/Assessment & Plan:   1. E coli Sepsis secondary to UTI with nephrolithiasis and right-sided UPJ obstruction.  Urology for cystoscopy and right ureteral stent placement tonight.  Maintain on Rocephin empirically in order urine cultures which have not been obtained as of yet.  Blood cultures sensitivities pending for E coli.  Lactic acid trended down.  Ambulate with PT. DC foley when ambulating.  2. AKI likely secondary to above with dehydration.  slightly improved.  Maintain IV fluid hydration and monitor input and output and avoid nephrotoxic agents.  Follow renal function panel. 3. COPD with OSA.  Wears 3 L O2 at baseline at night.  Duonebs as needed.  Continue Symbicort. 4. Type 2 diabetes.  Holding all insulin and antihyperglycemics while patient is hypoglycemic.  5. Leukocytosis - resolved now.  WBC trending down. Following.   6. Hypothyroidism.  Continue Synthroid. 7. Hypoglycemia - started patient on a D10 infusion, monitoring CBG closely.  Advancing to soft diet.   8. Dyslipidemia.  Continue statin. 9. GERD.   PPI. 10. Hypertension.  Holding lisinopril and carvedilol due to soft blood pressure readings.  DVT prophylaxis: SCDs Code Status: Full Family Communication: Husband and stepdaughter at bedside Consults called: Urology Disposition: Pt needs SNF placement  Consultants:  Urology  Procedures:  Right ureteral stent placement  Subjective: Pt not getting out of bed, c/o of back pain, requiring lots of support with ambulation  Objective: Vitals:   01/11/18 0615 01/11/18 0654 01/11/18 0753 01/11/18 0802  BP: 132/64     Pulse: 99 81    Resp: 20     Temp: 98.5 F (36.9 C)     TempSrc: Oral     SpO2: (!) 89% 99% 93% 96%  Weight:      Height:        Intake/Output Summary (Last 24 hours) at 01/11/2018 0844 Last data filed at 01/11/2018 8119 Gross per 24 hour  Intake 240 ml  Output 3000 ml  Net -2760 ml   Filed Weights   01/08/18 1637 01/09/18 0500 01/10/18 0500  Weight: 114.3 kg (252 lb) 119.5 kg (263 lb 7.2 oz) 122.7 kg (270 lb 8.1 oz)   REVIEW OF SYSTEMS  As per history otherwise all reviewed and reported negative  Exam:  General exam: awake, alert, NAD. Cooperative.  Respiratory system: exp wheezes heard. No increased work of breathing. Cardiovascular system: S1 & S2 heard.  Gastrointestinal system: Abdomen is nondistended, soft and nontender. Normal bowel sounds heard. Central nervous system: Alert and oriented. No focal neurological deficits. Extremities: no cyanosis.  Data Reviewed: Basic Metabolic Panel: Recent Labs  Lab 01/08/18 1714 01/09/18 0407 01/10/18 1478  01/11/18 0422  NA 136 136 137 137  K 4.9 4.3 4.3 4.3  CL 97* 102 103 103  CO2 27 25 26 27   GLUCOSE 157* 77 72 153*  BUN 57* 55* 55* 46*  CREATININE 2.77* 2.51* 2.13* 1.60*  CALCIUM 8.3* 7.5* 7.8* 8.1*  MG  --   --  1.7 1.8  PHOS  --   --  3.9 3.3   Liver Function Tests: Recent Labs  Lab 01/08/18 1714 01/10/18 0504 01/11/18 0422  AST 15  --   --   ALT 11*  --   --   ALKPHOS 114  --    --   BILITOT 0.9  --   --   PROT 6.8  --   --   ALBUMIN 2.5* 1.9* 1.8*   Recent Labs  Lab 01/08/18 1714  LIPASE 12   No results for input(s): AMMONIA in the last 168 hours. CBC: Recent Labs  Lab 01/08/18 1714 01/09/18 0407 01/10/18 0504 01/11/18 0422  WBC 18.3* 13.1* 10.2 9.6  NEUTROABS 17.2*  --  7.9* 6.7  HGB 11.9* 9.9* 9.5* 9.4*  HCT 37.5 31.0* 30.9* 30.7*  MCV 77.3* 77.1* 78.0 78.1  PLT 132* 104* 104* 106*   Cardiac Enzymes: Recent Labs  Lab 01/08/18 1714  TROPONINI <0.03   CBG (last 3)  Recent Labs    01/10/18 2123 01/11/18 0303 01/11/18 0735  GLUCAP 134* 159* 137*   Recent Results (from the past 240 hour(s))  Culture, blood (routine x 2)     Status: Abnormal   Collection Time: 01/08/18  5:15 PM  Result Value Ref Range Status   Specimen Description   Final    BLOOD RIGHT ANTECUBITAL Performed at [facility], [address] [place], [place] [address]    Special Requests   Final    BOTTLES DRAWN AEROBIC AND ANAEROBIC Blood Culture adequate volume Performed at [facility], [address], [place], [place] [address]    Culture  Setup Time   Final    GRAM NEGATIVE RODS AEROBIC AND ANAEROBIC BOTTLES Gram Stain Report Called to,Read Back By and Verified With: MURPHY E. AT 0832A ON [id number] BY THOMPSON S    Culture ESCHERICHIA COLI (A)  Final   Report Status 01/11/2018 FINAL  Final   Organism ID, Bacteria ESCHERICHIA COLI  Final      Susceptibility   Escherichia coli - MIC*    AMPICILLIN 8 SENSITIVE Sensitive     CEFAZOLIN <=4 SENSITIVE Sensitive     CEFEPIME <=1 SENSITIVE Sensitive     CEFTAZIDIME <=1 SENSITIVE Sensitive     CEFTRIAXONE <=1 SENSITIVE Sensitive     CIPROFLOXACIN <=0.25 SENSITIVE Sensitive     GENTAMICIN <=1 SENSITIVE Sensitive     IMIPENEM <=0.25 SENSITIVE Sensitive     TRIMETH/SULFA <=20 SENSITIVE Sensitive     AMPICILLIN/SULBACTAM 4 SENSITIVE Sensitive     PIP/TAZO <=4 SENSITIVE Sensitive     Extended ESBL NEGATIVE  Sensitive     * ESCHERICHIA COLI  Blood Culture ID Panel (Reflexed)     Status: Abnormal   Collection Time: 01/08/18  5:15 PM  Result Value Ref Range Status   Enterococcus species NOT DETECTED NOT DETECTED Final   Listeria monocytogenes NOT DETECTED NOT DETECTED Final   Staphylococcus species NOT DETECTED NOT DETECTED Final   Staphylococcus aureus NOT DETECTED NOT DETECTED Final   Streptococcus species NOT DETECTED NOT DETECTED Final   Streptococcus agalactiae NOT DETECTED NOT DETECTED Final   Streptococcus  pneumoniae NOT DETECTED NOT DETECTED Final   Streptococcus pyogenes NOT DETECTED NOT DETECTED Final   Acinetobacter baumannii NOT DETECTED NOT DETECTED Final   Enterobacteriaceae species DETECTED (A) NOT DETECTED Final    Comment: Enterobacteriaceae represent a large family of gram-negative bacteria, not a single organism. CRITICAL RESULT CALLED TO, READ BACK BY AND VERIFIED WITH: Ermalene Searing PharmD 15:20 01/09/18 ([email or web address])    Enterobacter cloacae complex NOT DETECTED NOT DETECTED Final   Escherichia coli DETECTED (A) NOT DETECTED Final    Comment: CRITICAL RESULT CALLED TO, READ BACK BY AND VERIFIED WITH: Ermalene Searing PharmD 15:20 01/09/18 ([email or web address])    Klebsiella oxytoca NOT DETECTED NOT DETECTED Final   Klebsiella pneumoniae NOT DETECTED NOT DETECTED Final   Proteus species NOT DETECTED NOT DETECTED Final   Serratia marcescens NOT DETECTED NOT DETECTED Final   Carbapenem resistance NOT DETECTED NOT DETECTED Final   Haemophilus influenzae NOT DETECTED NOT DETECTED Final   Neisseria meningitidis NOT DETECTED NOT DETECTED Final   Pseudomonas aeruginosa NOT DETECTED NOT DETECTED Final   Candida albicans NOT DETECTED NOT DETECTED Final   Candida glabrata NOT DETECTED NOT DETECTED Final   Candida krusei NOT DETECTED NOT DETECTED Final   Candida parapsilosis NOT DETECTED NOT DETECTED Final   Candida tropicalis NOT DETECTED NOT DETECTED Final  Culture, blood (routine x 2)     Status:  Abnormal   Collection Time: 01/08/18  5:21 PM  Result Value Ref Range Status   Specimen Description   Final    LEFT ANTECUBITAL Performed at [facility], [address], [place], [place] [address]    Special Requests   Final    BOTTLES DRAWN AEROBIC AND ANAEROBIC Blood Culture adequate volume Performed at [facility], [address], [place], [place] [address]    Culture  Setup Time   Final    GRAM NEGATIVE RODS AEROBIC AND ANAEROBIC BOTTLES Gram Stain Report Called to,Read Back By and Verified With: MURPHY E. AT 0832A ON [id number] BY THOMPSON S. Performed at [facility], [address], [place], [place] [address]    Culture (A)  Final    ESCHERICHIA COLI SUSCEPTIBILITIES PERFORMED ON PREVIOUS CULTURE WITHIN THE LAST 5 DAYS. Performed at [facility], [address], [place], [place] [address]    Report Status 01/11/2018 FINAL  Final  Urine culture     Status: Abnormal   Collection Time: 01/08/18  7:00 PM  Result Value Ref Range Status   Specimen Description   Final    URINE, CATHETERIZED Performed at [facility], [address] [place], [place] [address]    Special Requests   Final    NONE Performed at [facility], [address], [place], [place] [address]    Culture (A)  Final    <10,000 COLONIES/mL INSIGNIFICANT GROWTH Performed at [facility], [address], [place], [place] [address]    Report Status 01/10/2018 FINAL  Final  MRSA PCR Screening     Status: None   Collection Time: 01/08/18 10:59 PM  Result Value Ref Range Status   MRSA by PCR NEGATIVE NEGATIVE Final    Comment:        The GeneXpert MRSA Assay (FDA approved for NASAL specimens only), is one component of a comprehensive MRSA colonization surveillance program. It is not intended to diagnose MRSA infection nor to guide or monitor treatment for MRSA infections. Performed at [facility], [address], [place], [place] [address]      Studies: No  results found. Scheduled Meds: .  carvedilol  3.125 mg Oral BID WC  . cholecalciferol  2,000 Units Oral Daily  . ipratropium-albuterol  3 mL Nebulization TID  . levothyroxine  50 mcg Oral QAC breakfast  . mometasone-formoterol  2 puff Inhalation BID  . simvastatin  20 mg Oral q1800  . triamcinolone cream  1 application Topical BID   Continuous Infusions: . dextrose 50 mL/hr at 01/11/18 0651  . meropenem (MERREM) IV Stopped (01/11/18 0630)    Principal Problem:   Sepsis (HCC) Active Problems:   Renal stone   Hypoglycemia   Hypothyroidism   Morbid obesity (HCC)   GERD (gastroesophageal reflux disease)   Hyperlipidemia associated with type 2 diabetes mellitus (HCC)   COPD (chronic obstructive pulmonary disease) (HCC)  Standley Dakinslanford Wayne Wicklund, MD, FAAFP [facility] Pager [phone]  If 7PM-7AM, please contact night-coverage [email or web address] Password [email or web address] 01/11/2018, 8:44 AM    LOS: 3 days

## 2018-01-11 NOTE — [place] FL2 (Signed)
[place] MEDICAID FL2 LEVEL OF CARE SCREENING TOOL     IDENTIFICATION  Patient Name: Jennifer Rasmussen Birthdate: May 30, 1951 Sex: female Admission Date (Current Location): 01/08/2018  [facility] and [place] Number:  [employer] and Address:  [facility],  [address] [place] [address]      Provider Number: [id number]  Attending Physician Name and Address:  Cleora Fleet, MD  Relative Name and Phone Number:       Current Level of Care: Hospital Recommended Level of Care: Skilled Nursing Facility Prior Approval Number:    Date Approved/Denied:   PASRR Number: [id number])  Discharge Plan: SNF    Current Diagnoses: Patient Active Problem List   Diagnosis Date Noted  . Hypoglycemia 01/09/2018  . COPD (chronic obstructive pulmonary disease) (HCC) 01/08/2018  . Renal stone 01/08/2018  . Sepsis (HCC) 01/08/2018  . Current smoker 08/09/2016  . Insomnia 04/19/2016  . Diabetic peripheral neuropathy (HCC) 04/19/2016  . GERD (gastroesophageal reflux disease) 09/22/2014  . Vitamin D deficiency 09/22/2014  . Hyperlipidemia associated with type 2 diabetes mellitus (HCC) 09/22/2014  . COPD exacerbation (HCC) 10/24/2012  . Hypertension associated with diabetes (HCC) 10/24/2012  . Diabetes mellitus (HCC) 10/24/2012  . Hypothyroidism 10/24/2012  . Morbid obesity (HCC) 10/24/2012  . Peripheral edema 10/24/2012  . Arthritis 04/22/2008  . LOW BACK PAIN 04/22/2008    Orientation RESPIRATION BLADDER Height & Weight     Self, Time, Situation, Place  O2(4L. ) Continent Weight: 270 lb 8.1 oz (122.7 kg) Height:  5\' 4"  (162.6 cm)  BEHAVIORAL SYMPTOMS/MOOD NEUROLOGICAL BOWEL NUTRITION STATUS      Continent Diet(DYS 2)  AMBULATORY STATUS COMMUNICATION OF NEEDS Skin   Extensive Assist   Normal                       Personal Care Assistance Level of Assistance  Bathing, Feeding, Dressing Bathing Assistance: Maximum assistance Feeding  assistance: Limited assistance Dressing Assistance: Maximum assistance     Functional Limitations Info  Sight, Hearing, Speech Sight Info: Adequate Hearing Info: Adequate Speech Info: Adequate    SPECIAL CARE FACTORS FREQUENCY  PT (By licensed PT), OT (By licensed OT), Speech therapy     PT Frequency: 5x/week OT Frequency: 3x/week     Speech Therapy Frequency: 3x/week      Contractures Contractures Info: Not present    Additional Factors Info  Code Status, Allergies Code Status Info: Full code Allergies Info: NKA           Current Medications (01/11/2018):  This is the current hospital active medication list Current Facility-Administered Medications  Medication Dose Route Frequency Provider Last Rate Last Dose  . acetaminophen (TYLENOL) tablet 650 mg  650 mg Oral Q6H PRN Sherryll Burger, Pratik D, DO       Or  . acetaminophen (TYLENOL) suppository 650 mg  650 mg Rectal Q6H PRN Sherryll Burger, Pratik D, DO      . carvedilol (COREG) tablet 6.25 mg  6.25 mg Oral BID WC Johnson, Clanford L, MD      . cefTRIAXone (ROCEPHIN) 2 g in sodium chloride 0.9 % 100 mL IVPB  2 g Intravenous Q24H Johnson, Clanford L, MD      . cholecalciferol (VITAMIN D) tablet 2,000 Units  2,000 Units Oral Daily Sherryll Burger, Pratik D, DO   2,000 Units at 01/11/18 0818  . ipratropium-albuterol (DUONEB) 0.5-2.5 (3) MG/3ML nebulizer solution 3 mL  3 mL Nebulization Q6H PRN Sherryll Burger, Pratik D, DO  3 mL at 01/10/18 1807  . ipratropium-albuterol (DUONEB) 0.5-2.5 (3) MG/3ML nebulizer solution 3 mL  3 mL Nebulization TID Laural BenesJohnson, Clanford L, MD   3 mL at 01/11/18 0751  . levothyroxine (SYNTHROID, LEVOTHROID) tablet 50 mcg  50 mcg Oral QAC breakfast Maurilio LovelyShah, Pratik D, DO   50 mcg at 01/11/18 0818  . mometasone-formoterol (DULERA) 200-5 MCG/ACT inhaler 2 puff  2 puff Inhalation BID Sherryll BurgerShah, Pratik D, DO   2 puff at 01/11/18 0800  . ondansetron (ZOFRAN) tablet 4 mg  4 mg Oral Q6H PRN Sherryll BurgerShah, Pratik D, DO   4 mg at 01/09/18 2145   Or  . ondansetron  (ZOFRAN) injection 4 mg  4 mg Intravenous Q6H PRN Sherryll BurgerShah, Pratik D, DO      . oxyCODONE-acetaminophen (PERCOCET/ROXICET) 5-325 MG per tablet 1-2 tablet  1-2 tablet Oral Q4H PRN Schorr, Roma KayserKatherine P, NP   2 tablet at 01/11/18 [phone]  . simvastatin (ZOCOR) tablet 20 mg  20 mg Oral q1800 Sherryll BurgerShah, Pratik D, DO   20 mg at 01/10/18 1745  . triamcinolone cream (KENALOG) 0.1 % 1 application  1 application Topical BID Maurilio LovelyShah, Pratik D, DO   1 application at 01/11/18 [id number]     Discharge Medications: Please see discharge summary for a list of discharge medications.  Relevant Imaging Results:  Relevant Lab Results:   Additional Information SSN [id number]   Mava Suares, Juleen ChinaHeather D, LCSW

## 2018-01-11 NOTE — Clinical Social Work Note (Signed)
Clinical Social Work Assessment  Patient Details  Name: Jennifer Rasmussen MRN: [id number] Date of Birth: 1951-06-03  Date of referral:  01/11/18               Reason for consult:  Discharge Planning, Facility Placement                Permission sought to share information with:  Chartered certified accountant granted to share information::  Yes, Verbal Permission Granted  Name::        Agency::  [facility], [facility], [place]  Relationship::     Contact Information:     Housing/Transportation Living arrangements for the past 2 months:  [place] of Information:  Patient Patient Interpreter Needed:  None Criminal Activity/Legal Involvement Pertinent to Current Situation/Hospitalization:  No - Comment as needed Significant Relationships:  Spouse Lives with:  Spouse Do you feel safe going back to the place where you live?  Yes Need for family participation in patient care:  Yes (Comment)  Care giving concerns: PT recommending SNF short term rehab.   Social Worker assessment / plan: Pt is a 67 year old female referred to CSW for SNF rehab placement. Met with pt this AM to assess. Pt lives with her husband in their home in [place]. Pt is on O2 at home. She uses a walker at baseline. Pt's husband assists with ADLs. Pt does not drive. Discussed SNF options with pt who requests referrals to [facility], [place]. Referrals will be started and CSW will follow.  Employment status:  Retired Forensic scientist:    PT Recommendations:  [place] / Referral to community resources:     Patient/Family's Response to care: Pt accepting of care.  Patient/Family's Understanding of and Emotional Response to Diagnosis, Current Treatment, and Prognosis: Pt appears to have a good understanding of diagnosis and treatment recommendations. No emotional distress identified.  Emotional Assessment Appearance:  Appears stated  age Attitude/Demeanor/Rapport:    Affect (typically observed):  Flat Orientation:  Oriented to Self, Oriented to Place, Oriented to  Time, Oriented to Situation Alcohol / Substance use:  Not Applicable Psych involvement (Current and /or in the community):  No (Comment)  Discharge Needs  Concerns to be addressed:  Discharge Planning Concerns Readmission within the last 30 days:  No Current discharge risk:  Physical Impairment, Dependent with Mobility Barriers to Discharge:  No Barriers Identified   Shade Flood, LCSW 01/11/2018, 12:10 PM

## 2018-01-11 NOTE — Evaluation (Signed)
Occupational Therapy Evaluation Patient Details Name: Jennifer Rasmussen MRN: [id number] DOB: 03-18-1951 Today's Date: 01/11/2018    History of Present Illness Jennifer McJanet W Wester is a 67 y.o. female with medical history significant for type 2 diabetes, morbid obesity, hypothyroidism, COPD, OSA on 3 L nasal cannula at night, hypertension, dyslipidemia, and GERD admitted 12/17/17 with onset of lower right-sided abdominal pain . Chest x-ray demonstrates mild cardiomegaly with pulmonary vascular congestion and CT of the abdomen and pelvis with contrast is remarkable for a UPJ stone with hydroureteronephrosis. noted also to have some cholelithiasis. Urology consulted. S/P for  placement of right-sided renal stent on 01/08/18.   Clinical Impression   Pt seen with PT today, agreeable to OT/PT co-treatment/evaluation. Pt requiring heavy +2 assistance for mobility tasks due to weakness and poor activity tolerance. Pt is requiring mod to total assist with ADL, is unable to complete ADLs in standing, is limited in sitting due to pain and weakness. Recommend SNF on discharge to improve strength, activity tolerance, safety and independence in ADL tasks.     Follow Up Recommendations  SNF    Equipment Recommendations  None recommended by OT       Precautions / Restrictions Precautions Precautions: Fall Restrictions Weight Bearing Restrictions: No      Mobility Bed Mobility Overal bed mobility: Needs Assistance Bed Mobility: Supine to Sit;Sit to Supine     Supine to sit: Max assist;+2 for physical assistance Sit to supine: Max assist;+2 for physical assistance      Transfers                 General transfer comment: unable to complete due to weakness        ADL either performed or assessed with clinical judgement   ADL Overall ADL's : Needs assistance/impaired                                       General ADL Comments: Pt requiring increased assistance with ADL tasks  due to pain and weakness. Pt is unable to complete any LB tasks, becomes SOB quickly with UB tasks     Vision Baseline Vision/History: Wears glasses Wears Glasses: Reading only Patient Visual Report: No change from baseline Vision Assessment?: No apparent visual deficits            Pertinent Vitals/Pain Pain Assessment: Faces Faces Pain Scale: Hurts even more Pain Location: back  Pain Descriptors / Indicators: Aching;Tightness Pain Intervention(s): Limited activity within patient's tolerance;Monitored during session;Repositioned     Hand Dominance Right   Extremity/Trunk Assessment Upper Extremity Assessment Upper Extremity Assessment: Generalized weakness(grossly 4-/5)   Lower Extremity Assessment Lower Extremity Assessment: Defer to PT evaluation   Cervical / Trunk Assessment Cervical / Trunk Assessment: Other exceptions Cervical / Trunk Exceptions: difficulty with trunk flexion   Communication Communication Communication: No difficulties   Cognition Arousal/Alertness: Awake/alert Behavior During Therapy: WFL for tasks assessed/performed Overall Cognitive Status: Within Functional Limits for tasks assessed                                                Home Living   Living Arrangements: Spouse/significant other Available Help at Discharge: Family;Available PRN/intermittently Type of Home: House Home Access: Ramped entrance     Home Layout: One level  Home Equipment: Walker - 4 wheels;Walker - 2 wheels;Wheelchair - manual   Additional Comments: spouse works, patient home alone, lift chair      Prior Functioning/Environment Level of Independence: Needs assistance  Gait / Transfers Assistance Needed: limited distance in house and to car with 4 wheeled RW and assistance ADL's / Homemaking Assistance Needed: sponge bathes, has HH for lymphadema wrapping/ pumps, pt stays in housecoat mostly, rarely donns pants/socks/shoes  and family assists            OT Problem List: Decreased strength;Decreased activity tolerance;Impaired balance (sitting and/or standing);Decreased safety awareness                       Co-evaluation PT/OT/SLP Co-Evaluation/Treatment: Yes Reason for Co-Treatment: Complexity of the patient's impairments (multi-system involvement);For patient/therapist safety;To address functional/ADL transfers   OT goals addressed during session: ADL's and self-care;Proper use of Adaptive equipment and DME         End of Session Equipment Utilized During Treatment: Gait belt;Rolling walker  Activity Tolerance: Patient limited by pain Patient left: in bed;with call bell/phone within reach  OT Visit Diagnosis: Muscle weakness (generalized) (M62.81)                Time: 4098-1191 OT Time Calculation (min): 24 min Charges:  OT General Charges $OT Visit: 1 Visit OT Evaluation $OT Eval Moderate Complexity: 1 [address] OTR/L  [phone] 01/11/2018, 9:24 AM

## 2018-01-12 ENCOUNTER — Inpatient Hospital Stay (HOSPITAL_COMMUNITY): Payer: Medicare Other

## 2018-01-12 DIAGNOSIS — Z7401 Bed confinement status: Secondary | ICD-10-CM | POA: Diagnosis not present

## 2018-01-12 DIAGNOSIS — R109 Unspecified abdominal pain: Secondary | ICD-10-CM | POA: Diagnosis not present

## 2018-01-12 DIAGNOSIS — R5381 Other malaise: Secondary | ICD-10-CM | POA: Diagnosis not present

## 2018-01-12 DIAGNOSIS — E118 Type 2 diabetes mellitus with unspecified complications: Secondary | ICD-10-CM | POA: Diagnosis not present

## 2018-01-12 DIAGNOSIS — R0682 Tachypnea, not elsewhere classified: Secondary | ICD-10-CM | POA: Diagnosis not present

## 2018-01-12 DIAGNOSIS — A4151 Sepsis due to Escherichia coli [E. coli]: Secondary | ICD-10-CM | POA: Diagnosis not present

## 2018-01-12 DIAGNOSIS — Z87442 Personal history of urinary calculi: Secondary | ICD-10-CM | POA: Diagnosis not present

## 2018-01-12 DIAGNOSIS — Z96653 Presence of artificial knee joint, bilateral: Secondary | ICD-10-CM | POA: Diagnosis not present

## 2018-01-12 DIAGNOSIS — J810 Acute pulmonary edema: Secondary | ICD-10-CM | POA: Diagnosis not present

## 2018-01-12 DIAGNOSIS — I10 Essential (primary) hypertension: Secondary | ICD-10-CM | POA: Diagnosis not present

## 2018-01-12 DIAGNOSIS — E039 Hypothyroidism, unspecified: Secondary | ICD-10-CM | POA: Diagnosis not present

## 2018-01-12 DIAGNOSIS — K219 Gastro-esophageal reflux disease without esophagitis: Secondary | ICD-10-CM | POA: Diagnosis not present

## 2018-01-12 DIAGNOSIS — Z79899 Other long term (current) drug therapy: Secondary | ICD-10-CM | POA: Diagnosis not present

## 2018-01-12 DIAGNOSIS — E119 Type 2 diabetes mellitus without complications: Secondary | ICD-10-CM | POA: Diagnosis not present

## 2018-01-12 DIAGNOSIS — A419 Sepsis, unspecified organism: Secondary | ICD-10-CM | POA: Diagnosis not present

## 2018-01-12 DIAGNOSIS — E162 Hypoglycemia, unspecified: Secondary | ICD-10-CM | POA: Diagnosis not present

## 2018-01-12 DIAGNOSIS — I469 Cardiac arrest, cause unspecified: Secondary | ICD-10-CM | POA: Diagnosis not present

## 2018-01-12 DIAGNOSIS — F17210 Nicotine dependence, cigarettes, uncomplicated: Secondary | ICD-10-CM | POA: Diagnosis not present

## 2018-01-12 DIAGNOSIS — Z9181 History of falling: Secondary | ICD-10-CM | POA: Diagnosis not present

## 2018-01-12 DIAGNOSIS — R0602 Shortness of breath: Secondary | ICD-10-CM | POA: Diagnosis not present

## 2018-01-12 DIAGNOSIS — J449 Chronic obstructive pulmonary disease, unspecified: Secondary | ICD-10-CM | POA: Diagnosis not present

## 2018-01-12 DIAGNOSIS — Z7982 Long term (current) use of aspirin: Secondary | ICD-10-CM | POA: Diagnosis not present

## 2018-01-12 LAB — MAGNESIUM: Magnesium: 1.7 mg/dL (ref 1.7–2.4)

## 2018-01-12 LAB — GLUCOSE, CAPILLARY
GLUCOSE-CAPILLARY: 117 mg/dL — AB (ref 70–99)
Glucose-Capillary: 118 mg/dL — ABNORMAL HIGH (ref 70–99)
Glucose-Capillary: 138 mg/dL — ABNORMAL HIGH (ref 70–99)

## 2018-01-12 LAB — CBC WITH DIFFERENTIAL/PLATELET
Basophils Absolute: 0 10*3/uL (ref 0.0–0.1)
Basophils Relative: 0 %
EOS ABS: 0.3 10*3/uL (ref 0.0–0.7)
Eosinophils Relative: 3 %
HCT: 30.9 % — ABNORMAL LOW (ref 36.0–46.0)
Hemoglobin: 9.5 g/dL — ABNORMAL LOW (ref 12.0–15.0)
Lymphocytes Relative: 16 %
Lymphs Abs: 1.8 10*3/uL (ref 0.7–4.0)
MCH: 23.9 pg — AB (ref 26.0–34.0)
MCHC: 30.7 g/dL (ref 30.0–36.0)
MCV: 77.8 fL — ABNORMAL LOW (ref 78.0–100.0)
MONO ABS: 1.4 10*3/uL — AB (ref 0.1–1.0)
Monocytes Relative: 12 %
NEUTROS PCT: 69 %
Neutro Abs: 8 10*3/uL — ABNORMAL HIGH (ref 1.7–7.7)
PLATELETS: 134 10*3/uL — AB (ref 150–400)
RBC: 3.97 MIL/uL (ref 3.87–5.11)
RDW: 16.9 % — ABNORMAL HIGH (ref 11.5–15.5)
WBC: 11.5 10*3/uL — ABNORMAL HIGH (ref 4.0–10.5)

## 2018-01-12 LAB — RENAL FUNCTION PANEL
ALBUMIN: 1.8 g/dL — AB (ref 3.5–5.0)
ANION GAP: 6 (ref 5–15)
BUN: 40 mg/dL — ABNORMAL HIGH (ref 8–23)
CALCIUM: 8.5 mg/dL — AB (ref 8.9–10.3)
CO2: 29 mmol/L (ref 22–32)
Chloride: 104 mmol/L (ref 98–111)
Creatinine, Ser: 1.34 mg/dL — ABNORMAL HIGH (ref 0.44–1.00)
GFR calc non Af Amer: 40 mL/min — ABNORMAL LOW (ref >60)
GFR, EST AFRICAN AMERICAN: 46 mL/min — AB (ref >60)
GLUCOSE: 114 mg/dL — AB (ref 70–99)
PHOSPHORUS: 2.8 mg/dL (ref 2.5–4.6)
Potassium: 4.3 mmol/L (ref 3.5–5.1)
SODIUM: 139 mmol/L (ref 135–145)

## 2018-01-12 MED ORDER — CIPROFLOXACIN HCL 250 MG PO TABS: 250.0000 mg | ORAL_TABLET | Freq: Two times a day (BID) | ORAL | 0 refills | Status: AC

## 2018-01-12 MED ORDER — CIPROFLOXACIN HCL 250 MG PO TABS: 250.0000 mg | ORAL_TABLET | Freq: Two times a day (BID) | ORAL | Status: DC

## 2018-01-12 MED ORDER — OXYCODONE-ACETAMINOPHEN 5-325 MG PO TABS: 1.0000 | ORAL_TABLET | Freq: Four times a day (QID) | ORAL | 0 refills | Status: AC | PRN

## 2018-01-12 NOTE — Discharge Summary (Signed)
Physician Discharge Summary  [facility] ZOX:[id number] DOB: 15-Apr-1951 DOA: 01/08/2018  PCP: Junie Spencer, FNP Urologist: Dr. Bjorn Pippin  Admit date: 01/08/2018 Discharge date: 01/12/2018  Disposition: SNF  Recommendations for Outpatient Follow-up:  1. Follow up with Urologist Dr. Annabell Howells in 2 weeks 2. Please obtain BMP/CBC in one week 3. Please test blood sugar 2-3 times per day.  4. Please follow up on the following pending results: final culture results  Discharge Condition: STABLE   CODE STATUS: FULL    Brief Hospitalization Summary: Please see all hospital notes, images, labs for full details of the hospitalization.  HPI: Jennifer Rasmussen is a 67 y.o. female with medical history significant for type 2 diabetes, morbid obesity, hypothyroidism, COPD, OSA on 3 L nasal cannula at night, hypertension, dyslipidemia, and GERD who began having some lower right-sided abdominal pain that began on Friday of last week.  This was associated with some fever and chills as well as recurrent episodes of nausea and vomiting with no hematemesis.  She was also noted to have an episode of diarrhea yesterday.  She states that her pain is worse when she coughs or pushes on her abdomen in that region.  She appears to have no significant alleviating factors.  The pain has been worsening over the course of the weekend.  ED Course: Vital signs demonstrate temperature 100.6 F as well as pulse of 126 and some soft blood pressure readings of 105/54.  Labs demonstrate leukocytosis of 18,000, hemoglobin 12.8 that is stable, BUN 57, and creatinine 2.77 with prior creatinine of 1.03 on 11/2017.  Her current glucose is 157.  Chest x-ray demonstrates mild cardiomegaly with pulmonary vascular congestion and CT of the abdomen and pelvis with contrast is remarkable for a 7 mm right UPJ stone with hydroureteronephrosis.  Of note, she also has some cholelithiasis, but no symptoms of cholecystitis.  She has been started on  some IV fluids as well as Rocephin empirically.  Urology, Dr. Wilson Singer has been contacted who will come to this facility for placement of right-sided renal stent and will follow.  Brief Admission Hx: Jennifer Rasmussen a 67 y.o.femalewith medical history significant fortype 2 diabetes, morbid obesity, hypothyroidism, COPD,OSA on 3 L nasal cannula at night, hypertension, dyslipidemia, and GERD who began having some lower right-sided abdominal pain that began on Friday of last week. Chest x-ray demonstrates mild cardiomegaly with pulmonary vascular congestion and CT of the abdomen and pelvis with contrast is remarkable for a 7 mm right UPJ stone with hydroureteronephrosis. Of note, she also has some cholelithiasis, but no symptoms of cholecystitis. She has been started on some IV fluids as well as Rocephin empirically. Urology, Dr. Wilson Singer has been contacted who will come to this facility for placement of right-sided renal stent   MDM/Assessment & Plan:   1. E coli Sepsis secondary to UTI with nephrolithiasis and right-sided UPJ obstruction.Sepsis is resolved now.  Pt is much better today.  Urology for cystoscopy and right ureteral stent placement post op day 4. Treated with Rocephin. Blood cultures (+) E coli. Lactic acid trended down.  Discharge on oral ciprofloxacin BID x 12 more days.  Follow up with Dr. Annabell Howells in 2 weeks to further address the renal stones.  Pt being discharged to SNF. 2. AKI likely secondary to above with dehydration. Improved.   avoid nephrotoxic agents. Repeat BMP in 1 week.  3. Pulmonary edema - Treated with IV lasix.  Pt is diuresed 2.9L.  Pt feeling much better  today.   4. COPD with OSA. Wears 3 L O2 at baseline at night. Duonebs as needed. Continue Symbicort. 5. Type 2 diabetes. Holding all insulin and antihyperglycemics as her blood sugars were running low.  Now they are running normal without treatment.  6. Leukocytosis - WBC trending down. Following.   7. Hypothyroidism. Continue Synthroid. 8. Hypoglycemia - Resolved when patient started eating.     9. Dyslipidemia. Continue statin. 10. GERD. PPI. 11. Hypertension. Resumed home carvedilol.  Holding lisinopril due to AKI.   DVT prophylaxis:SCDs Code Status:Full Family Communication:Husband and stepdaughter at bedside Consults called:Urology Disposition: SNF  Consultants:  Urology  Procedures:  Right ureteral stent placement  Discharge Diagnoses:  Principal Problem:   Sepsis (HCC) Active Problems:   Renal stone   Hypoglycemia   Hypothyroidism   Morbid obesity (HCC)   GERD (gastroesophageal reflux disease)   Hyperlipidemia associated with type 2 diabetes mellitus (HCC)   COPD (chronic obstructive pulmonary disease) (HCC)  Discharge Instructions: Discharge Instructions    Call MD for:  extreme fatigue   Complete by:  As directed    Call MD for:  persistant dizziness or light-headedness   Complete by:  As directed    Call MD for:  persistant nausea and vomiting   Complete by:  As directed    Call MD for:  severe uncontrolled pain   Complete by:  As directed    Diet - low sodium heart healthy   Complete by:  As directed    Increase activity slowly   Complete by:  As directed      Allergies as of 01/12/2018   No Known Allergies     Medication List    STOP taking these medications   empagliflozin 25 MG Tabs tablet Commonly known as:  JARDIANCE   furosemide 20 MG tablet Commonly known as:  LASIX   glimepiride 4 MG tablet Commonly known as:  AMARYL   glucose blood test strip Commonly known as:  ONE TOUCH ULTRA TEST   lisinopril 10 MG tablet Commonly known as:  PRINIVIL,ZESTRIL   meloxicam 15 MG tablet Commonly known as:  MOBIC   traMADol 50 MG tablet Commonly known as:  ULTRAM     TAKE these medications   albuterol (2.5 MG/3ML) 0.083% nebulizer solution Commonly known as:  PROVENTIL INHALE 1 VIAL VIA NEBULIZER EVERY 6 HOURS AS  NEEDED FOR WHEEZING OR SHORTNESS OF  BREATH.   aspirin 81 MG tablet Take 81 mg by mouth daily.   budesonide-formoterol 160-4.5 MCG/ACT inhaler Commonly known as:  SYMBICORT USE 2 INHALATIONS TWICE A  DAY   carvedilol 12.5 MG tablet Commonly known as:  COREG Take 1 tablet (12.5 mg total) by mouth 2 (two) times daily with a meal.   ciprofloxacin 250 MG tablet Commonly known as:  CIPRO Take 1 tablet (250 mg total) by mouth 2 (two) times daily for 12 days.   ipratropium-albuterol 0.5-2.5 (3) MG/3ML Soln Commonly known as:  DUONEB Take 3 mLs by nebulization 4 (four) times daily as needed.   levothyroxine 50 MCG tablet Commonly known as:  SYNTHROID, LEVOTHROID Take 1 tablet (50 mcg total) by mouth daily.   oxyCODONE-acetaminophen 5-325 MG tablet Commonly known as:  PERCOCET/ROXICET Take 1-2 tablets by mouth every 6 (six) hours as needed for severe pain.   ranitidine 150 MG tablet Commonly known as:  ZANTAC Take 150 mg by mouth daily.   simvastatin 20 MG tablet Commonly known as:  ZOCOR TAKE 1 TABLET BY MOUTH  DAILY AT 6PM   Vitamin D 2000 units tablet Take 2,000 Units by mouth daily.       Contact information for follow-up providers    Bjorn Pippin, MD. Schedule an appointment as soon as possible for a visit in 2 week(s).   Specialty:  Urology Why:  Hospital Follow Up  Contact information: [address] [place] [address] [phone]            Contact information for after-discharge care    Destination    [facility] .   Service:  Skilled Nursing Contact information: [address] [place] [address] [phone]                 No Known Allergies Allergies as of 01/12/2018   No Known Allergies     Medication List    STOP taking these medications   empagliflozin 25 MG Tabs tablet Commonly known as:  JARDIANCE   furosemide 20 MG tablet Commonly known as:  LASIX   glimepiride 4 MG tablet Commonly  known as:  AMARYL   glucose blood test strip Commonly known as:  ONE TOUCH ULTRA TEST   lisinopril 10 MG tablet Commonly known as:  PRINIVIL,ZESTRIL   meloxicam 15 MG tablet Commonly known as:  MOBIC   traMADol 50 MG tablet Commonly known as:  ULTRAM     TAKE these medications   albuterol (2.5 MG/3ML) 0.083% nebulizer solution Commonly known as:  PROVENTIL INHALE 1 VIAL VIA NEBULIZER EVERY 6 HOURS AS NEEDED FOR WHEEZING OR SHORTNESS OF  BREATH.   aspirin 81 MG tablet Take 81 mg by mouth daily.   budesonide-formoterol 160-4.5 MCG/ACT inhaler Commonly known as:  SYMBICORT USE 2 INHALATIONS TWICE A  DAY   carvedilol 12.5 MG tablet Commonly known as:  COREG Take 1 tablet (12.5 mg total) by mouth 2 (two) times daily with a meal.   ciprofloxacin 250 MG tablet Commonly known as:  CIPRO Take 1 tablet (250 mg total) by mouth 2 (two) times daily for 12 days.   ipratropium-albuterol 0.5-2.5 (3) MG/3ML Soln Commonly known as:  DUONEB Take 3 mLs by nebulization 4 (four) times daily as needed.   levothyroxine 50 MCG tablet Commonly known as:  SYNTHROID, LEVOTHROID Take 1 tablet (50 mcg total) by mouth daily.   oxyCODONE-acetaminophen 5-325 MG tablet Commonly known as:  PERCOCET/ROXICET Take 1-2 tablets by mouth every 6 (six) hours as needed for severe pain.   ranitidine 150 MG tablet Commonly known as:  ZANTAC Take 150 mg by mouth daily.   simvastatin 20 MG tablet Commonly known as:  ZOCOR TAKE 1 TABLET BY MOUTH  DAILY AT 6PM   Vitamin D 2000 units tablet Take 2,000 Units by mouth daily.       Procedures/Studies: Dg Abd 1 View  Result Date: 01/12/2018 CLINICAL DATA:  Right abdominal pain EXAM: ABDOMEN - 1 VIEW COMPARISON:  01/08/2018 FINDINGS: Right double-J ureteral stent is in place. Several calculi project over the proximal right ureter and right renal pelvis. The stomach is distended. There is otherwise no disproportionate dilatation of bowel. No obvious free  intraperitoneal gas. Severe degenerative change of the hip joints. IMPRESSION: Right nephrolithiasis. Gastric distention. Electronically Signed   By: Jolaine Click M.D.   On: 01/12/2018 13:24   Ct Abdomen Pelvis W Contrast  Result Date: 01/08/2018 CLINICAL DATA:  Generalized abdominal pain, nausea and vomiting with diarrhea x2 days. Low back pain. EXAM: CT ABDOMEN AND PELVIS WITH  CONTRAST TECHNIQUE: Multidetector CT imaging of the abdomen and pelvis was performed using the standard protocol following bolus administration of intravenous contrast. CONTRAST:  ISOVUE-300 IOPAMIDOL (ISOVUE-300) INJECTION 61% COMPARISON:  Abdomen radiographs 03/31/2009 FINDINGS: Lower chest: Borderline cardiomegaly. No pericardial effusion. Atelectasis at the lung bases. Hepatobiliary: Small granulomata in the right and left hepatic lobes. No enhancing mass lesions. No biliary dilatation. Gallbladder is physiologically distended with layering calculi. No mural thickening, pericholecystic fluid or secondary signs of acute cholecystitis. Pancreas: No enhancing mass of the pancreas. No inflammation or ductal dilatation. Spleen: Normal size spleen without acute abnormality. Adrenals/Urinary Tract: Mild thickening of the adrenal glands. There is approximately 7 mm right ureteropelvic juncture stone causing mild-to-moderate right-sided hydroureteronephrosis. Additional nonobstructing renal pelvic, interpolar and lower pole renal calculi are identified in several coarse clusters, the largest cluster in the interpolar aspect measuring 14 x 21 x 14 mm. A cluster of 3 calculi are noted in the right renal pelvis without obstruction measuring 11 x 9 x 11 mm in aggregate. A cluster of calculi in the lower pole the right kidney measuring 14 x 12 x 9 mm is also noted. Small focus of air is identified within the interpolar intrarenal collecting system possibly from a recently passed stone. Emphysematous pyelonephritis is believed less likely as  it is contained within the renal collecting system without parenchymal involvement. Bilateral low-density lesions in both kidneys compatible with cysts are identified the largest on the left measuring 3.2 cm. Punctate left sided interpolar and lower pole renal calculi are also present. Stomach/Bowel: Gastric band device is noted oriented along the 2:30 to 8:30 axis on the coronal reformats. No evidence of bowel obstruction or inflammation. Vascular/Lymphatic: Moderate aortoiliac atherosclerosis. No lymphadenopathy. Reproductive: Uterus and bilateral adnexa are unremarkable. Other: No free air nor free fluid. Musculoskeletal: Thoracolumbar spondylosis with marked degenerative disc disease, vacuum disc phenomenon and endplate spurring of the included lower thoracic and lumbar vertebrae. Degenerative lumbar facet arthropathy is also present. No acute nor suspicious osseous abnormality. IMPRESSION: 1. 7 mm right UPJ stone causing mild-to-moderate right-sided hydroureteronephrosis. 2. Staghorn calculi of the right kidney as above described. Small focus of air noted within right intrarenal collecting system may be secondary to a previously passed stone. Less commonly however, findings can also be seen in xanthogranulomatous pyelonephritis given the combination of these findings. 3. Nonobstructing left-sided renal calculi and bilateral renal cysts. 4. Uncomplicated cholelithiasis. 5. Hepatic granulomas. 6. Gastric band device somewhat horizontally oriented between 2:30 and 8:30 axis. This appears to be chronically situated in this orientation. 7. Thoracolumbar spondylosis. Electronically Signed   By: Tollie Eth M.D.   On: 01/08/2018 18:15   Dg Retrograde Pyelogram  Result Date: 01/08/2018 CLINICAL DATA:  Right UPJ stone EXAM: RETROGRADE PYELOGRAM COMPARISON:  01/08/2018 CT abdomen/pelvis FINDINGS: Fluoroscopy time 0 minutes 20 seconds. Multiple spot fluoroscopic intraoperative radiographs of the right renal collecting  system and proximal right ureter were provided. Multiple filling defects are noted within the right renal pelvis compatible with stones. The final radiograph demonstrates the superior tip of a right nephroureteral stent in the right renal pelvis. IMPRESSION: Multiple filling defects within the right renal pelvis compatible with stones. Superior tip of right nephroureteral stent is in the right renal pelvis on the final radiograph. Electronically Signed   By: Delbert Phenix M.D.   On: 01/08/2018 23:50   Dg Chest Port 1 View  Result Date: 01/11/2018 CLINICAL DATA:  Cough EXAM: PORTABLE CHEST 1 VIEW COMPARISON:  01/09/2018 FINDINGS: Cardiac shadow remains  enlarged. Aortic calcifications are again seen. Increasing vascular congestion and mild pulmonary edema is noted particularly on the right. No bony abnormality is noted. IMPRESSION: Increasing vascular congestion and pulmonary edema. Electronically Signed   By: Alcide Clever M.D.   On: 01/11/2018 10:24   Dg Chest Port 1 View  Result Date: 01/09/2018 CLINICAL DATA:  Pulmonary edema EXAM: PORTABLE CHEST 1 VIEW COMPARISON:  01/08/2018 FINDINGS: Left suprahilar opacity, likely reflecting mild interstitial edema, new. No definite pleural effusions. No pneumothorax. Cardiomegaly. IMPRESSION: Cardiomegaly with mild left perihilar edema. Electronically Signed   By: Charline Bills M.D.   On: 01/09/2018 08:35   Dg Chest Port 1 View  Result Date: 01/08/2018 CLINICAL DATA:  Cough and abdominal pain EXAM: PORTABLE CHEST 1 VIEW COMPARISON:  08/09/2016 FINDINGS: There is shallow lung inflation. There is mild cardiomegaly. Pulmonary vascular congestion without overt edema. There is no pleural effusion or pneumothorax. IMPRESSION: Mild cardiomegaly with shallow lung inflation and pulmonary vascular congestion without overt pulmonary edema. Electronically Signed   By: Deatra Robinson M.D.   On: 01/08/2018 17:28      Subjective: Pt much more alert and interactive, feeling  better, pain is getting better every day.    Discharge Exam: Vitals:   01/12/18 0800 01/12/18 1353  BP:  140/61  Pulse:  89  Resp:  19  Temp:  98.2 F (36.8 C)  SpO2: 92% 93%   Vitals:   01/11/18 2049 01/12/18 0530 01/12/18 0800 01/12/18 1353  BP: 121/74 (!) 119/52  140/61  Pulse: 93 87  89  Resp:    19  Temp: 98.8 F (37.1 C) 98.6 F (37 C)  98.2 F (36.8 C)  TempSrc: Oral Oral  Oral  SpO2: 94% 94% 92% 93%  Weight:      Height:       General exam: awake, alert, NAD. Cooperative.  Respiratory system:clear No increased work of breathing. Cardiovascular system: S1 & S2 heard.  Gastrointestinal system: Abdomen is nondistended, soft and nontender. Normal bowel sounds heard. Central nervous system: Alert and oriented. No focal neurological deficits. Extremities: no cyanosis.   The results of significant diagnostics from this hospitalization (including imaging, microbiology, ancillary and laboratory) are listed below for reference.     Microbiology: Recent Results (from the past 240 hour(s))  Culture, blood (routine x 2)     Status: Abnormal   Collection Time: 01/08/18  5:15 PM  Result Value Ref Range Status   Specimen Description   Final    BLOOD RIGHT ANTECUBITAL Performed at [facility], [address], [place], [place] [address]    Special Requests   Final    BOTTLES DRAWN AEROBIC AND ANAEROBIC Blood Culture adequate volume Performed at [facility], [address] [place], [place] [address]    Culture  Setup Time   Final    GRAM NEGATIVE RODS AEROBIC AND ANAEROBIC BOTTLES Gram Stain Report Called to,Read Back By and Verified With: MURPHY E. AT 0832A ON [id number] BY THOMPSON S    Culture ESCHERICHIA COLI (A)  Final   Report Status 01/11/2018 FINAL  Final   Organism ID, Bacteria ESCHERICHIA COLI  Final      Susceptibility   Escherichia coli - MIC*    AMPICILLIN 8 SENSITIVE Sensitive     CEFAZOLIN <=4 SENSITIVE Sensitive     CEFEPIME <=1 SENSITIVE  Sensitive     CEFTAZIDIME <=1 SENSITIVE Sensitive     CEFTRIAXONE <=1 SENSITIVE Sensitive     CIPROFLOXACIN <=0.25 SENSITIVE  Sensitive     GENTAMICIN <=1 SENSITIVE Sensitive     IMIPENEM <=0.25 SENSITIVE Sensitive     TRIMETH/SULFA <=20 SENSITIVE Sensitive     AMPICILLIN/SULBACTAM 4 SENSITIVE Sensitive     PIP/TAZO <=4 SENSITIVE Sensitive     Extended ESBL NEGATIVE Sensitive     * ESCHERICHIA COLI  Blood Culture ID Panel (Reflexed)     Status: Abnormal   Collection Time: 01/08/18  5:15 PM  Result Value Ref Range Status   Enterococcus species NOT DETECTED NOT DETECTED Final   Listeria monocytogenes NOT DETECTED NOT DETECTED Final   Staphylococcus species NOT DETECTED NOT DETECTED Final   Staphylococcus aureus NOT DETECTED NOT DETECTED Final   Streptococcus species NOT DETECTED NOT DETECTED Final   Streptococcus agalactiae NOT DETECTED NOT DETECTED Final   Streptococcus pneumoniae NOT DETECTED NOT DETECTED Final   Streptococcus pyogenes NOT DETECTED NOT DETECTED Final   Acinetobacter baumannii NOT DETECTED NOT DETECTED Final   Enterobacteriaceae species DETECTED (A) NOT DETECTED Final    Comment: Enterobacteriaceae represent a large family of gram-negative bacteria, not a single organism. CRITICAL RESULT CALLED TO, READ BACK BY AND VERIFIED WITH: Ermalene Searing PharmD 15:20 01/09/18 ([email or web address])    Enterobacter cloacae complex NOT DETECTED NOT DETECTED Final   Escherichia coli DETECTED (A) NOT DETECTED Final    Comment: CRITICAL RESULT CALLED TO, READ BACK BY AND VERIFIED WITH: Ermalene Searing PharmD 15:20 01/09/18 ([email or web address])    Klebsiella oxytoca NOT DETECTED NOT DETECTED Final   Klebsiella pneumoniae NOT DETECTED NOT DETECTED Final   Proteus species NOT DETECTED NOT DETECTED Final   Serratia marcescens NOT DETECTED NOT DETECTED Final   Carbapenem resistance NOT DETECTED NOT DETECTED Final   Haemophilus influenzae NOT DETECTED NOT DETECTED Final   Neisseria meningitidis NOT DETECTED NOT DETECTED  Final   Pseudomonas aeruginosa NOT DETECTED NOT DETECTED Final   Candida albicans NOT DETECTED NOT DETECTED Final   Candida glabrata NOT DETECTED NOT DETECTED Final   Candida krusei NOT DETECTED NOT DETECTED Final   Candida parapsilosis NOT DETECTED NOT DETECTED Final   Candida tropicalis NOT DETECTED NOT DETECTED Final  Culture, blood (routine x 2)     Status: Abnormal   Collection Time: 01/08/18  5:21 PM  Result Value Ref Range Status   Specimen Description   Final    LEFT ANTECUBITAL Performed at [facility], [address], [place], [place] [address]    Special Requests   Final    BOTTLES DRAWN AEROBIC AND ANAEROBIC Blood Culture adequate volume Performed at [facility], [address] [place], [place] [address]    Culture  Setup Time   Final    GRAM NEGATIVE RODS AEROBIC AND ANAEROBIC BOTTLES Gram Stain Report Called to,Read Back By and Verified With: MURPHY E. AT 0832A ON [id number] BY THOMPSON S. Performed at [facility], [address], [place], [place] [address]    Culture (A)  Final    ESCHERICHIA COLI SUSCEPTIBILITIES PERFORMED ON PREVIOUS CULTURE WITHIN THE LAST 5 DAYS. Performed at [facility], [address], [place], [place] [address]    Report Status 01/11/2018 FINAL  Final  Urine culture     Status: Abnormal   Collection Time: 01/08/18  7:00 PM  Result Value Ref Range Status   Specimen Description   Final    URINE, CATHETERIZED Performed at [facility], [address], [place], [place] [address]    Special Requests   Final    NONE Performed at [facility],  [address], [place], [place] [address]    Culture (A)  Final    <10,000 COLONIES/mL INSIGNIFICANT GROWTH Performed at [facility], [address], [place], [place] [address]    Report Status 01/10/2018 FINAL  Final  MRSA PCR Screening     Status: None   Collection Time: 01/08/18 10:59 PM  Result Value Ref Range Status   MRSA by PCR NEGATIVE NEGATIVE Final    Comment:        The  GeneXpert MRSA Assay (FDA approved for NASAL specimens only), is one component of a comprehensive MRSA colonization surveillance program. It is not intended to diagnose MRSA infection nor to guide or monitor treatment for MRSA infections. Performed at [facility], [address], [place], [place] [address]      Labs: BNP (last 3 results) No results for input(s): BNP in the last 8760 hours. Basic Metabolic Panel: Recent Labs  Lab 01/08/18 1714 01/09/18 0407 01/10/18 0504 01/11/18 0422 01/12/18 0459  NA 136 136 137 137 139  K 4.9 4.3 4.3 4.3 4.3  CL 97* 102 103 103 104  CO2 27 25 26 27 29   GLUCOSE 157* 77 72 153* 114*  BUN 57* 55* 55* 46* 40*  CREATININE 2.77* 2.51* 2.13* 1.60* 1.34*  CALCIUM 8.3* 7.5* 7.8* 8.1* 8.5*  MG  --   --  1.7 1.8 1.7  PHOS  --   --  3.9 3.3 2.8   Liver Function Tests: Recent Labs  Lab 01/08/18 1714 01/10/18 0504 01/11/18 0422 01/12/18 0459  AST 15  --   --   --   ALT 11*  --   --   --   ALKPHOS 114  --   --   --   BILITOT 0.9  --   --   --   PROT 6.8  --   --   --   ALBUMIN 2.5* 1.9* 1.8* 1.8*   Recent Labs  Lab 01/08/18 1714  LIPASE 12   No results for input(s): AMMONIA in the last 168 hours. CBC: Recent Labs  Lab 01/08/18 1714 01/09/18 0407 01/10/18 0504 01/11/18 0422 01/12/18 0459  WBC 18.3* 13.1* 10.2 9.6 11.5*  NEUTROABS 17.2*  --  7.9* 6.7 8.0*  HGB 11.9* 9.9* 9.5* 9.4* 9.5*  HCT 37.5 31.0* 30.9* 30.7* 30.9*  MCV 77.3* 77.1* 78.0 78.1 77.8*  PLT 132* 104* 104* 106* 134*   Cardiac Enzymes: Recent Labs  Lab 01/08/18 1714  TROPONINI <0.03   BNP: Invalid input(s): POCBNP CBG: Recent Labs  Lab 01/11/18 1109 01/11/18 1607 01/11/18 2051 01/12/18 0309 01/12/18 0826  GLUCAP 171* 151* 186* 117* 118*   D-Dimer No results for input(s): DDIMER in the last 72 hours. Hgb A1c No results for input(s): HGBA1C in the last 72 hours. Lipid Profile No results for input(s): CHOL, HDL, LDLCALC, TRIG, CHOLHDL, LDLDIRECT  in the last 72 hours. Thyroid function studies No results for input(s): TSH, T4TOTAL, T3FREE, THYROIDAB in the last 72 hours.  Invalid input(s): FREET3 Anemia work up No results for input(s): VITAMINB12, FOLATE, FERRITIN, TIBC, IRON, RETICCTPCT in the last 72 hours. Urinalysis    Component Value Date/Time   COLORURINE YELLOW 01/08/2018 1900   APPEARANCEUR CLOUDY (A) 01/08/2018 1900   LABSPEC 1.037 (H) 01/08/2018 1900   PHURINE 5.0 01/08/2018 1900   GLUCOSEU >=500 (A) 01/08/2018 1900   HGBUR MODERATE (A) 01/08/2018 1900   BILIRUBINUR NEGATIVE 01/08/2018 1900   KETONESUR NEGATIVE 01/08/2018 1900   PROTEINUR 30 (A) 01/08/2018  1900   NITRITE NEGATIVE 01/08/2018 1900   LEUKOCYTESUR SMALL (A) 01/08/2018 1900   Sepsis Labs Invalid input(s): PROCALCITONIN,  WBC,  LACTICIDVEN Microbiology Recent Results (from the past 240 hour(s))  Culture, blood (routine x 2)     Status: Abnormal   Collection Time: 01/08/18  5:15 PM  Result Value Ref Range Status   Specimen Description   Final    BLOOD RIGHT ANTECUBITAL Performed at [facility], [address], [place], [place] [address]    Special Requests   Final    BOTTLES DRAWN AEROBIC AND ANAEROBIC Blood Culture adequate volume Performed at [facility], [address], [place], [place] [address]    Culture  Setup Time   Final    GRAM NEGATIVE RODS AEROBIC AND ANAEROBIC BOTTLES Gram Stain Report Called to,Read Back By and Verified With: MURPHY E. AT 0832A ON [id number] BY THOMPSON S    Culture ESCHERICHIA COLI (A)  Final   Report Status 01/11/2018 FINAL  Final   Organism ID, Bacteria ESCHERICHIA COLI  Final      Susceptibility   Escherichia coli - MIC*    AMPICILLIN 8 SENSITIVE Sensitive     CEFAZOLIN <=4 SENSITIVE Sensitive     CEFEPIME <=1 SENSITIVE Sensitive     CEFTAZIDIME <=1 SENSITIVE Sensitive     CEFTRIAXONE <=1 SENSITIVE Sensitive     CIPROFLOXACIN <=0.25 SENSITIVE Sensitive     GENTAMICIN <=1 SENSITIVE Sensitive      IMIPENEM <=0.25 SENSITIVE Sensitive     TRIMETH/SULFA <=20 SENSITIVE Sensitive     AMPICILLIN/SULBACTAM 4 SENSITIVE Sensitive     PIP/TAZO <=4 SENSITIVE Sensitive     Extended ESBL NEGATIVE Sensitive     * ESCHERICHIA COLI  Blood Culture ID Panel (Reflexed)     Status: Abnormal   Collection Time: 01/08/18  5:15 PM  Result Value Ref Range Status   Enterococcus species NOT DETECTED NOT DETECTED Final   Listeria monocytogenes NOT DETECTED NOT DETECTED Final   Staphylococcus species NOT DETECTED NOT DETECTED Final   Staphylococcus aureus NOT DETECTED NOT DETECTED Final   Streptococcus species NOT DETECTED NOT DETECTED Final   Streptococcus agalactiae NOT DETECTED NOT DETECTED Final   Streptococcus pneumoniae NOT DETECTED NOT DETECTED Final   Streptococcus pyogenes NOT DETECTED NOT DETECTED Final   Acinetobacter baumannii NOT DETECTED NOT DETECTED Final   Enterobacteriaceae species DETECTED (A) NOT DETECTED Final    Comment: Enterobacteriaceae represent a large family of gram-negative bacteria, not a single organism. CRITICAL RESULT CALLED TO, READ BACK BY AND VERIFIED WITH: Ermalene Searing PharmD 15:20 01/09/18 ([email or web address])    Enterobacter cloacae complex NOT DETECTED NOT DETECTED Final   Escherichia coli DETECTED (A) NOT DETECTED Final    Comment: CRITICAL RESULT CALLED TO, READ BACK BY AND VERIFIED WITH: Ermalene Searing PharmD 15:20 01/09/18 ([email or web address])    Klebsiella oxytoca NOT DETECTED NOT DETECTED Final   Klebsiella pneumoniae NOT DETECTED NOT DETECTED Final   Proteus species NOT DETECTED NOT DETECTED Final   Serratia marcescens NOT DETECTED NOT DETECTED Final   Carbapenem resistance NOT DETECTED NOT DETECTED Final   Haemophilus influenzae NOT DETECTED NOT DETECTED Final   Neisseria meningitidis NOT DETECTED NOT DETECTED Final   Pseudomonas aeruginosa NOT DETECTED NOT DETECTED Final   Candida albicans NOT DETECTED NOT DETECTED Final   Candida glabrata NOT DETECTED NOT DETECTED Final   Candida  krusei NOT DETECTED NOT DETECTED Final   Candida parapsilosis NOT DETECTED NOT DETECTED Final   Candida tropicalis NOT DETECTED  NOT DETECTED Final  Culture, blood (routine x 2)     Status: Abnormal   Collection Time: 01/08/18  5:21 PM  Result Value Ref Range Status   Specimen Description   Final    LEFT ANTECUBITAL Performed at [facility], [address] [place], [place] [address]    Special Requests   Final    BOTTLES DRAWN AEROBIC AND ANAEROBIC Blood Culture adequate volume Performed at [facility], [address] [place], [place] [address]    Culture  Setup Time   Final    GRAM NEGATIVE RODS AEROBIC AND ANAEROBIC BOTTLES Gram Stain Report Called to,Read Back By and Verified With: MURPHY E. AT 0832A ON [id number] BY THOMPSON S. Performed at [facility], [address] [place], [place] [address]    Culture (A)  Final    ESCHERICHIA COLI SUSCEPTIBILITIES PERFORMED ON PREVIOUS CULTURE WITHIN THE LAST 5 DAYS. Performed at [facility], [address] [place], [place] [address]    Report Status 01/11/2018 FINAL  Final  Urine culture     Status: Abnormal   Collection Time: 01/08/18  7:00 PM  Result Value Ref Range Status   Specimen Description   Final    URINE, CATHETERIZED Performed at [facility], [address] [place], [place] [address]    Special Requests   Final    NONE Performed at [facility], [address] [place], [place] [address]    Culture (A)  Final    <10,000 COLONIES/mL INSIGNIFICANT GROWTH Performed at [facility], [address] [place], [place] [address]    Report Status 01/10/2018 FINAL  Final  MRSA PCR Screening     Status: None   Collection Time: 01/08/18 10:59 PM  Result Value Ref Range Status   MRSA by PCR NEGATIVE NEGATIVE Final    Comment:        The GeneXpert MRSA Assay (FDA approved for NASAL specimens only), is one component of a comprehensive MRSA colonization surveillance program. It is not intended to diagnose  MRSA infection nor to guide or monitor treatment for MRSA infections. Performed at [facility], [address] [place], [place] [address]    Time coordinating discharge: 33 mins  SIGNED:  Standley Dakinslanford Issak Goley, MD  [facility] 01/12/2018, 3:21 PM Pager [phone]  If 7PM-7AM, please contact night-coverage [email or web address] Password [email or web address]

## 2018-01-12 NOTE — Clinical Social Work Placement (Signed)
   CLINICAL SOCIAL WORK PLACEMENT  NOTE  Date:  01/12/2018  Patient Details  Name: Jennifer Rasmussen MRN: [id number] Date of Birth: January 30, 1951  Clinical Social Work is seeking post-discharge placement for this patient at the Skilled  Nursing Facility level of care (*CSW will initial, date and re-position this form in  chart as items are completed):  Yes   Patient/family provided with [facility] [place] Clinical Social Work Department's list of facilities offering this level of care within the geographic area requested by the patient (or if unable, by the patient's family).  Yes   Patient/family informed of their freedom to choose among providers that offer the needed level of care, that participate in Medicare, Medicaid or managed care program needed by the patient, have an available bed and are willing to accept the patient.  Yes   Patient/family informed of [facility]'s ownership interest in [facility] and [facility], as well as of the fact that they are under no obligation to receive care at these facilities.  PASRR submitted to EDS on 01/11/18     PASRR number received on 01/11/18     Existing PASRR number confirmed on       FL2 transmitted to all facilities in geographic area requested by pt/family on 01/11/18     FL2 transmitted to all facilities within larger geographic area on       Patient informed that his/her managed care company has contracts with or will negotiate with certain facilities, including the following:        Yes   Patient/family informed of bed offers received.  [facility]     Physician recommends and patient chooses bed at      Patient to be [facility] [place] on 01/12/18.  Patient to be transferred to facility by [facility]     Patient family notified on 01/12/18 of transfer.  Name of family member notified:  spouse, Mr. Fredderick ErbSouthern     PHYSICIAN       Additional Comment:  Facility notified.  Discharge clinicals sent. LCSW signing off.   _______________________________________________ Annice NeedySettle, Artice Holohan D, LCSW 01/12/2018, 3:54 PM

## 2018-01-12 NOTE — Care Management Important Message (Signed)
Important Message  Patient Details  Name: Jennifer Rasmussen MRN: [id number] Date of Birth: 24-Nov-1950   Medicare Important Message Given:  Yes    Renie OraHawkins, Devita Nies Smith 01/12/2018, 11:45 AM

## 2018-01-12 NOTE — Progress Notes (Signed)
Removed both IVs-clean, dry, intact. Talk[facility] with patient and family about [facility]. [facility] to [facility].

## 2018-01-12 NOTE — Progress Notes (Signed)
4 Days Post-Op  Subjective: CC: right flank pain.  Hx: Jennifer Rasmussen is improving from the sepsis related to the right UPJ stone but she continues to have right flank and abdominal pain that can be worse with movement and voiding.   On KUB today she has multiple right renal stones measuring 6-9mm in the RUP and pelvis around the stent which is in good position.    ROS:  Review of Systems  Constitutional: Negative for fever.  Respiratory: Positive for shortness of breath.   Gastrointestinal: Positive for abdominal pain. Negative for nausea.  Genitourinary: Positive for flank pain.    Anti-infectives: Anti-infectives (From admission, onward)   Start     Dose/Rate Route Frequency Ordered Stop   2018-02-06 0800  ciprofloxacin (CIPRO) tablet 250 mg     250 mg Oral 2 times daily 01/12/18 1103 01/23/18 0759   01/11/18 1800  cefTRIAXone (ROCEPHIN) 2 g in sodium chloride 0.9 % 100 mL IVPB     2 g 200 mL/hr over 30 Minutes Intravenous Every 24 hours 01/11/18 1019 02/06/2018 1759   01/09/18 1800  cefTRIAXone (ROCEPHIN) 1 g in sodium chloride 0.9 % 100 mL IVPB  Status:  Discontinued     1 g 200 mL/hr over 30 Minutes Intravenous Every 24 hours 01/08/18 1947 01/09/18 1525   01/09/18 1700  meropenem (MERREM) 1 g in sodium chloride 0.9 % 100 mL IVPB  Status:  Discontinued     1 g 200 mL/hr over 30 Minutes Intravenous Every 12 hours 01/09/18 1537 01/11/18 1015   01/08/18 1830  cefTRIAXone (ROCEPHIN) 1 g in sodium chloride 0.9 % 100 mL IVPB     1 g 200 mL/hr over 30 Minutes Intravenous  Once 01/08/18 1828 01/08/18 1926      Current Facility-Administered Medications  Medication Dose Route Frequency Provider Last Rate Last Dose  . acetaminophen (TYLENOL) tablet 650 mg  650 mg Oral Q6H PRN Sherryll Burger, Pratik D, DO       Or  . acetaminophen (TYLENOL) suppository 650 mg  650 mg Rectal Q6H PRN Sherryll Burger, Pratik D, DO      . carvedilol (COREG) tablet 6.25 mg  6.25 mg Oral BID WC Johnson, Clanford L, MD   6.25 mg at  01/12/18 0933  . cefTRIAXone (ROCEPHIN) 2 g in sodium chloride 0.9 % 100 mL IVPB  2 g Intravenous Q24H Cleora Fleet, MD   Stopped at 01/11/18 1901  . cholecalciferol (VITAMIN D) tablet 2,000 Units  2,000 Units Oral Daily Maurilio Lovely D, DO   2,000 Units at 01/12/18 0932  . [START ON 06-Feb-2018] ciprofloxacin (CIPRO) tablet 250 mg  250 mg Oral BID Johnson, Clanford L, MD      . ipratropium-albuterol (DUONEB) 0.5-2.5 (3) MG/3ML nebulizer solution 3 mL  3 mL Nebulization Q6H PRN Sherryll Burger, Pratik D, DO   3 mL at 01/10/18 1807  . ipratropium-albuterol (DUONEB) 0.5-2.5 (3) MG/3ML nebulizer solution 3 mL  3 mL Nebulization TID Johnson, Clanford L, MD   3 mL at 01/12/18 0800  . levothyroxine (SYNTHROID, LEVOTHROID) tablet 50 mcg  50 mcg Oral QAC breakfast Maurilio Lovely D, DO   50 mcg at 01/12/18 0932  . mometasone-formoterol (DULERA) 200-5 MCG/ACT inhaler 2 puff  2 puff Inhalation BID Sherryll Burger, Pratik D, DO   2 puff at 01/12/18 0800  . ondansetron (ZOFRAN) tablet 4 mg  4 mg Oral Q6H PRN Sherryll Burger, Pratik D, DO   4 mg at 01/09/18 2145   Or  . ondansetron [facility]) injection  4 mg  4 mg Intravenous Q6H PRN Sherryll BurgerShah, Pratik D, DO      . oxyCODONE-acetaminophen (PERCOCET/ROXICET) 5-325 MG per tablet 1-2 tablet  1-2 tablet Oral Q4H PRN Schorr, Roma KayserKatherine P, NP   2 tablet at 01/12/18 0940  . simvastatin (ZOCOR) tablet 20 mg  20 mg Oral q1800 Sherryll BurgerShah, Pratik D, DO   20 mg at 01/11/18 1751  . triamcinolone cream (KENALOG) 0.1 % 1 application  1 application Topical BID Maurilio LovelyShah, Pratik D, DO   1 application at 01/12/18 0935     Objective: Vital signs in last 24 hours: Temp:  [98.6 F (37 C)-99.3 F (37.4 C)] 98.6 F (37 C) (06/28 0530) Pulse Rate:  [87-93] 87 (06/28 0530) Resp:  [18] 18 (06/27 1504) BP: (119-131)/(52-74) 119/52 (06/28 0530) SpO2:  [92 %-97 %] 92 % (06/28 0800) Weight:  [122 kg (269 lb)] 122 kg (269 lb) (06/27 1521)  Intake/Output from previous day: 06/27 0701 - 06/28 0700 In: 820 [P.O.:720; IV  Piggyback:100] Out: 4300 [Urine:4300] Intake/Output this shift: No intake/output data recorded.   Physical Exam  Constitutional:  Obese, WD WF, ill appearing but in NAD.   Vitals reviewed.   Lab Results:  Recent Labs    01/11/18 0422 01/12/18 0459  WBC 9.6 11.5*  HGB 9.4* 9.5*  HCT 30.7* 30.9*  PLT 106* 134*   BMET Recent Labs    01/11/18 0422 01/12/18 0459  NA 137 139  K 4.3 4.3  CL 103 104  CO2 27 29  GLUCOSE 153* 114*  BUN 46* 40*  CREATININE 1.60* 1.34*  CALCIUM 8.1* 8.5*   PT/INR No results for input(s): LABPROT, INR in the last 72 hours. ABG No results for input(s): PHART, HCO3 in the last 72 hours.  Invalid input(s): PCO2, PO2  Studies/Results: Dg Abd 1 View  Result Date: 01/12/2018 CLINICAL DATA:  Right abdominal pain EXAM: ABDOMEN - 1 VIEW COMPARISON:  01/08/2018 FINDINGS: Right double-J ureteral stent is in place. Several calculi project over the proximal right ureter and right renal pelvis. The stomach is distended. There is otherwise no disproportionate dilatation of bowel. No obvious free intraperitoneal gas. Severe degenerative change of the hip joints. IMPRESSION: Right nephrolithiasis. Gastric distention. Electronically Signed   By: Jolaine ClickArthur  Hoss M.D.   On: 01/12/2018 13:24   Dg Chest Port 1 View  Result Date: 01/11/2018 CLINICAL DATA:  Cough EXAM: PORTABLE CHEST 1 VIEW COMPARISON:  01/09/2018 FINDINGS: Cardiac shadow remains enlarged. Aortic calcifications are again seen. Increasing vascular congestion and mild pulmonary edema is noted particularly on the right. No bony abnormality is noted. IMPRESSION: Increasing vascular congestion and pulmonary edema. Electronically Signed   By: Alcide CleverMark  Lukens M.D.   On: 01/11/2018 10:24   I have reviewed her KUB, recent notes and labs.   Assessment and Plan: She has multiple small stones about 6-78mm in size that are primarily in the RUP and and renal pelvis around the stent.   The stone burden exceeds what I  would consider favorable for ureteroscopic management and if that was done, it would require multiple procedures most likely.   She could have ESWL but with the stone burden that also would be likely to require more than a single procedure and with either ESWL and URS it would be unlikely that she would be completely stone free.   The most effective way to eliminate her stones would be PCNL but that would require a general anesthetic in the prone position and with her pulmonary issues that  could be problematic.  Also her body habitus will make PCNL more difficult to perform with a long skin to kidney distance.  She remains symptomatic with right flank pain but is improving on antibiotic therapy.  I would like to have her return to see me in the office in 2 weeks for reevaluation and she will need medical clearance prior to any intervention.        LOS: 4 days    Bjorn Pippin 01/12/2018 [phone] ID: Jennifer Rasmussen, female   DOB: 1950-10-25, 67 y.o.   MRN: [id number]

## 2018-01-12 NOTE — Progress Notes (Addendum)
PROGRESS NOTE  Sosha Shepherd [facility]  ZOX:[id number]  DOB: 21-Jan-1951  DOA: 01/08/2018 PCP: Junie Spencer, FNP  Brief Admission Hx: Jennifer Rasmussen is a 67 y.o. female with medical history significant for type 2 diabetes, morbid obesity, hypothyroidism, COPD, OSA on 3 L nasal cannula at night, hypertension, dyslipidemia, and GERD who began having some lower right-sided abdominal pain that began on Friday of last week. Chest x-ray demonstrates mild cardiomegaly with pulmonary vascular congestion and CT of the abdomen and pelvis with contrast is remarkable for a 7 mm right UPJ stone with hydroureteronephrosis.  Of note, she also has some cholelithiasis, but no symptoms of cholecystitis.  She has been started on some IV fluids as well as Rocephin empirically.  Urology, Dr. Wilson Singer has been contacted who will come to this facility for placement of right-sided renal stent   MDM/Assessment & Plan:   1. E coli Sepsis secondary to UTI with nephrolithiasis and right-sided UPJ obstruction.  Urology for cystoscopy and right ureteral stent placement tonight.  Treated with Rocephin.  Blood cultures (+) E coli.  Lactic acid trended down.  Ambulate with PT. DC foley when ambulating. Transition to oral ciprofloxacin.  2. AKI likely secondary to above with dehydration.  slightly improved.   avoid nephrotoxic agents.  Follow renal function panel. 3. Pulmonary edema - Treated with IV lasix.  Pt is diuresed 2.9L.  4. COPD with OSA.  Wears 3 L O2 at baseline at night.  Duonebs as needed.  Continue Symbicort. 5. Type 2 diabetes.  Holding all insulin and antihyperglycemics while patient is hypoglycemic.  6. Leukocytosis - resolved now.  WBC trending down. Following.   7. Hypothyroidism.  Continue Synthroid. 8. Hypoglycemia - started patient on a D10 infusion, monitoring CBG closely.  Advancing to soft diet.   9. Dyslipidemia.  Continue statin. 10. GERD.  PPI. 11. Hypertension.  Holding lisinopril and carvedilol due to  soft blood pressure readings.  DVT prophylaxis: SCDs Code Status: Full Family Communication: Husband and stepdaughter at bedside Consults called: Urology Disposition: Pt needs SNF placement  Consultants:  Urology  Procedures:  Right ureteral stent placement  Subjective: Pt not getting out of bed, c/o of back pain, requiring lots of support with ambulation  Objective: Vitals:   01/11/18 2021 01/11/18 2049 01/12/18 0530 01/12/18 0800  BP:  121/74 (!) 119/52   Pulse:  93 87   Resp:      Temp:  98.8 F (37.1 C) 98.6 F (37 C)   TempSrc:  Oral Oral   SpO2: 95% 94% 94% 92%  Weight:      Height:        Intake/Output Summary (Last 24 hours) at 01/12/2018 1101 Last data filed at 01/11/2018 2306 Gross per 24 hour  Intake 580 ml  Output 3000 ml  Net -2420 ml   Filed Weights   01/09/18 0500 01/10/18 0500 01/11/18 1521  Weight: 119.5 kg (263 lb 7.2 oz) 122.7 kg (270 lb 8.1 oz) 122 kg (269 lb)   REVIEW OF SYSTEMS  As per history otherwise all reviewed and reported negative  Exam:  General exam: awake, alert, NAD. Cooperative.  Respiratory system: exp wheezes heard. No increased work of breathing. Cardiovascular system: S1 & S2 heard.  Gastrointestinal system: Abdomen is nondistended, soft and nontender. Normal bowel sounds heard. Central nervous system: Alert and oriented. No focal neurological deficits. Extremities: no cyanosis.  Data Reviewed: Basic Metabolic Panel: Recent Labs  Lab 01/08/18 1714 01/09/18 0407 01/10/18 8119 01/11/18 0422 01/12/18  0459  NA 136 136 137 137 139  K 4.9 4.3 4.3 4.3 4.3  CL 97* 102 103 103 104  CO2 27 25 26 27 29   GLUCOSE 157* 77 72 153* 114*  BUN 57* 55* 55* 46* 40*  CREATININE 2.77* 2.51* 2.13* 1.60* 1.34*  CALCIUM 8.3* 7.5* 7.8* 8.1* 8.5*  MG  --   --  1.7 1.8 1.7  PHOS  --   --  3.9 3.3 2.8   Liver Function Tests: Recent Labs  Lab 01/08/18 1714 01/10/18 0504 01/11/18 0422 01/12/18 0459  AST 15  --   --   --     ALT 11*  --   --   --   ALKPHOS 114  --   --   --   BILITOT 0.9  --   --   --   PROT 6.8  --   --   --   ALBUMIN 2.5* 1.9* 1.8* 1.8*   Recent Labs  Lab 01/08/18 1714  LIPASE 12   No results for input(s): AMMONIA in the last 168 hours. CBC: Recent Labs  Lab 01/08/18 1714 01/09/18 0407 01/10/18 0504 01/11/18 0422 01/12/18 0459  WBC 18.3* 13.1* 10.2 9.6 11.5*  NEUTROABS 17.2*  --  7.9* 6.7 8.0*  HGB 11.9* 9.9* 9.5* 9.4* 9.5*  HCT 37.5 31.0* 30.9* 30.7* 30.9*  MCV 77.3* 77.1* 78.0 78.1 77.8*  PLT 132* 104* 104* 106* 134*   Cardiac Enzymes: Recent Labs  Lab 01/08/18 1714  TROPONINI <0.03   CBG (last 3)  Recent Labs    01/11/18 2051 01/12/18 0309 01/12/18 0826  GLUCAP 186* 117* 118*   Recent Results (from the past 240 hour(s))  Culture, blood (routine x 2)     Status: Abnormal   Collection Time: 01/08/18  5:15 PM  Result Value Ref Range Status   Specimen Description   Final    BLOOD RIGHT ANTECUBITAL Performed at [facility], [address] [place], [place] [address]    Special Requests   Final    BOTTLES DRAWN AEROBIC AND ANAEROBIC Blood Culture adequate volume Performed at [facility], [address], [place], [place] [address]    Culture  Setup Time   Final    GRAM NEGATIVE RODS AEROBIC AND ANAEROBIC BOTTLES Gram Stain Report Called to,Read Back By and Verified With: MURPHY E. AT 0832A ON [id number] BY THOMPSON S    Culture ESCHERICHIA COLI (A)  Final   Report Status 01/11/2018 FINAL  Final   Organism ID, Bacteria ESCHERICHIA COLI  Final      Susceptibility   Escherichia coli - MIC*    AMPICILLIN 8 SENSITIVE Sensitive     CEFAZOLIN <=4 SENSITIVE Sensitive     CEFEPIME <=1 SENSITIVE Sensitive     CEFTAZIDIME <=1 SENSITIVE Sensitive     CEFTRIAXONE <=1 SENSITIVE Sensitive     CIPROFLOXACIN <=0.25 SENSITIVE Sensitive     GENTAMICIN <=1 SENSITIVE Sensitive     IMIPENEM <=0.25 SENSITIVE Sensitive     TRIMETH/SULFA <=20 SENSITIVE Sensitive      AMPICILLIN/SULBACTAM 4 SENSITIVE Sensitive     PIP/TAZO <=4 SENSITIVE Sensitive     Extended ESBL NEGATIVE Sensitive     * ESCHERICHIA COLI  Blood Culture ID Panel (Reflexed)     Status: Abnormal   Collection Time: 01/08/18  5:15 PM  Result Value Ref Range Status   Enterococcus species NOT DETECTED NOT DETECTED Final   Listeria monocytogenes NOT DETECTED NOT DETECTED Final   Staphylococcus  species NOT DETECTED NOT DETECTED Final   Staphylococcus aureus NOT DETECTED NOT DETECTED Final   Streptococcus species NOT DETECTED NOT DETECTED Final   Streptococcus agalactiae NOT DETECTED NOT DETECTED Final   Streptococcus pneumoniae NOT DETECTED NOT DETECTED Final   Streptococcus pyogenes NOT DETECTED NOT DETECTED Final   Acinetobacter baumannii NOT DETECTED NOT DETECTED Final   Enterobacteriaceae species DETECTED (A) NOT DETECTED Final    Comment: Enterobacteriaceae represent a large family of gram-negative bacteria, not a single organism. CRITICAL RESULT CALLED TO, READ BACK BY AND VERIFIED WITH: Ermalene Searing PharmD 15:20 01/09/18 ([email or web address])    Enterobacter cloacae complex NOT DETECTED NOT DETECTED Final   Escherichia coli DETECTED (A) NOT DETECTED Final    Comment: CRITICAL RESULT CALLED TO, READ BACK BY AND VERIFIED WITH: Ermalene Searing PharmD 15:20 01/09/18 ([email or web address])    Klebsiella oxytoca NOT DETECTED NOT DETECTED Final   Klebsiella pneumoniae NOT DETECTED NOT DETECTED Final   Proteus species NOT DETECTED NOT DETECTED Final   Serratia marcescens NOT DETECTED NOT DETECTED Final   Carbapenem resistance NOT DETECTED NOT DETECTED Final   Haemophilus influenzae NOT DETECTED NOT DETECTED Final   Neisseria meningitidis NOT DETECTED NOT DETECTED Final   Pseudomonas aeruginosa NOT DETECTED NOT DETECTED Final   Candida albicans NOT DETECTED NOT DETECTED Final   Candida glabrata NOT DETECTED NOT DETECTED Final   Candida krusei NOT DETECTED NOT DETECTED Final   Candida parapsilosis NOT DETECTED NOT DETECTED  Final   Candida tropicalis NOT DETECTED NOT DETECTED Final  Culture, blood (routine x 2)     Status: Abnormal   Collection Time: 01/08/18  5:21 PM  Result Value Ref Range Status   Specimen Description   Final    LEFT ANTECUBITAL Performed at [facility] [address] [place], [place] [address]    Special Requests   Final    BOTTLES DRAWN AEROBIC AND ANAEROBIC Blood Culture adequate volume Performed at [facility], [address], [place], [place] [address]    Culture  Setup Time   Final    GRAM NEGATIVE RODS AEROBIC AND ANAEROBIC BOTTLES Gram Stain Report Called to,Read Back By and Verified With: MURPHY E. AT 0832A ON [id number] BY THOMPSON S. Performed at [facility], [address] [place], [place] [address]    Culture (A)  Final    ESCHERICHIA COLI SUSCEPTIBILITIES PERFORMED ON PREVIOUS CULTURE WITHIN THE LAST 5 DAYS. Performed at [facility], [address], [place], [place] [address]    Report Status 01/11/2018 FINAL  Final  Urine culture     Status: Abnormal   Collection Time: 01/08/18  7:00 PM  Result Value Ref Range Status   Specimen Description   Final    URINE, CATHETERIZED Performed at [facility], [address] [place], [place] [address]    Special Requests   Final    NONE Performed at [facility], [address] [place], [place] [address]    Culture (A)  Final    <10,000 COLONIES/mL INSIGNIFICANT GROWTH Performed at [facility], [address] [place], [place] [address]    Report Status 01/10/2018 FINAL  Final  MRSA PCR Screening     Status: None   Collection Time: 01/08/18 10:59 PM  Result Value Ref Range Status   MRSA by PCR NEGATIVE NEGATIVE Final    Comment:        The GeneXpert MRSA Assay (FDA approved for NASAL specimens only), is one component of a comprehensive MRSA colonization surveillance  program. It is not intended to diagnose MRSA infection nor to guide or monitor treatment for MRSA infections. Performed at [facility], [address] [place], [place] [id number]      Studies: [facility]  Result Date: 01/11/2018 CLINICAL DATA:  Cough EXAM: PORTABLE CHEST 1 VIEW COMPARISON:  01/09/2018 FINDINGS: Cardiac shadow remains enlarged. Aortic calcifications are again seen. Increasing vascular congestion and mild pulmonary edema is noted particularly on the right. No bony abnormality is noted. IMPRESSION: Increasing vascular congestion and pulmonary edema. Electronically Signed   By: Alcide CleverMark  Lukens M.D.   On: 01/11/2018 10:24   Scheduled Meds: . carvedilol  6.25 mg Oral BID WC  . cholecalciferol  2,000 Units Oral Daily  . ipratropium-albuterol  3 mL Nebulization TID  . levothyroxine  50 mcg Oral QAC breakfast  . mometasone-formoterol  2 puff Inhalation BID  . simvastatin  20 mg Oral q1800  . triamcinolone cream  1 application Topical BID   Continuous Infusions: . cefTRIAXone (ROCEPHIN)  IV Stopped (01/11/18 1901)    Principal Problem:   Sepsis (HCC) Active Problems:   Renal stone   Hypoglycemia   Hypothyroidism   Morbid obesity (HCC)   GERD (gastroesophageal reflux disease)   Hyperlipidemia associated with type 2 diabetes mellitus (HCC)   COPD (chronic obstructive pulmonary disease) (HCC)  Standley Dakinslanford Johnson, MD, FAAFP [facility] Pager [phone]  If 7PM-7AM, please contact night-coverage [email or web address] Password [email or web address] 01/12/2018, 11:01 AM    LOS: 4 days

## 2018-01-12 NOTE — Progress Notes (Signed)
Patient ID: Jennifer Rasmussen, female   DOB: 08-05-1950, 67 y.o.   MRN: [id number]   I have ordered a KUB to aid decision making about subsequent therapy and will round on [name] mid day today.

## 2018-01-13 ENCOUNTER — Emergency Department (HOSPITAL_COMMUNITY): Payer: Medicare Other

## 2018-01-13 ENCOUNTER — Emergency Department (HOSPITAL_COMMUNITY)
Admission: EM | Admit: 2018-01-13 | Discharge: 2018-01-15 | Disposition: E | Payer: Medicare Other | Attending: Emergency Medicine | Admitting: Emergency Medicine

## 2018-01-13 ENCOUNTER — Encounter (HOSPITAL_COMMUNITY): Payer: Self-pay | Admitting: Emergency Medicine

## 2018-01-13 DIAGNOSIS — I10 Essential (primary) hypertension: Secondary | ICD-10-CM | POA: Diagnosis not present

## 2018-01-13 DIAGNOSIS — Z7982 Long term (current) use of aspirin: Secondary | ICD-10-CM | POA: Diagnosis not present

## 2018-01-13 DIAGNOSIS — Z79899 Other long term (current) drug therapy: Secondary | ICD-10-CM | POA: Diagnosis not present

## 2018-01-13 DIAGNOSIS — E039 Hypothyroidism, unspecified: Secondary | ICD-10-CM | POA: Insufficient documentation

## 2018-01-13 DIAGNOSIS — Z96653 Presence of artificial knee joint, bilateral: Secondary | ICD-10-CM | POA: Insufficient documentation

## 2018-01-13 DIAGNOSIS — J9601 Acute respiratory failure with hypoxia: Secondary | ICD-10-CM

## 2018-01-13 DIAGNOSIS — R0602 Shortness of breath: Secondary | ICD-10-CM | POA: Diagnosis not present

## 2018-01-13 DIAGNOSIS — J449 Chronic obstructive pulmonary disease, unspecified: Secondary | ICD-10-CM | POA: Diagnosis not present

## 2018-01-13 DIAGNOSIS — E119 Type 2 diabetes mellitus without complications: Secondary | ICD-10-CM | POA: Diagnosis not present

## 2018-01-13 DIAGNOSIS — I469 Cardiac arrest, cause unspecified: Secondary | ICD-10-CM | POA: Insufficient documentation

## 2018-01-13 DIAGNOSIS — F17210 Nicotine dependence, cigarettes, uncomplicated: Secondary | ICD-10-CM | POA: Insufficient documentation

## 2018-01-13 LAB — CBC WITH DIFFERENTIAL/PLATELET
BASOS ABS: 0.3 10*3/uL — AB (ref 0.0–0.1)
Basophils Relative: 1 %
EOS ABS: 0.3 10*3/uL (ref 0.0–0.7)
Eosinophils Relative: 1 %
HEMATOCRIT: 29 % — AB (ref 36.0–46.0)
HEMOGLOBIN: 8.7 g/dL — AB (ref 12.0–15.0)
LYMPHS ABS: 8.8 10*3/uL — AB (ref 0.7–4.0)
LYMPHS PCT: 29 %
MCH: 23.9 pg — ABNORMAL LOW (ref 26.0–34.0)
MCHC: 30 g/dL (ref 30.0–36.0)
MCV: 79.7 fL (ref 78.0–100.0)
MONOS PCT: 7 %
Monocytes Absolute: 2.1 10*3/uL — ABNORMAL HIGH (ref 0.1–1.0)
NEUTROS ABS: 19 10*3/uL — AB (ref 1.7–7.7)
Neutrophils Relative %: 62 %
Platelets: ADEQUATE 10*3/uL (ref 150–400)
RBC: 3.64 MIL/uL — AB (ref 3.87–5.11)
RDW: 16.7 % — AB (ref 11.5–15.5)
WBC: 30.5 10*3/uL — ABNORMAL HIGH (ref 4.0–10.5)

## 2018-01-13 LAB — BRAIN NATRIURETIC PEPTIDE: B NATRIURETIC PEPTIDE 5: 200 pg/mL — AB (ref 0.0–100.0)

## 2018-01-13 LAB — CBG MONITORING, ED: GLUCOSE-CAPILLARY: 287 mg/dL — AB (ref 70–99)

## 2018-01-13 LAB — TROPONIN I

## 2018-01-13 MED ORDER — PHENYLEPHRINE 40 MCG/ML (10ML) SYRINGE FOR IV PUSH (FOR BLOOD PRESSURE SUPPORT): 120.0000 ug | PREFILLED_SYRINGE | Freq: Once | INTRAVENOUS | Status: DC

## 2018-01-13 MED ORDER — SODIUM CHLORIDE 0.9 % IV BOLUS
1000.0000 mL | Freq: Once | INTRAVENOUS | Status: AC
  Administered 2018-01-13: 50 mL via INTRAVENOUS

## 2018-01-13 MED ORDER — EPINEPHRINE PF 1 MG/10ML IJ SOSY
PREFILLED_SYRINGE | INTRAMUSCULAR | Status: AC | PRN
  Administered 2018-01-13 (×2): 1 mg via INTRAVENOUS

## 2018-01-13 MED ORDER — CALCIUM CHLORIDE 10 % IV SOLN
INTRAVENOUS | Status: AC | PRN
  Administered 2018-01-13: 1 g via INTRAVENOUS

## 2018-01-13 MED ORDER — EPINEPHRINE PF 1 MG/10ML IJ SOSY
PREFILLED_SYRINGE | INTRAMUSCULAR | Status: AC | PRN
  Administered 2018-01-13: 1 mg via INTRAVENOUS

## 2018-01-13 MED ORDER — PHENYLEPHRINE 200 MCG/ML FOR PRIAPISM / HYPOTENSION
INTRAMUSCULAR | Status: AC
  Administered 2018-01-13: 100 ug
  Filled 2018-01-13: qty 50

## 2018-01-13 MED ORDER — ETOMIDATE 2 MG/ML IV SOLN
INTRAVENOUS | Status: AC | PRN
  Administered 2018-01-13: 20 mg via INTRAVENOUS

## 2018-01-13 MED ORDER — ROCURONIUM BROMIDE 50 MG/5ML IV SOLN
INTRAVENOUS | Status: AC | PRN
  Administered 2018-01-13: 80 mg via INTRAVENOUS

## 2018-01-14 ENCOUNTER — Other Ambulatory Visit: Payer: Self-pay | Admitting: Family

## 2018-01-14 DIAGNOSIS — E1169 Type 2 diabetes mellitus with other specified complication: Secondary | ICD-10-CM

## 2018-01-14 DIAGNOSIS — E1165 Type 2 diabetes mellitus with hyperglycemia: Secondary | ICD-10-CM

## 2018-01-14 DIAGNOSIS — M1990 Unspecified osteoarthritis, unspecified site: Secondary | ICD-10-CM

## 2018-01-14 DIAGNOSIS — J441 Chronic obstructive pulmonary disease with (acute) exacerbation: Secondary | ICD-10-CM

## 2018-01-14 DIAGNOSIS — E785 Hyperlipidemia, unspecified: Principal | ICD-10-CM

## 2018-01-15 ENCOUNTER — Other Ambulatory Visit: Payer: Self-pay | Admitting: Family

## 2018-01-15 NOTE — ED Notes (Signed)
RT called for bipap

## 2018-01-15 NOTE — Code Documentation (Signed)
Pt has copious amounts of blood coming from mouth.  Continuous suction.

## 2018-01-15 NOTE — ED Triage Notes (Signed)
[facility].  Was d/c yesterday after being septic from UTI and 7mm renal stone.  Was hydrated and also diuresed during hospital stay.  Pt presents with increased difficulty breathing and swelling since leaving hospital.

## 2018-01-15 NOTE — Code Documentation (Signed)
Compressions paused for pulse check.  Asystole on monitor, verified in 2 leads.  Code called.

## 2018-01-15 NOTE — Code Documentation (Signed)
Pt continues to be pulseless.  CPR continues.

## 2018-01-15 NOTE — Code Documentation (Signed)
Patient time of death occurred at 3:49 PM

## 2018-01-15 NOTE — ED Notes (Signed)
Patient continues to decompensate. Decision to place patient on Bipap to assist with breathing.

## 2018-01-15 NOTE — ED Notes (Signed)
Patient vomiting up dark red blood in large amounts and continue to decompensate. Dr. Rubin PayorPickering at beside. Patient will be intubated.

## 2018-01-15 NOTE — ED Provider Notes (Signed)
[facility] EMERGENCY DEPARTMENT Provider Note   CSN: [id number] Arrival date & time: February 06, 2018  1505     History   Chief Complaint Chief Complaint  Patient presents with  . Shortness of Breath    HPI Jennifer Rasmussen is a 67 y.o. female.  HPI Level 5 caveat due to urgent need for intervention. Patient brought in from nursing home.  Reportedly discharged from the hospital yesterday.  Had had some renal insufficiency and sepsis after UTI and stone.  Had a stent placed.  Discharged to the nursing home yesterday.  Reportedly was hypoxic for EMS arrival.  EMS stated they were unable to get a blood pressure on her however.  Patient is complaining some abdominal pain but is in respiratory distress.  BiPAP immediately started.  EMS reports that the nursing home did not have any of her medicines and she has been without medicine since she left.  EMS also stated there was no other patients on the same medicines that they were able to take for this patient until her prescriptions got filled. Past Medical History:  Diagnosis Date  . COPD (chronic obstructive pulmonary disease) with chronic bronchitis (HCC)   . Diabetes mellitus, type 2 (HCC)   . DJD (degenerative joint disease)   . GERD (gastroesophageal reflux disease)   . H pylori ulcer   . Hyperlipidemia   . Hypertension   . Hypothyroidism   . Sleep apnea    Uses O2 at 3.5 liters at night  . Thyroid disease     Patient Active Problem List   Diagnosis Date Noted  . Hypoglycemia 01/09/2018  . COPD (chronic obstructive pulmonary disease) (HCC) 01/08/2018  . Renal stone 01/08/2018  . Sepsis (HCC) 01/08/2018  . Current smoker 08/09/2016  . Insomnia 04/19/2016  . Diabetic peripheral neuropathy (HCC) 04/19/2016  . GERD (gastroesophageal reflux disease) 09/22/2014  . Vitamin D deficiency 09/22/2014  . Hyperlipidemia associated with type 2 diabetes mellitus (HCC) 09/22/2014  . COPD exacerbation (HCC) 10/24/2012  . Hypertension  associated with diabetes (HCC) 10/24/2012  . Diabetes mellitus (HCC) 10/24/2012  . Hypothyroidism 10/24/2012  . Morbid obesity (HCC) 10/24/2012  . Peripheral edema 10/24/2012  . Arthritis 04/22/2008  . LOW BACK PAIN 04/22/2008    Past Surgical History:  Procedure Laterality Date  . CYSTOSCOPY WITH STENT PLACEMENT Right 01/08/2018   Procedure: CYSTOSCOPY WITH STENT PLACEMENT;  Surgeon: Bjorn Pippin, MD;  Location: [facility];  Service: Urology;  Laterality: Right;  . LAPAROSCOPIC GASTRIC BANDING    . REPLACEMENT TOTAL KNEE BILATERAL Bilateral   . TUBAL LIGATION       OB History   None      Home Medications    Prior to Admission medications   Medication Sig Start Date End Date Taking? Authorizing Provider  albuterol (PROVENTIL) (2.5 MG/3ML) 0.083% nebulizer solution INHALE 1 VIAL VIA NEBULIZER EVERY 6 HOURS AS NEEDED FOR WHEEZING OR SHORTNESS OF  BREATH. 06/20/17   Jannifer Rodney A, FNP  aspirin 81 MG tablet Take 81 mg by mouth daily.      [provider]  budesonide-formoterol (SYMBICORT) 160-4.5 MCG/ACT inhaler USE 2 INHALATIONS TWICE A  DAY 08/30/17   Jannifer Rodney A, FNP  carvedilol (COREG) 12.5 MG tablet Take 1 tablet (12.5 mg total) by mouth 2 (two) times daily with a meal. 08/30/17   Hawks, Neysa Bonito A, FNP  Cholecalciferol (VITAMIN D) 2000 UNITS tablet Take 2,000 Units by mouth daily.      [provider]  ciprofloxacin (  CIPRO) 250 MG tablet Take 1 tablet (250 mg total) by mouth 2 (two) times daily for 12 days. 01/12/18 01/24/18  Johnson, Clanford L, MD  ipratropium-albuterol (DUONEB) 0.5-2.5 (3) MG/3ML SOLN Take 3 mLs by nebulization 4 (four) times daily as needed.      [provider]  levothyroxine (SYNTHROID, LEVOTHROID) 50 MCG tablet Take 1 tablet (50 mcg total) by mouth daily. 08/30/17   Jannifer Rodney A, FNP  oxyCODONE-acetaminophen (PERCOCET/ROXICET) 5-325 MG tablet Take 1-2 tablets by mouth every 6 (six) hours as needed for severe pain. 01/12/18    Johnson, Clanford L, MD  ranitidine (ZANTAC) 150 MG tablet Take 150 mg by mouth daily.      [provider]  simvastatin (ZOCOR) 20 MG tablet TAKE 1 TABLET BY MOUTH  DAILY AT 6PM 08/30/17   Junie Spencer, FNP    Family History Family History  Problem Relation Age of Onset  . Multiple sclerosis Mother   . Heart disease Father   . Stroke Father   . Arthritis Father   . Diabetes Sister   . Arthritis Sister   . Diabetes Brother   . Arthritis Brother   . Diabetes Sister   . Arthritis Sister     Social History Social History   Tobacco Use  . Smoking status: Current Every Day Smoker    Packs/day: 0.50    Years: 30.00    Pack years: 15.00    Types: Cigarettes  . Smokeless tobacco: Never Used  Substance Use Topics  . Alcohol use: No  . Drug use: No     Allergies   Patient has no known allergies.   Review of Systems Review of Systems  Unable to perform ROS: Acuity of condition     Physical Exam Updated Vital Signs BP (!) 85/77   Pulse (!) 112   Resp 19   Ht 5\' 4"  (1.626 m)   Wt 122 kg (269 lb)   LMP 10/24/2001   SpO2 (!) 76%   BMI 46.17 kg/m   Physical Exam  Constitutional: She appears well-developed.  HENT:  Head: Atraumatic.  Eyes: Pupils are equal, round, and reactive to light.  Neck: Neck supple.  Cardiovascular:  Tachycardia  Pulmonary/Chest:  Decreased breath sounds on left with some wheezes on right.  Respiratory distress on BiPAP.  Abdominal: There is tenderness.  Musculoskeletal:       Right lower leg: She exhibits edema.       Left lower leg: She exhibits edema.  Mild edema bilateral lower extremities.  Neurological:  Patient is in respiratory distress.  Able answer some questions but some confusion.  Skin: Skin is warm.     ED Treatments / Results  Labs (all labs ordered are listed, but only abnormal results are displayed) Labs Reviewed  CBC WITH DIFFERENTIAL/PLATELET - Abnormal; Notable for the following components:       Result Value   WBC 30.5 (*)    RBC 3.64 (*)    Hemoglobin 8.7 (*)    HCT 29.0 (*)    MCH 23.9 (*)    RDW 16.7 (*)    Neutro Abs 19.0 (*)    Lymphs Abs 8.8 (*)    Monocytes Absolute 2.1 (*)    Basophils Absolute 0.3 (*)    All other components within normal limits  BRAIN NATRIURETIC PEPTIDE - Abnormal; Notable for the following components:   B Natriuretic Peptide 200.0 (*)    All other components within normal limits  CBG MONITORING, ED -  Abnormal; Notable for the following components:   Glucose-Capillary 287 (*)    All other components within normal limits  TROPONIN I  URINALYSIS, ROUTINE W REFLEX MICROSCOPIC  I-STAT CHEM 8, ED  I-STAT CG4 LACTIC ACID, ED  I-STAT CG4 LACTIC ACID, ED    EKG None  Radiology Dg Abd 1 View  Result Date: 01/12/2018 CLINICAL DATA:  Right abdominal pain EXAM: ABDOMEN - 1 VIEW COMPARISON:  01/08/2018 FINDINGS: Right double-J ureteral stent is in place. Several calculi project over the proximal right ureter and right renal pelvis. The stomach is distended. There is otherwise no disproportionate dilatation of bowel. No obvious free intraperitoneal gas. Severe degenerative change of the hip joints. IMPRESSION: Right nephrolithiasis. Gastric distention. Electronically Signed   By: Jolaine Click M.D.   On: 01/12/2018 13:24   Dg Chest Portable 1 View  Result Date: January 28, 2018 CLINICAL DATA:  Shortness of breath EXAM: PORTABLE CHEST 1 VIEW COMPARISON:  January 11, 2018 FINDINGS: The study is marred by motion. No pneumothorax. The cardiomediastinal silhouette is stable. Improved pulmonary edema. There may be mild remaining edema. IMPRESSION: Improved pulmonary edema.  No other interval changes. Electronically Signed   By: Gerome Sam III M.D   On: 28-Jan-2018 16:02    Procedures Procedure Name: Intubation Date/Time: 2018/01/28 4:24 PM Performed by: Benjiman Core, MD Oxygen Delivery Method: Non-rebreather mask Preoxygenation: Pre-oxygenation with 100%  oxygen Induction Type: Rapid sequence Laryngoscope Size: Glidescope and 4 Grade View: Grade III Tube type: Subglottic suction tube Tube size: 7.5 mm Number of attempts: 1 Placement Confirmation: ETT inserted through vocal cords under direct vision,  Positive ETCO2 and Breath sounds checked- equal and bilateral Secured at: 25 cm Comments: Patient had a large amount of blood in oropharynx.      (including critical care time)  Medications Ordered in ED Medications  PHENYLephrine 40 mcg/ml in normal saline Adult IV Push Syringe (has no administration in time range)  sodium chloride 0.9 % bolus 1,000 mL (50 mLs Intravenous New Bag/Given 01-28-18 1537)  PHENYLEPHRINE 200 MCG/ML FOR PRIAPISM / HYPOTENSION 200 mcg/ml injection SOLN (100 mcg  Given Jan 28, 2018 1537)  EPINEPHrine (ADRENALIN) 1 MG/10ML injection (1 mg Intravenous Given 01/28/2018 1544)  etomidate (AMIDATE) injection (20 mg Intravenous Given 01/28/18 1539)  rocuronium (ZEMURON) injection (80 mg Intravenous Given 2018-01-28 1538)  calcium chloride injection (1 g Intravenous Given 01-28-2018 1543)  EPINEPHrine (ADRENALIN) 1 MG/10ML injection (1 mg Intravenous Given 01-28-2018 1547)     Initial Impression / Assessment and Plan / ED Course  I have reviewed the triage vital signs and the nursing notes.  Pertinent labs & imaging results that were available during my care of the patient were reviewed by me and considered in my medical decision making (see chart for details).     Patient presented with respiratory distress and mental status changes.  Also abdominal pain.  Blood pressure is hypotensive upon arrival.  Fluid bolus given.  BiPAP started upon arrival to the ER.  However has had some blood coming out of her mouth and vomited.  Decision was made to intubate.  Patient became more bradycardic and decreased mental status while arranging for intubation.  Patient went into cardiac arrest.  Was intubated by myself.  Had a large amount of watery  blood in mouth.  May have been a GI source but somewhat difficult to get due to the amount of it.  Is able to suction and intubate her. Equal breath sounds after intubation and good color change.  However  had no return of pulse.  Given epinephrine and a dose of calcium.  Had had some renal failure on the hospital.  Rectal temperature was 100.2 prior to coding.  Time of death [id number].  I notified the patient's family.  Patient's white count has increased this could be due to the overall illness or return of her sepsis.  Also had what appeared to be GI bleeding.  This also potentially could have been an inciting factor for the death.  Also with worsening respiratory function could have been a cause.  Discussed with medical examiner Mariann LasterJustin Stewart.  Potentially a ME case.    Final Clinical Impressions(s) / ED Diagnoses   Final diagnoses:  Cardiac arrest [facility](HCC)    ED Discharge Orders    None       Benjiman CorePickering, Lethia Donlon, MD 11/03/2017 1727

## 2018-01-15 DEATH — deceased

## 2018-01-16 MED FILL — Medication: Qty: 1 | Status: AC

## 2018-01-18 NOTE — Progress Notes (Signed)
Acute pulmonary edema

## 2018-03-02 ENCOUNTER — Ambulatory Visit: Payer: Medicare Other | Admitting: Family

## 2019-02-08 IMAGING — CR DG CHEST 1V PORT
1 series · 1 of 1 positions shown · non-contrast
Comparison: January 11, 2018

CLINICAL DATA: Shortness of breath

EXAM:
PORTABLE CHEST 1 VIEW

[portable]
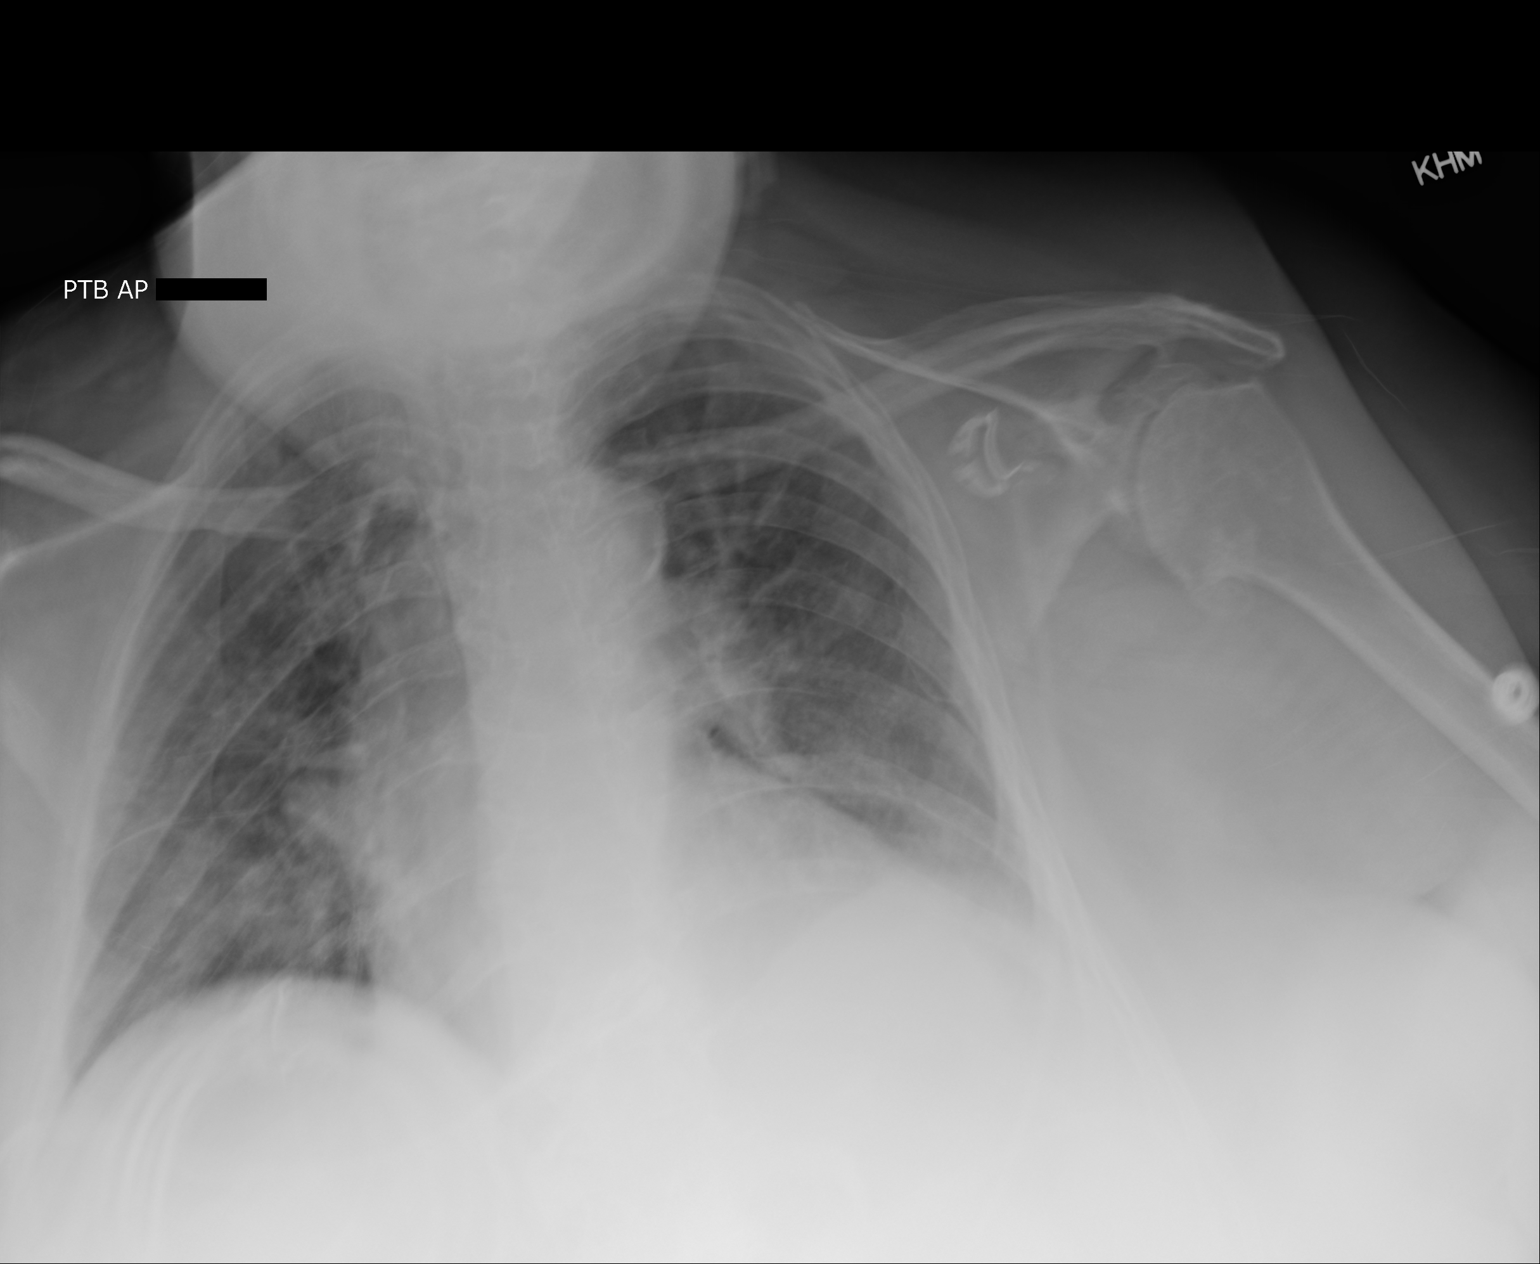

[1 of 1 positions shown; findings below may reference images not displayed]

FINDINGS: The study is marred by motion. No pneumothorax. The
cardiomediastinal silhouette is stable. Improved pulmonary edema.
There may be mild remaining edema.
IMPRESSION: Improved pulmonary edema.  No other interval changes.
